# Patient Record
Sex: Female | Born: 1955 | Race: White | Hispanic: No | Smoking: Former smoker
Health system: Southern US, Community
[De-identification: ages and names within clinical notes are randomized; demographics above are authoritative.]

## PROBLEM LIST (undated history)

## (undated) DIAGNOSIS — E11610 Type 2 diabetes mellitus with diabetic neuropathic arthropathy: Secondary | ICD-10-CM

## (undated) DIAGNOSIS — F329 Major depressive disorder, single episode, unspecified: Secondary | ICD-10-CM

## (undated) DIAGNOSIS — I10 Essential (primary) hypertension: Secondary | ICD-10-CM

## (undated) DIAGNOSIS — Z800 Family history of malignant neoplasm of digestive organs: Secondary | ICD-10-CM

## (undated) DIAGNOSIS — F419 Anxiety disorder, unspecified: Secondary | ICD-10-CM

## (undated) DIAGNOSIS — Z860100 Personal history of colon polyps, unspecified: Secondary | ICD-10-CM

## (undated) DIAGNOSIS — Z8049 Family history of malignant neoplasm of other genital organs: Secondary | ICD-10-CM

## (undated) DIAGNOSIS — I639 Cerebral infarction, unspecified: Secondary | ICD-10-CM

## (undated) DIAGNOSIS — E785 Hyperlipidemia, unspecified: Secondary | ICD-10-CM

## (undated) DIAGNOSIS — F32A Depression, unspecified: Secondary | ICD-10-CM

## (undated) DIAGNOSIS — Z803 Family history of malignant neoplasm of breast: Secondary | ICD-10-CM

## (undated) DIAGNOSIS — J449 Chronic obstructive pulmonary disease, unspecified: Secondary | ICD-10-CM

## (undated) HISTORY — PX: COLONOSCOPY: SHX174

## (undated) HISTORY — DX: Family history of malignant neoplasm of breast: Z80.3

## (undated) HISTORY — PX: BILATERAL CARPAL TUNNEL RELEASE: SHX6508

## (undated) HISTORY — PX: ECTOPIC PREGNANCY SURGERY: SHX613

## (undated) HISTORY — DX: Chronic obstructive pulmonary disease, unspecified: J44.9

## (undated) HISTORY — DX: Depression, unspecified: F32.A

## (undated) HISTORY — DX: Major depressive disorder, single episode, unspecified: F32.9

## (undated) HISTORY — DX: Personal history of colonic polyps: Z86.010

## (undated) HISTORY — DX: Essential (primary) hypertension: I10

## (undated) HISTORY — DX: Family history of malignant neoplasm of digestive organs: Z80.0

## (undated) HISTORY — DX: Hyperlipidemia, unspecified: E78.5

## (undated) HISTORY — DX: Family history of malignant neoplasm of other genital organs: Z80.49

## (undated) HISTORY — DX: Type 2 diabetes mellitus with diabetic neuropathic arthropathy: E11.610

## (undated) HISTORY — DX: Anxiety disorder, unspecified: F41.9

## (undated) HISTORY — DX: Personal history of colon polyps, unspecified: Z86.0100

## (undated) HISTORY — DX: Cerebral infarction, unspecified: I63.9

---

## 2002-12-07 ENCOUNTER — Other Ambulatory Visit: Payer: Self-pay

## 2003-02-11 ENCOUNTER — Other Ambulatory Visit: Payer: Self-pay

## 2003-05-18 ENCOUNTER — Other Ambulatory Visit: Payer: Self-pay

## 2003-10-22 ENCOUNTER — Ambulatory Visit: Payer: Self-pay | Admitting: Anesthesiology

## 2003-11-29 ENCOUNTER — Ambulatory Visit: Payer: Self-pay | Admitting: Internal Medicine

## 2003-12-03 ENCOUNTER — Ambulatory Visit: Payer: Self-pay | Admitting: *Deleted

## 2003-12-05 ENCOUNTER — Ambulatory Visit: Payer: Self-pay | Admitting: Anesthesiology

## 2003-12-31 ENCOUNTER — Ambulatory Visit: Payer: Self-pay | Admitting: Anesthesiology

## 2004-01-16 ENCOUNTER — Ambulatory Visit: Payer: Self-pay | Admitting: Anesthesiology

## 2004-01-21 ENCOUNTER — Emergency Department: Payer: Self-pay | Admitting: General Practice

## 2004-01-28 ENCOUNTER — Ambulatory Visit: Payer: Self-pay | Admitting: Anesthesiology

## 2004-02-19 ENCOUNTER — Inpatient Hospital Stay: Payer: Self-pay | Admitting: Unknown Physician Specialty

## 2005-07-29 ENCOUNTER — Ambulatory Visit: Payer: Self-pay | Admitting: Nurse Practitioner

## 2005-10-27 ENCOUNTER — Ambulatory Visit: Payer: Self-pay | Admitting: Nurse Practitioner

## 2005-12-08 ENCOUNTER — Ambulatory Visit: Payer: Self-pay | Admitting: Gastroenterology

## 2006-02-16 ENCOUNTER — Ambulatory Visit: Payer: Self-pay | Admitting: Nurse Practitioner

## 2006-03-10 ENCOUNTER — Ambulatory Visit: Payer: Self-pay | Admitting: Nurse Practitioner

## 2006-05-24 ENCOUNTER — Emergency Department: Payer: Self-pay | Admitting: Emergency Medicine

## 2006-05-24 ENCOUNTER — Other Ambulatory Visit: Payer: Self-pay

## 2006-07-26 ENCOUNTER — Emergency Department: Payer: Self-pay | Admitting: Emergency Medicine

## 2006-07-26 ENCOUNTER — Other Ambulatory Visit: Payer: Self-pay

## 2006-07-28 ENCOUNTER — Ambulatory Visit: Payer: Self-pay | Admitting: Emergency Medicine

## 2007-06-20 ENCOUNTER — Emergency Department: Payer: Self-pay | Admitting: Internal Medicine

## 2007-10-01 ENCOUNTER — Emergency Department: Payer: Self-pay | Admitting: Emergency Medicine

## 2007-12-29 ENCOUNTER — Ambulatory Visit: Payer: Self-pay | Admitting: Family Medicine

## 2008-03-14 ENCOUNTER — Ambulatory Visit: Payer: Self-pay | Admitting: Neurology

## 2008-05-16 ENCOUNTER — Emergency Department: Payer: Self-pay | Admitting: Emergency Medicine

## 2009-11-27 ENCOUNTER — Ambulatory Visit: Payer: Self-pay | Admitting: Family Medicine

## 2009-12-17 ENCOUNTER — Emergency Department: Payer: Self-pay | Admitting: Emergency Medicine

## 2010-04-04 ENCOUNTER — Ambulatory Visit: Payer: Self-pay | Admitting: Anesthesiology

## 2010-04-29 ENCOUNTER — Emergency Department: Payer: Self-pay | Admitting: Emergency Medicine

## 2011-02-13 ENCOUNTER — Ambulatory Visit: Payer: Self-pay | Admitting: Family Medicine

## 2011-09-03 ENCOUNTER — Ambulatory Visit: Payer: Self-pay | Admitting: Family Medicine

## 2011-11-25 ENCOUNTER — Ambulatory Visit: Payer: Self-pay | Admitting: Family Medicine

## 2011-12-02 ENCOUNTER — Ambulatory Visit: Payer: Self-pay | Admitting: Family Medicine

## 2012-03-23 ENCOUNTER — Ambulatory Visit: Payer: Self-pay | Admitting: Family Medicine

## 2013-03-18 ENCOUNTER — Emergency Department: Payer: Self-pay | Admitting: Emergency Medicine

## 2013-05-16 ENCOUNTER — Ambulatory Visit: Payer: Self-pay | Admitting: Family Medicine

## 2013-08-14 ENCOUNTER — Emergency Department: Payer: Self-pay | Admitting: Emergency Medicine

## 2013-08-14 LAB — CBC
HCT: 41.9 % (ref 35.0–47.0)
HGB: 14 g/dL (ref 12.0–16.0)
MCH: 30.5 pg (ref 26.0–34.0)
MCHC: 33.4 g/dL (ref 32.0–36.0)
MCV: 91 fL (ref 80–100)
Platelet: 285 10*3/uL (ref 150–440)
RBC: 4.59 10*6/uL (ref 3.80–5.20)
RDW: 12.7 % (ref 11.5–14.5)
WBC: 7.3 10*3/uL (ref 3.6–11.0)

## 2013-08-14 LAB — TROPONIN I

## 2013-08-14 LAB — BASIC METABOLIC PANEL
Anion Gap: 4 — ABNORMAL LOW (ref 7–16)
BUN: 11 mg/dL (ref 7–18)
CALCIUM: 8.5 mg/dL (ref 8.5–10.1)
Chloride: 109 mmol/L — ABNORMAL HIGH (ref 98–107)
Co2: 28 mmol/L (ref 21–32)
Creatinine: 1.01 mg/dL (ref 0.60–1.30)
Glucose: 120 mg/dL — ABNORMAL HIGH (ref 65–99)
Osmolality: 282 (ref 275–301)
POTASSIUM: 4.2 mmol/L (ref 3.5–5.1)
Sodium: 141 mmol/L (ref 136–145)

## 2013-12-29 ENCOUNTER — Emergency Department: Payer: Self-pay | Admitting: Emergency Medicine

## 2014-07-02 ENCOUNTER — Encounter: Payer: Self-pay | Admitting: Cardiovascular Disease

## 2014-07-02 ENCOUNTER — Encounter (INDEPENDENT_AMBULATORY_CARE_PROVIDER_SITE_OTHER): Payer: Self-pay

## 2014-07-02 ENCOUNTER — Ambulatory Visit (INDEPENDENT_AMBULATORY_CARE_PROVIDER_SITE_OTHER): Payer: Medicare Other | Admitting: Cardiovascular Disease

## 2014-07-02 VITALS — BP 104/78 | HR 70 | Ht 64.5 in | Wt 203.5 lb

## 2014-07-02 DIAGNOSIS — R079 Chest pain, unspecified: Secondary | ICD-10-CM | POA: Diagnosis not present

## 2014-07-02 DIAGNOSIS — E785 Hyperlipidemia, unspecified: Secondary | ICD-10-CM | POA: Diagnosis not present

## 2014-07-02 DIAGNOSIS — I208 Other forms of angina pectoris: Secondary | ICD-10-CM | POA: Diagnosis not present

## 2014-07-02 DIAGNOSIS — I10 Essential (primary) hypertension: Secondary | ICD-10-CM | POA: Insufficient documentation

## 2014-07-02 DIAGNOSIS — I209 Angina pectoris, unspecified: Secondary | ICD-10-CM

## 2014-07-02 NOTE — Assessment & Plan Note (Signed)
Blood pressure is well controlled 

## 2014-07-02 NOTE — Patient Instructions (Addendum)
Medication Instructions:  Your physician recommends that you continue on your current medications as directed. Please refer to the Current Medication list given to you today.   Labwork: none  Testing/Procedures: Your physician has requested that you have a lexiscan myoview.   [place]  Your caregiver has ordered a Stress Test with nuclear imaging. The purpose of this test is to evaluate the blood supply to your heart muscle. This procedure is referred to as a "Non-Invasive Stress Test." This is because other than having an IV started in your vein, nothing is inserted or "invades" your body. Cardiac stress tests are done to find areas of poor blood flow to the heart by determining the extent of coronary artery disease (CAD). Some patients exercise on a treadmill, which naturally increases the blood flow to your heart, while others who are  unable to walk on a treadmill due to physical limitations have a pharmacologic/chemical stress agent called Lexiscan . This medicine will mimic walking on a treadmill by temporarily increasing your coronary blood flow.   Please note: these test may take anywhere between 2-4 hours to complete  PLEASE REPORT TO [place] TO GO  Date of Procedure:Thursday, June 23 at 8:30am Arrival Time for Procedure:  8:00am   PLEASE NOTIFY THE OFFICE AT LEAST 24 HOURS IN ADVANCE IF YOU ARE UNABLE TO KEEP YOUR APPOINTMENT.  [phone] AND  PLEASE NOTIFY NUCLEAR MEDICINE AT [facility] AT LEAST 24 HOURS IN ADVANCE IF YOU ARE UNABLE TO KEEP YOUR APPOINTMENT. [phone]  How to prepare for your Myoview test:   Do not eat or drink after midnight  No caffeine for 24 hours prior to test  No smoking 24 hours prior to test.  Your medication may be taken with water.  If your doctor stopped a medication because of this test, do not take that medication.  Ladies, please do not wear dresses.  Skirts or  pants are appropriate. Please wear a short sleeve shirt.  No perfume, cologne or lotion.  Wear comfortable walking shoes. No heels!            Follow-Up: Your physician recommends that you schedule a follow-up appointment in: one month with Dr. Fletcher Anon.    Any Other Special Instructions Will Be Listed Below (If Applicable).    Nuclear Medicine Exam A nuclear medicine exam is a safe and painless imaging test. It helps to detect and diagnose disease in the body as well as provide information about organ function and structure.  Nuclear scans are most often done of the:  Lungs.  Heart.  Thyroid gland.  Bones.  Abdomen. HOW A NUCLEAR MEDICINE EXAM WORKS A nuclear medicine exam works by using a radioactive tracer. The material is given either by an IV (intravenous) injection or it may be swallowed. After the tracer is in the body, it is absorbed by your body's organs. A large scanning machine that uses a special camera detects the radioactivity in your body. A computerized image is then formed regarding the area of concern. The small amounts of radioactive material used in a nuclear medicine exam are found to be medically safe. However, because radioactive material is used, this test is not done if you are pregnant or nursing.  BEFORE THE PROCEDURE  If available, bring previous imaging studies such as x-rays, etc. with you to the exam.  Arrive early for your exam. PROCEDURE  An IV may be started before  the exam begins.  Depending on the type of examination, will lie on a table or sit in a chair during the exam.  The nuclear medicine exam will take about 30 to 60 minutes to complete. AFTER THE PROCEDURE  After your scan is completed, the image(s) will be evaluated by a specialist. It is important that you follow up with your caregiver to find out your test results.  You may return to your regular activity as instructed by your caregiver. SEEK IMMEDIATE MEDICAL CARE  IF: You have shortness of breath or difficulty breathing. MAKE SURE YOU:   Understand these instructions.  Will watch your condition.  Will get help right away if you are not doing well or get worse. Document Released: 02/06/2004 Document Revised: 03/23/2011 Document Reviewed: 03/22/2008 [facility] Patient Information 2015 [place], [place]. This information is not intended to replace advice given to you by your health care provider. Make sure you discuss any questions you have with your health care provider.

## 2014-07-02 NOTE — Progress Notes (Signed)
Primary care physician: Dr. Lennox Grumbles  HPI  This is a pleasant 59 year old female who was referred for evaluation of chest pain. She has no previous cardiac history. However, she has multiple chronic medical conditions that include diet-controlled diabetes, tobacco use, COPD, hyperlipidemia, hypertension and GERD. She had previous right knee replacement. She smokes half a pack per day and has been doing so for at least 37 years. She has no family history of coronary artery disease. Over the last 1-2 months, she had experienced recurrent episodes of substernal chest pressure with no radiation. This mainly happens when she is doing regular activities such as working in her garden. It lasts for about 5-10 minutes and resolve with rest. She has no symptoms at rest. This has been associated with increased shortness of breath. No orthopnea, PND or lower extremity edema. She reports having a stress test many years ago but none recently.  No Known Allergies   No current outpatient prescriptions on file prior to visit.   No current facility-administered medications on file prior to visit.     Past Medical History  Diagnosis Date  . Hyperlipidemia   . Stroke   . Hypertension   . Anxiety   . Depression   . COPD (chronic obstructive pulmonary disease)   . 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 Ectopic pregnancy surgery    . Bilateral carpal tunnel release       Family History  Problem Relation Age of Onset  . Hypertension Mother   . Hyperlipidemia Mother      History   Social History  . Marital Status: Widowed    Spouse Name: N/A  . Number of Children: N/A  . Years of Education: N/A   Occupational History  . Not on file.   Social History Main Topics  . Smoking status: Current Every Day Smoker -- 0.50 packs/day for 37 years    Types: Cigarettes  . Smokeless tobacco: Not on file  . Alcohol Use: Yes   Comment: Rare.   . Drug Use: No  . Sexual Activity: Not on file   Other Topics Concern  . Not on file   Social History Narrative  . No narrative on file     ROS A 10 point review of system was performed. It is negative other than that mentioned in the history of present illness.   PHYSICAL EXAM   BP 104/78 mmHg  Pulse 70  Ht 5' 4.5" (1.638 m)  Wt 203 lb 8 oz (92.307 kg)  BMI 34.40 kg/m2 Constitutional: She is oriented to person, place, and time. She appears well-developed and well-nourished. No distress.  HENT: No nasal discharge.  Head: Normocephalic and atraumatic.  Eyes: Pupils are equal and round. No discharge.  Neck: Normal range of motion. Neck supple. No JVD present. No thyromegaly present.  Cardiovascular: Normal rate, regular rhythm, normal heart sounds. Exam reveals no gallop and no friction rub. No murmur heard.  Pulmonary/Chest: Effort normal and breath sounds normal. No stridor. No respiratory distress. She has no wheezes. She has no rales. She exhibits no tenderness.  Abdominal: Soft. Bowel sounds are normal. She exhibits no distension. There is no tenderness. There is no rebound and no guarding.  Musculoskeletal: Normal range of motion. She exhibits no edema and no tenderness.  Neurological: She is alert and oriented to person, place, and time. Coordination normal.  Skin: Skin is warm and dry. No rash  noted. She is not diaphoretic. No erythema. No pallor.  Psychiatric: She has a normal mood and affect. Her behavior is normal. Judgment and thought content normal.     EKG: Sinus  Rhythm  WITHIN NORMAL LIMITS   ASSESSMENT AND PLAN

## 2014-07-02 NOTE — Assessment & Plan Note (Signed)
Continue treatment with atorvastatin with a target LDL of less than 100 and if CAD is established, then recommend a target of less than 70.

## 2014-07-02 NOTE — Assessment & Plan Note (Signed)
The patient's symptoms of exertional chest pressure is highly suggestive of class III angina. She has multiple risk factors for coronary artery disease. Baseline ECG is normal. I recommended evaluation with a pharmacologic nuclear stress test. She is not able to fully exercise on a treadmill due to osteoarthritis and previous right knee replacement. Treatment with a beta blocker or long-acting nitroglycerin can be considered after getting the results of stress test. Continue low-dose aspirin.

## 2014-07-05 ENCOUNTER — Ambulatory Visit
Admission: RE | Admit: 2014-07-05 | Discharge: 2014-07-05 | Disposition: A | Discharge status: Home / Self Care | Payer: Medicare Other | Source: Ambulatory Visit | Attending: Cardiovascular Disease | Admitting: Cardiovascular Disease

## 2014-07-05 DIAGNOSIS — R079 Chest pain, unspecified: Secondary | ICD-10-CM | POA: Insufficient documentation

## 2014-07-05 LAB — NM MYOCAR MULTI W/SPECT W/WALL MOTION / EF
CHL CUP RESTING HR STRESS: 59 {beats}/min
LV dias vol: 55 mL
LV sys vol: 19 mL
Peak HR: 95 {beats}/min
Percent HR: 58 %
SDS: 2
SRS: 5
SSS: 3
TID: 0.9

## 2014-07-05 MED ORDER — TECHNETIUM TC 99M SESTAMIBI - CARDIOLITE
10.0000 | Freq: Once | INTRAVENOUS | Status: AC | PRN
  Administered 2014-07-05: 08:00:00 13.37 via INTRAVENOUS

## 2014-07-05 MED ORDER — TECHNETIUM TC 99M SESTAMIBI - CARDIOLITE
31.2000 | Freq: Once | INTRAVENOUS | Status: AC | PRN
  Administered 2014-07-05: 09:00:00 31.2 via INTRAVENOUS

## 2014-07-05 MED ORDER — REGADENOSON 0.4 MG/5ML IV SOLN
0.4000 mg | Freq: Once | INTRAVENOUS | Status: AC
  Administered 2014-07-05: 0.4 mg via INTRAVENOUS

## 2014-07-06 ENCOUNTER — Other Ambulatory Visit: Payer: Self-pay

## 2014-07-06 ENCOUNTER — Other Ambulatory Visit: Payer: Self-pay | Admitting: Family Medicine

## 2014-07-06 DIAGNOSIS — Z1231 Encounter for screening mammogram for malignant neoplasm of breast: Secondary | ICD-10-CM

## 2014-07-06 MED ORDER — ENALAPRIL MALEATE 10 MG PO TABS: 10.0000 mg | ORAL_TABLET | Freq: Every day | ORAL | Status: DC

## 2014-07-06 MED ORDER — CARVEDILOL 3.125 MG PO TABS: 3.1250 mg | ORAL_TABLET | Freq: Two times a day (BID) | ORAL | Status: DC

## 2014-07-09 ENCOUNTER — Ambulatory Visit
Admission: RE | Admit: 2014-07-09 | Discharge: 2014-07-09 | Disposition: A | Discharge status: Home / Self Care | Payer: Medicare Other | Source: Ambulatory Visit | Attending: Family Medicine | Admitting: Family Medicine

## 2014-07-09 DIAGNOSIS — Z1231 Encounter for screening mammogram for malignant neoplasm of breast: Secondary | ICD-10-CM | POA: Insufficient documentation

## 2014-08-09 ENCOUNTER — Ambulatory Visit: Payer: Medicare Other | Admitting: Cardiovascular Disease

## 2014-08-23 ENCOUNTER — Ambulatory Visit
Admission: RE | Admit: 2014-08-23 | Discharge: 2014-08-23 | Disposition: A | Discharge status: Home / Self Care | Payer: Medicare Other | Source: Ambulatory Visit | Attending: Family Medicine | Admitting: Family Medicine

## 2014-08-23 ENCOUNTER — Other Ambulatory Visit: Payer: Self-pay | Admitting: Family Medicine

## 2014-08-23 DIAGNOSIS — J449 Chronic obstructive pulmonary disease, unspecified: Secondary | ICD-10-CM

## 2014-08-23 DIAGNOSIS — M546 Pain in thoracic spine: Secondary | ICD-10-CM

## 2014-08-23 DIAGNOSIS — M549 Dorsalgia, unspecified: Secondary | ICD-10-CM | POA: Diagnosis present

## 2014-08-23 DIAGNOSIS — M5134 Other intervertebral disc degeneration, thoracic region: Secondary | ICD-10-CM | POA: Insufficient documentation

## 2014-09-17 ENCOUNTER — Emergency Department
Admission: EM | Admit: 2014-09-17 | Discharge: 2014-09-17 | Disposition: A | Discharge status: Home / Self Care | Payer: Medicare Other

## 2014-10-16 ENCOUNTER — Other Ambulatory Visit: Payer: Self-pay | Admitting: *Deleted

## 2014-10-16 MED ORDER — ENALAPRIL MALEATE 10 MG PO TABS: 10.0000 mg | ORAL_TABLET | Freq: Every day | ORAL | Status: DC

## 2015-03-11 ENCOUNTER — Telehealth: Payer: Self-pay | Admitting: Cardiovascular Disease

## 2015-03-11 NOTE — Telephone Encounter (Signed)
Lm with Sister about calling us back to make a fu

## 2015-05-07 ENCOUNTER — Ambulatory Visit (INDEPENDENT_AMBULATORY_CARE_PROVIDER_SITE_OTHER): Payer: Medicare Other

## 2015-05-07 ENCOUNTER — Ambulatory Visit (INDEPENDENT_AMBULATORY_CARE_PROVIDER_SITE_OTHER): Payer: Medicare Other | Admitting: Podiatry

## 2015-05-07 ENCOUNTER — Encounter: Payer: Self-pay | Admitting: Podiatry

## 2015-05-07 VITALS — BP 160/95 | HR 62 | Resp 18

## 2015-05-07 DIAGNOSIS — R52 Pain, unspecified: Secondary | ICD-10-CM | POA: Diagnosis not present

## 2015-05-07 DIAGNOSIS — M722 Plantar fascial fibromatosis: Secondary | ICD-10-CM

## 2015-05-07 DIAGNOSIS — L237 Allergic contact dermatitis due to plants, except food: Secondary | ICD-10-CM

## 2015-05-07 MED ORDER — METHYLPREDNISOLONE 4 MG PO TBPK: ORAL_TABLET | ORAL | Status: DC

## 2015-05-07 NOTE — Progress Notes (Signed)
   Subjective:    Patient ID: Jennifer Mosley, female    DOB: 12-Sep-1955, 60 y.o.   MRN: [id number]  HPI  60 year old female presents the office today for concerns of pain to both of her heels which is been ongoing for the last 2-3 months. She said that she has pain majority in the morning she first gets up if she is sitting up for sometime she gets back up. She denies any numbness or tingling. No swelling or redness. No recent injury or trauma. No previous treatment. No other complaints at this time.  She also states she has poison oak.    Review of Systems  All other systems reviewed and are negative.      Objective:   Physical Exam General: AAO x3, NAD  Dermatological: Erythematous lesions to bilateral feet which itch. She states that she has poison oak. Skin is warm, dry and supple bilateral. Nails x 10 are well manicured; remaining integument appears unremarkable at this time. There are no open sores, no preulcerative lesions, no rash or signs of infection present.  Vascular: Dorsalis Pedis artery and Posterior Tibial artery pedal pulses are 2/4 bilateral with immedate capillary fill time. Pedal hair growth present. No varicosities and no lower extremity edema present bilateral. There is no pain with calf compression, swelling, warmth, erythema.   Neruologic: Grossly intact via light touch bilateral. Vibratory intact via tuning fork bilateral. Protective threshold with Semmes Wienstein monofilament intact to all pedal sites bilateral. Patellar and Achilles deep tendon reflexes 2+ bilateral. No Babinski or clonus noted bilateral.   Musculoskeletal: Tenderness to palpation along the plantar medial tubercle of the calcaneus at the insertion of plantar fascia on the right > left foot. There is no pain along the course of the plantar fascia within the arch of the foot. Plantar fascia appears to be intact. There is no pain with lateral compression of the calcaneus or pain with vibratory  sensation. There is no pain along the course or insertion of the achilles tendon. No other areas of tenderness to bilateral lower extremities. MMT 5/5, ROM WNL Gait: Unassisted, Nonantalgic.      Assessment & Plan:  60 year old female bilateral heel pain right > left likely plantar fasciitis -Treatment options discussed including all alternatives, risks, and complications -Etiology of symptoms were discussed -X-rays were obtained and reviewed with the patient. No evidence of acute fracture or stress fracture.  -Patient elects to proceed with steroid injection into the left and right heel. Under sterile skin preparation, a total of 2.5cc of kenalog 10, 0.5% Marcaine plain, and 2% lidocaine plain were infiltrated into the symptomatic area without complication. A band-aid was applied. Patient tolerated the injection well without complication. Post-injection care with discussed with the patient. Discussed with the patient to ice the area over the next couple of days to help prevent a steroid flare.  -Plantar fascial brace bilaterally -Medrol dose pack -Ice -Stretching daily -Shoe gear changes -Follow-up in 3 weeks or sooner if any problems arise. In the meantime, encouraged to call the office with any questions, concerns, change in symptoms.   Celesta Gentile, DPM

## 2015-05-07 NOTE — Patient Instructions (Signed)

## 2015-05-16 ENCOUNTER — Emergency Department: Payer: Medicare Other

## 2015-05-16 ENCOUNTER — Inpatient Hospital Stay: Payer: Medicare Other

## 2015-05-16 ENCOUNTER — Inpatient Hospital Stay (HOSPITAL_COMMUNITY)
Admit: 2015-05-16 | Discharge: 2015-05-16 | Disposition: A | Discharge status: Home / Self Care | Payer: Medicare Other | Attending: Internal Medicine | Admitting: Internal Medicine

## 2015-05-16 ENCOUNTER — Encounter: Payer: Self-pay | Admitting: Emergency Medicine

## 2015-05-16 ENCOUNTER — Inpatient Hospital Stay
Admission: EM | Admit: 2015-05-16 | Discharge: 2015-05-17 | DRG: 069 | Disposition: A | Discharge status: Home / Self Care | Payer: Medicare Other | Attending: Internal Medicine | Admitting: Internal Medicine

## 2015-05-16 DIAGNOSIS — K219 Gastro-esophageal reflux disease without esophagitis: Secondary | ICD-10-CM | POA: Diagnosis present

## 2015-05-16 DIAGNOSIS — F329 Major depressive disorder, single episode, unspecified: Secondary | ICD-10-CM | POA: Diagnosis present

## 2015-05-16 DIAGNOSIS — R29898 Other symptoms and signs involving the musculoskeletal system: Secondary | ICD-10-CM

## 2015-05-16 DIAGNOSIS — F419 Anxiety disorder, unspecified: Secondary | ICD-10-CM | POA: Diagnosis present

## 2015-05-16 DIAGNOSIS — J449 Chronic obstructive pulmonary disease, unspecified: Secondary | ICD-10-CM | POA: Diagnosis present

## 2015-05-16 DIAGNOSIS — Z7952 Long term (current) use of systemic steroids: Secondary | ICD-10-CM | POA: Diagnosis not present

## 2015-05-16 DIAGNOSIS — E785 Hyperlipidemia, unspecified: Secondary | ICD-10-CM | POA: Diagnosis present

## 2015-05-16 DIAGNOSIS — Z803 Family history of malignant neoplasm of breast: Secondary | ICD-10-CM

## 2015-05-16 DIAGNOSIS — F17210 Nicotine dependence, cigarettes, uncomplicated: Secondary | ICD-10-CM | POA: Diagnosis present

## 2015-05-16 DIAGNOSIS — I10 Essential (primary) hypertension: Secondary | ICD-10-CM | POA: Diagnosis present

## 2015-05-16 DIAGNOSIS — R4781 Slurred speech: Secondary | ICD-10-CM | POA: Diagnosis not present

## 2015-05-16 DIAGNOSIS — Z7982 Long term (current) use of aspirin: Secondary | ICD-10-CM | POA: Diagnosis not present

## 2015-05-16 DIAGNOSIS — Z8249 Family history of ischemic heart disease and other diseases of the circulatory system: Secondary | ICD-10-CM

## 2015-05-16 DIAGNOSIS — M47812 Spondylosis without myelopathy or radiculopathy, cervical region: Secondary | ICD-10-CM | POA: Diagnosis present

## 2015-05-16 DIAGNOSIS — Z7902 Long term (current) use of antithrombotics/antiplatelets: Secondary | ICD-10-CM | POA: Diagnosis not present

## 2015-05-16 DIAGNOSIS — I6350 Cerebral infarction due to unspecified occlusion or stenosis of unspecified cerebral artery: Secondary | ICD-10-CM | POA: Diagnosis not present

## 2015-05-16 DIAGNOSIS — E1140 Type 2 diabetes mellitus with diabetic neuropathy, unspecified: Secondary | ICD-10-CM | POA: Diagnosis present

## 2015-05-16 DIAGNOSIS — Z79899 Other long term (current) drug therapy: Secondary | ICD-10-CM | POA: Diagnosis not present

## 2015-05-16 DIAGNOSIS — G459 Transient cerebral ischemic attack, unspecified: Secondary | ICD-10-CM | POA: Diagnosis present

## 2015-05-16 DIAGNOSIS — I639 Cerebral infarction, unspecified: Secondary | ICD-10-CM

## 2015-05-16 LAB — LIPID PANEL
CHOLESTEROL: 262 mg/dL — AB (ref 0–200)
HDL: 51 mg/dL (ref >40)
LDL Cholesterol: 184 mg/dL — ABNORMAL HIGH (ref 0–99)
Total CHOL/HDL Ratio: 5.1 RATIO
Triglycerides: 135 mg/dL (ref <150)
VLDL: 27 mg/dL (ref 0–40)

## 2015-05-16 LAB — COMPREHENSIVE METABOLIC PANEL
ALT: 200 U/L — ABNORMAL HIGH (ref 14–54)
ANION GAP: 9 (ref 5–15)
AST: 112 U/L — AB (ref 15–41)
Albumin: 4.1 g/dL (ref 3.5–5.0)
Alkaline Phosphatase: 148 U/L — ABNORMAL HIGH (ref 38–126)
BUN: 9 mg/dL (ref 6–20)
CHLORIDE: 105 mmol/L (ref 101–111)
CO2: 26 mmol/L (ref 22–32)
Calcium: 9.2 mg/dL (ref 8.9–10.3)
Creatinine, Ser: 0.99 mg/dL (ref 0.44–1.00)
Glucose, Bld: 109 mg/dL — ABNORMAL HIGH (ref 65–99)
POTASSIUM: 3.9 mmol/L (ref 3.5–5.1)
Sodium: 140 mmol/L (ref 135–145)
Total Bilirubin: 0.6 mg/dL (ref 0.3–1.2)
Total Protein: 7.6 g/dL (ref 6.5–8.1)

## 2015-05-16 LAB — CBC
HCT: 46.3 % (ref 35.0–47.0)
Hemoglobin: 15.8 g/dL (ref 12.0–16.0)
MCH: 31 pg (ref 26.0–34.0)
MCHC: 34.2 g/dL (ref 32.0–36.0)
MCV: 90.7 fL (ref 80.0–100.0)
PLATELETS: 216 10*3/uL (ref 150–440)
RBC: 5.11 MIL/uL (ref 3.80–5.20)
RDW: 12.9 % (ref 11.5–14.5)
WBC: 7.6 10*3/uL (ref 3.6–11.0)

## 2015-05-16 MED ORDER — ACETAMINOPHEN 325 MG PO TABS
650.0000 mg | ORAL_TABLET | Freq: Four times a day (QID) | ORAL | Status: DC | PRN
  Administered 2015-05-16: 650 mg via ORAL
  Filled 2015-05-16: qty 2

## 2015-05-16 MED ORDER — ACETAMINOPHEN 650 MG RE SUPP: 650.0000 mg | Freq: Four times a day (QID) | RECTAL | Status: DC | PRN

## 2015-05-16 MED ORDER — ASPIRIN 81 MG PO CHEW
324.0000 mg | CHEWABLE_TABLET | Freq: Once | ORAL | Status: AC
  Administered 2015-05-16: 324 mg via ORAL

## 2015-05-16 MED ORDER — ENALAPRIL MALEATE 5 MG PO TABS
10.0000 mg | ORAL_TABLET | Freq: Every day | ORAL | Status: DC
  Administered 2015-05-17: 08:00:00 10 mg via ORAL
  Filled 2015-05-16: qty 2

## 2015-05-16 MED ORDER — CARVEDILOL 3.125 MG PO TABS: 3.1250 mg | ORAL_TABLET | Freq: Two times a day (BID) | ORAL | Status: DC

## 2015-05-16 MED ORDER — HYDROCODONE-ACETAMINOPHEN 5-325 MG PO TABS
1.0000 | ORAL_TABLET | ORAL | Status: DC | PRN
  Administered 2015-05-17: 1 via ORAL
  Filled 2015-05-16: qty 1

## 2015-05-16 MED ORDER — ONDANSETRON HCL 4 MG/2ML IJ SOLN: 4.0000 mg | Freq: Four times a day (QID) | INTRAMUSCULAR | Status: DC | PRN

## 2015-05-16 MED ORDER — IOPAMIDOL (ISOVUE-370) INJECTION 76%
100.0000 mL | Freq: Once | INTRAVENOUS | Status: AC | PRN
  Administered 2015-05-16: 100 mL via INTRAVENOUS

## 2015-05-16 MED ORDER — ATORVASTATIN CALCIUM 20 MG PO TABS: 80.0000 mg | ORAL_TABLET | Freq: Every day | ORAL | Status: DC

## 2015-05-16 MED ORDER — ASPIRIN 81 MG PO CHEW
CHEWABLE_TABLET | ORAL | Status: AC
  Filled 2015-05-16: qty 4

## 2015-05-16 MED ORDER — ZOLPIDEM TARTRATE 5 MG PO TABS
5.0000 mg | ORAL_TABLET | Freq: Every evening | ORAL | Status: DC | PRN
  Filled 2015-05-16: qty 1

## 2015-05-16 MED ORDER — ONDANSETRON HCL 4 MG PO TABS: 4.0000 mg | ORAL_TABLET | Freq: Four times a day (QID) | ORAL | Status: DC | PRN

## 2015-05-16 MED ORDER — SENNOSIDES-DOCUSATE SODIUM 8.6-50 MG PO TABS: 1.0000 | ORAL_TABLET | Freq: Every evening | ORAL | Status: DC | PRN

## 2015-05-16 MED ORDER — TIOTROPIUM BROMIDE MONOHYDRATE 18 MCG IN CAPS
18.0000 ug | ORAL_CAPSULE | Freq: Every day | RESPIRATORY_TRACT | Status: DC
  Filled 2015-05-16: qty 5

## 2015-05-16 MED ORDER — ENOXAPARIN SODIUM 40 MG/0.4ML ~~LOC~~ SOLN
40.0000 mg | SUBCUTANEOUS | Status: DC
  Administered 2015-05-16: 40 mg via SUBCUTANEOUS
  Filled 2015-05-16: qty 0.4

## 2015-05-16 MED ORDER — PANTOPRAZOLE SODIUM 40 MG PO TBEC
40.0000 mg | DELAYED_RELEASE_TABLET | Freq: Every day | ORAL | Status: DC
  Administered 2015-05-17: 40 mg via ORAL
  Filled 2015-05-16: qty 1

## 2015-05-16 MED ORDER — CLOPIDOGREL BISULFATE 75 MG PO TABS
75.0000 mg | ORAL_TABLET | Freq: Every day | ORAL | Status: DC
  Administered 2015-05-16 – 2015-05-17 (×2): 75 mg via ORAL
  Filled 2015-05-16 (×2): qty 1

## 2015-05-16 NOTE — H&P (Signed)
[facility] [place] NAME: Jennifer Mosley    MR#:  [id number]  DATE OF BIRTH:  1955-07-17  DATE OF ADMISSION:  05/16/2015  PRIMARY CARE PHYSICIAN: [name], MD   REQUESTING/REFERRING PHYSICIAN:Dr Joni Fears  CHIEF COMPLAINT:   Slurred speech and facial droop HISTORY OF PRESENT ILLNESS:  Jennifer Mosley  is a 60 y.o. female with a known history of CVA, essential hypertension and anxiety presents with above complaint. Patient reports that approximately 11:00 this afternoon she was not feeling very well and she had an episode of nausea. She went outside to sit down and was trying to mow the lawn. At 1:00 her sister noticed facial droop and slurred speech. EMS was called and she was brought to the ER for further evaluation. When she arrived in the emergency room she still has slurred speech and facial droop. The symptoms have now resolved. She is complaining of a slight headache.  PAST MEDICAL HISTORY:   Past Medical History  Diagnosis Date  . Stroke ([place])   . Hypertension   . Anxiety   . Depression   . COPD (chronic obstructive pulmonary disease) ([place])   . 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 Hyperlipidemia   . Essential hypertension     PAST SURGICAL HISTORY:   Past Surgical History  Procedure Laterality Date  . Ectopic pregnancy surgery    . Bilateral carpal tunnel release      SOCIAL HISTORY:   Social History  Substance Use Topics  . Smoking status: Current Every Day Smoker -- 0.50 packs/day for 37 years    Types: Cigarettes  . Smokeless tobacco: No  . Alcohol Use: YesOccasionally on the weekends      Comment: Rare.     FAMILY HISTORY:   Family History  Problem Relation Age of Onset  . Hypertension Mother   . Hyperlipidemia Mother   . Breast cancer Cousin     DRUG ALLERGIES:  No Known Allergies  REVIEW OF SYSTEMS:   Review of Systems  Constitutional: Negative for fever, chills  and malaise/fatigue.  HENT: Negative for ear discharge, ear pain, hearing loss, nosebleeds and sore throat.   Eyes: Negative for blurred vision and pain.  Respiratory: Negative for cough, hemoptysis, shortness of breath and wheezing.   Cardiovascular: Negative for chest pain, palpitations and leg swelling.  Gastrointestinal: Negative for nausea, vomiting, abdominal pain, diarrhea and blood in stool.  Genitourinary: Negative for dysuria.  Musculoskeletal: Negative for back pain.  Neurological: Positive for speech change, focal weakness and headaches. Negative for dizziness, tremors and seizures.  Endo/Heme/Allergies: Does not bruise/bleed easily.  Psychiatric/Behavioral: Negative for depression, suicidal ideas and hallucinations.    MEDICATIONS AT HOME:   Prior to Admission medications   Medication Sig Start Date End Date Taking? Authorizing Provider  aspirin 81 MG tablet Take 81 mg by mouth daily.    Historical Provider, MD  atorvastatin (LIPITOR) 80 MG tablet Take 80 mg by mouth daily.    Historical Provider, MD  carvedilol (COREG) 3.125 MG tablet Take 1 tablet (3.125 mg total) by mouth 2 (two) times daily with a meal. 07/06/14   Wellington Hampshire, MD  cetirizine (ZYRTEC) 10 MG tablet Take 10 mg by mouth daily.    Historical Provider, MD  enalapril (VASOTEC) 10 MG tablet Take 1 tablet (10 mg total) by mouth daily. 10/16/14   Wellington Hampshire, MD  esomeprazole (NEXIUM) 40 MG capsule Take 40 mg by mouth  daily at 12 noon.    Historical Provider, MD  methylPREDNISolone (MEDROL DOSEPAK) 4 MG TBPK tablet Take as directed 05/07/15   Trula Slade, DPM  tiotropium (SPIRIVA) 18 MCG inhalation capsule Place 18 mcg into inhaler and inhale daily.    Historical Provider, MD      VITAL SIGNS:  Blood pressure 138/78, pulse 64, temperature 98.4 F (36.9 C), temperature source Oral, resp. rate 19, height 5\' 4"  (1.626 m), weight 89.359 kg (197 lb), SpO2 96 %.  PHYSICAL EXAMINATION:   Physical Exam   Constitutional: She is oriented to person, place, and time and well-developed, well-nourished, and in no distress. No distress.  HENT:  Head: Normocephalic.  Eyes: No scleral icterus.  Neck: Normal range of motion. Neck supple. No JVD present. No tracheal deviation present.  Cardiovascular: Normal rate, regular rhythm and normal heart sounds.  Exam reveals no gallop and no friction rub.   No murmur heard. Pulmonary/Chest: Effort normal and breath sounds normal. No respiratory distress. She has no wheezes. She has no rales. She exhibits no tenderness.  Abdominal: Soft. Bowel sounds are normal. She exhibits no distension and no mass. There is no tenderness. There is no rebound and no guarding.  Musculoskeletal: Normal range of motion. She exhibits no edema.  Left lower extremity 4-5  Neurological: She is alert and oriented to person, place, and time.  Skin: Skin is warm. No rash noted. No erythema.  Psychiatric: Affect and judgment normal.      LABORATORY PANEL:   CBC  Recent Labs Lab 05/16/15 1452  WBC 7.6  HGB 15.8  HCT 46.3  PLT 216   ------------------------------------------------------------------------------------------------------------------  Chemistries   Recent Labs Lab 05/16/15 1452  NA 140  K 3.9  CL 105  CO2 26  GLUCOSE 109*  BUN 9  CREATININE 0.99  CALCIUM 9.2  AST 112*  ALT 200*  ALKPHOS 148*  BILITOT 0.6   ------------------------------------------------------------------------------------------------------------------  Cardiac Enzymes No results for input(s): TROPONINI in the last 168 hours. ------------------------------------------------------------------------------------------------------------------  RADIOLOGY:  Ct Head Wo Contrast  05/16/2015  CLINICAL DATA:  Left-sided weakness for about 4 hours with slurred speech for 2 hours, symptoms have resolved but patient remains week EXAM: CT HEAD WITHOUT CONTRAST TECHNIQUE: Contiguous axial  images were obtained from the base of the skull through the vertex without intravenous contrast. COMPARISON:  05/24/2006 FINDINGS: There is no evidence of acute vascular territory infarct. There is no evidence of mass. There is no hydrocephalus. There is no hemorrhage or extra-axial fluid. Calvarium is intact. IMPRESSION: No acute abnormalities by CT. MRI may be considered to evaluate further if indicated. Critical Value/emergent results were called by telephone at the time of interpretation on 05/16/2015 at 3:19 pm to Dr. Lavonia Drafts , who verbally acknowledged these results. Electronically Signed   By: Skipper Cliche M.D.   On: 05/16/2015 15:18    EKG:   Sinus rhythm no ST elevation or depression.  IMPRESSION AND PLAN:    60 year old female with a history of stroke, essential hypertension and tobacco abuse who presents with slurred speech and left facial droop that has subsequently resolved.  1. Slurred speech with left facial droop: Symptoms are consistent with TIA. Patient will be admitted to hospital service to evaluate for stroke. MRI, carotid Dopplers and echocardiogram have been ordered. Aspirin has been changed to Plavix. Continue statin and check lipid panel. Neurology consultation has been placed.  Neuro checks every 4 hours. PT, OT and speech consultation.  2. Essential hypertension: Continue  Coreg and enalapril.  3. Hyperlipidemia: Continue atorvastatin and check lipid panel.   4. GERD: Continue PPI.  5. Tobacco dependence: Patient is strongly encouraged to stop smoking. Patient says she is trying to quit smoking. Patient counseled for 3 minutes. At this time she does not 1 nicotine patch.    All the records are reviewed and case discussed with ED provider. Management plans discussed with the patient and shein agreement  CODE STATUS: FULL  TOTAL TIME TAKING CARE OF THIS PATIENT: 50 minutes.    Donnetta Gillin M.D on 05/16/2015 at 4:25 PM  Between 7am to 6pm - Pager -  [phone]  After 6pm go to [email or web address] - password [email or web address] [facility]  Office  [phone]  CC: Primary care physician; [name], MD

## 2015-05-16 NOTE — Progress Notes (Signed)
*  PRELIMINARY RESULTS* Echocardiogram 2D Echocardiogram has been performed. A complete Echocardiogram was performed, test ordered incorrectly.   [place]ber 05/16/2015, 7:29 PM

## 2015-05-16 NOTE — ED Notes (Signed)
Pt states she threw up around 11am today and began feeling weakness at 11:30am.  Family members noticed stroke-like symptoms, including weakness, slurred speech, and Left facial draw at approximately 1300.  EMS reports that when fire department arrived to scene, the patient's stroke screen was negative.  On ED arrival, patient's weakness remained, other stroke symptoms had resolved.  Code stroke called.

## 2015-05-16 NOTE — Consult Note (Signed)
Referring Physician: Archie Balboa    Chief Complaint: Facial droop and weakness  HPI: Jennifer Mosley is an 60 y.o. female who began to feel bad earlier today around 1130am and had an episode of vomiting.  Then later in the afternoon around 1p she developed some slurred speech and facial droop.  She also experienced generalized weakness.  EMS was called.  When EMS arrived she had improved and her speech and facial droop was back to baseline.  Initial NIHSS of 1.  Date last known well: 05/16/2015 Time last known well: Time: 13:00 tPA Given: No: Improvement in symptoms  MRankin: 0  Past Medical History  Diagnosis Date  . Stroke ([place])   . Hypertension   . Anxiety   . Depression   . COPD (chronic obstructive pulmonary disease) ([place])   . 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 Hyperlipidemia   . Essential hypertension     Past Surgical History  Procedure Laterality Date  . Ectopic pregnancy surgery    . Bilateral carpal tunnel release      Family History  Problem Relation Age of Onset  . Hypertension Mother   . Hyperlipidemia Mother   . Breast cancer Cousin    Social History:  reports that she has been smoking Cigarettes.  She has a 18.5 pack-year smoking history. She does not have any smokeless tobacco history on file. She reports that she drinks alcohol. She reports that she does not use illicit drugs.  Allergies: No Known Allergies  Medications: I have reviewed the patient's current medications. Prior to Admission:  Prior to Admission medications   Medication Sig Start Date End Date Taking? Authorizing Provider  aspirin 81 MG tablet Take 81 mg by mouth daily.    Historical Provider, MD  atorvastatin (LIPITOR) 80 MG tablet Take 80 mg by mouth daily.    Historical Provider, MD  carvedilol (COREG) 3.125 MG tablet Take 1 tablet (3.125 mg total) by mouth 2 (two) times daily with a meal. 07/06/14   Wellington Hampshire, MD  cetirizine (ZYRTEC) 10 MG tablet Take  10 mg by mouth daily.    Historical Provider, MD  enalapril (VASOTEC) 10 MG tablet Take 1 tablet (10 mg total) by mouth daily. 10/16/14   Wellington Hampshire, MD  esomeprazole (NEXIUM) 40 MG capsule Take 40 mg by mouth daily at 12 noon.    Historical Provider, MD  methylPREDNISolone (MEDROL DOSEPAK) 4 MG TBPK tablet Take as directed 05/07/15   Trula Slade, DPM  tiotropium (SPIRIVA) 18 MCG inhalation capsule Place 18 mcg into inhaler and inhale daily.    Historical Provider, MD    ROS: History obtained from the patient  General ROS: negative for - chills, fatigue, fever, night sweats, weight gain or weight loss Psychological ROS: negative for - behavioral disorder, hallucinations, memory difficulties, mood swings or suicidal ideation Ophthalmic ROS: negative for - blurry vision, double vision, eye pain or loss of vision ENT ROS: negative for - epistaxis, nasal discharge, oral lesions, sore throat, tinnitus or vertigo Allergy and Immunology ROS: negative for - hives or itchy/watery eyes Hematological and Lymphatic ROS: negative for - bleeding problems, bruising or swollen lymph nodes Endocrine ROS: negative for - galactorrhea, hair pattern changes, polydipsia/polyuria or temperature intolerance Respiratory ROS: negative for - cough, hemoptysis, shortness of breath or wheezing Cardiovascular ROS: negative for - chest pain, dyspnea on exertion, edema or irregular heartbeat Gastrointestinal ROS: as noted in HPI Genito-Urinary ROS: negative for - dysuria, hematuria, incontinence  or urinary frequency/urgency Musculoskeletal ROS: negative for - joint swelling or muscular weakness Neurological ROS: as noted in HPI Dermatological ROS: negative for rash and skin lesion changes  Physical Examination: Blood pressure 138/78, pulse 64, temperature 98.4 F (36.9 C), temperature source Oral, resp. rate 19, height 5\' 4"  (1.626 m), weight 89.359 kg (197 lb), SpO2 96 %.  HEENT-  Normocephalic, no lesions,  without obvious abnormality.  Normal external eye and conjunctiva.  Normal TM's bilaterally.  Normal auditory canals and external ears. Normal external nose, mucus membranes and septum.  Normal pharynx. Cardiovascular- S1, S2 normal, pulses palpable throughout   Lungs- chest clear, no wheezing, rales, normal symmetric air entry Abdomen- soft, non-tender; bowel sounds normal; no masses,  no organomegaly Extremities- no edema Lymph-no adenopathy palpable Musculoskeletal-no joint tenderness, deformity or swelling Skin-warm and dry, no hyperpigmentation, vitiligo, or suspicious lesions  Neurological Examination Mental Status: Alert, oriented, thought content appropriate.  Speech fluent without evidence of aphasia.  Able to follow 3 step commands without difficulty. Cranial Nerves: II: Discs flat bilaterally; Visual fields grossly normal, pupils equal, round, reactive to light and accommodation III,IV, VI: ptosis not present, extra-ocular motions intact bilaterally V,VII: smile symmetric, facial light touch sensation normal bilaterally VIII: hearing normal bilaterally IX,X: gag reflex present XI: bilateral shoulder shrug XII: midline tongue extension Motor: Right : Upper extremity   5/5    Left:     Upper extremity   5/5  Lower extremity   5/5     Lower extremity   5-/5 Tone and bulk:normal tone throughout; no atrophy noted Sensory: Pinprick and light touch decreased in LLE Deep Tendon Reflexes: 2+ and symmetric throughout Plantars: Right: downgoing   Left: downgoing Cerebellar: Normal finger-to-nose and normal heel-to-shin testing bilaterally Gait: not tested due to safety concerns     Laboratory Studies:  Basic Metabolic Panel:  Recent Labs Lab 05/16/15 1452  NA 140  K 3.9  CL 105  CO2 26  GLUCOSE 109*  BUN 9  CREATININE 0.99  CALCIUM 9.2    Liver Function Tests:  Recent Labs Lab 05/16/15 1452  AST 112*  ALT 200*  ALKPHOS 148*  BILITOT 0.6  PROT 7.6  ALBUMIN  4.1   No results for input(s): LIPASE, AMYLASE in the last 168 hours. No results for input(s): AMMONIA in the last 168 hours.  CBC:  Recent Labs Lab 05/16/15 1452  WBC 7.6  HGB 15.8  HCT 46.3  MCV 90.7  PLT 216    Cardiac Enzymes: No results for input(s): CKTOTAL, CKMB, CKMBINDEX, TROPONINI in the last 168 hours.  BNP: Invalid input(s): POCBNP  CBG: No results for input(s): GLUCAP in the last 168 hours.  Microbiology: No results found for this or any previous visit.  Coagulation Studies: No results for input(s): LABPROT, INR in the last 72 hours.  Urinalysis: No results for input(s): COLORURINE, LABSPEC, PHURINE, GLUCOSEU, HGBUR, BILIRUBINUR, KETONESUR, PROTEINUR, UROBILINOGEN, NITRITE, LEUKOCYTESUR in the last 168 hours.  Invalid input(s): APPERANCEUR  Lipid Panel: No results found for: CHOL, TRIG, HDL, CHOLHDL, VLDL, LDLCALC  HgbA1C: No results found for: HGBA1C  Urine Drug Screen:  No results found for: LABOPIA, COCAINSCRNUR, LABBENZ, AMPHETMU, THCU, LABBARB  Alcohol Level: No results for input(s): ETH in the last 168 hours.  Other results: EKG: sinus rhythm at 69 bpm.  Imaging: Ct Head Wo Contrast  05/16/2015  CLINICAL DATA:  Left-sided weakness for about 4 hours with slurred speech for 2 hours, symptoms have resolved but patient remains week EXAM: CT  HEAD WITHOUT CONTRAST TECHNIQUE: Contiguous axial images were obtained from the base of the skull through the vertex without intravenous contrast. COMPARISON:  05/24/2006 FINDINGS: There is no evidence of acute vascular territory infarct. There is no evidence of mass. There is no hydrocephalus. There is no hemorrhage or extra-axial fluid. Calvarium is intact. IMPRESSION: No acute abnormalities by CT. MRI may be considered to evaluate further if indicated. Critical Value/emergent results were called by telephone at the time of interpretation on 05/16/2015 at 3:19 pm to Dr. Lavonia Drafts , who verbally acknowledged these  results. Electronically Signed   By: Skipper Cliche M.D.   On: 05/16/2015 15:18    Assessment: 60 y.o. female presenting with complaints of slurred speech and facial droop that have resolved.  Now with minor LLE symptoms therefore not a tPA or intervention candidate.  Due to initial symptoms of vomiting will view posterior circulation.  Head CT personally reviewed and shows no acute changes.  Patient on ASA at home.  Further work up recommended.  Would continue to follow neuro checks as well, since patient remains in the window.      Stroke Risk Factors - diabetes mellitus, hyperlipidemia, hypertension and smoking  Plan: 1. HgbA1c, fasting lipid panel 2. CTA of the head and neck 3. PT consult, OT consult, Speech consult 4. Echocardiogram 5. MRI of the brain without contrast 6. Prophylactic therapy-Antiplatelet med: Aspirin - dose 325mg  today.  Plavix 75mg  daily starting tomorrow.   7. NPO until RN stroke swallow screen 8. Telemetry monitoring 9. Frequent neuro checks 10. Smoking cessation counseling   Alexis Goodell, MD Neurology [phone] 05/16/2015, 4:20 PM

## 2015-05-16 NOTE — ED Notes (Signed)
Handoff given to [place], Therapist, sports.

## 2015-05-16 NOTE — ED Provider Notes (Signed)
[facility] Emergency Department Provider Note    ____________________________________________  Time seen: ~1545  I have reviewed the triage vital signs and the nursing notes.   HISTORY  Chief Complaint Code Stroke   History limited by: Not Limited   HPI Jennifer Mosley is a 60 y.o. female who has history of CVA, hypertension, COPD, diabetes who presents to the emergency department today after a concerning episode of slurred speech and facial droop. He patient states that when she woke up this morning she started becoming nauseous. She states she did feel nauseous and had some vomiting in the morning. She then went and tried to help him of the lawn. When she was stung mowing the lawn and she noticed that the right side of her face was pulling. She was also noted to have slurred speech that time. Patient denied any concurrent headache or chest pain. She states that since the episode started she feels like her speech has gotten better. She has noticed some left leg weakness as well. He states she has had similar symptoms with her previous strokes. No recent fevers or other illness.   Past Medical History  Diagnosis Date  . Stroke ([place])   . Hypertension   . Anxiety   . Depression   . COPD (chronic obstructive pulmonary disease) ([place])   . 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 Hyperlipidemia   . Essential hypertension     Patient Active Problem List   Diagnosis Date Noted  . Angina, class III ([place]) 07/02/2014  . Hyperlipidemia   . Essential hypertension     Past Surgical History  Procedure Laterality Date  . Ectopic pregnancy surgery    . Bilateral carpal tunnel release      Current Outpatient Rx  Name  Route  Sig  Dispense  Refill  . aspirin 81 MG tablet   Oral   Take 81 mg by mouth daily.         Marland Kitchen atorvastatin (LIPITOR) 80 MG tablet   Oral   Take 80 mg by mouth daily.         . carvedilol (COREG) 3.125 MG  tablet   Oral   Take 1 tablet (3.125 mg total) by mouth 2 (two) times daily with a meal.   60 tablet   5   . cetirizine (ZYRTEC) 10 MG tablet   Oral   Take 10 mg by mouth daily.         . enalapril (VASOTEC) 10 MG tablet   Oral   Take 1 tablet (10 mg total) by mouth daily.   90 tablet   3   . esomeprazole (NEXIUM) 40 MG capsule   Oral   Take 40 mg by mouth daily at 12 noon.         . methylPREDNISolone (MEDROL DOSEPAK) 4 MG TBPK tablet      Take as directed   21 tablet   0   . tiotropium (SPIRIVA) 18 MCG inhalation capsule   Inhalation   Place 18 mcg into inhaler and inhale daily.           Allergies Review of patient's allergies indicates no known allergies.  Family History  Problem Relation Age of Onset  . Hypertension Mother   . Hyperlipidemia Mother   . Breast cancer Cousin     Social History Social History  Substance Use Topics  . Smoking status: Current Every Day Smoker -- 0.50 packs/day for 37 years  Types: Cigarettes  . Smokeless tobacco: Not on file  . Alcohol Use: Yes     Comment: Rare.     Review of Systems  Constitutional: Negative for fever. Cardiovascular: Negative for chest pain. Respiratory: Negative for shortness of breath. Gastrointestinal: Negative for abdominal pain. Positive for nausea and vomiting Neurological: Negative for headaches, focal weakness or numbness.  10-point ROS otherwise negative.  ____________________________________________   PHYSICAL EXAM:  VITAL SIGNS:    97.9 F (36.6 C)  66  16   158/123 mmHg  98 %     Constitutional: Alert and oriented. Slightly anxious. Tearful Eyes: Conjunctivae are normal. PERRL. Normal extraocular movements. ENT   Head: Normocephalic and atraumatic.   Nose: No congestion/rhinnorhea.   Mouth/Throat: Mucous membranes are moist.   Neck: No stridor. Hematological/Lymphatic/Immunilogical: No cervical lymphadenopathy. Cardiovascular: Normal rate, regular  rhythm.  No murmurs, rubs, or gallops. Respiratory: Normal respiratory effort without tachypnea nor retractions. Breath sounds are clear and equal bilaterally. No wheezes/rales/rhonchi. Gastrointestinal: Soft and nontender. No distention. There is no CVA tenderness. Genitourinary: Deferred Musculoskeletal: Normal range of motion in all extremities. No joint effusions.  No lower extremity tenderness nor edema. Neurologic:  Normal speech and language. No obvious facial asymmetry. Strength 5 out of 5 in upper extremities. Strength 4 out of 5 in the left lower extremity. 5 out of 5 in the right lower extremity. Sensation grossly intact.  Skin:  Skin is warm, dry and intact. No rash noted. Psychiatric: Mood and affect are normal. Speech and behavior are normal. Patient exhibits appropriate insight and judgment.  ____________________________________________    LABS (pertinent positives/negatives)  Labs Reviewed  COMPREHENSIVE METABOLIC PANEL - Abnormal; Notable for the following:    Glucose, Bld 109 (*)    AST 112 (*)    ALT 200 (*)    Alkaline Phosphatase 148 (*)    All other components within normal limits  CBC     ____________________________________________   EKG  I, Nance Pear, attending physician, personally viewed and interpreted this EKG  EKG Time: 1443 Rate: 69 Rhythm: normal sinus rhythm Axis: left axis deviation Intervals: qtc 464 QRS: LVH, narrow ST changes: no st elevation Impression: abnormal ekg ____________________________________________    RADIOLOGY  CT head IMPRESSION: No acute abnormalities by CT. MRI may be considered to evaluate further if indicated.  ____________________________________________   PROCEDURES  Procedure(s) performed: None  Critical Care performed: Yes, see critical care note(s)  CRITICAL CARE Performed by: Nance Pear   Total critical care time: 20 minutes  Critical care time was exclusive of separately  billable procedures and treating other patients.  Critical care was necessary to treat or prevent imminent or life-threatening deterioration.  Critical care was time spent personally by me on the following activities: development of treatment plan with patient and/or surrogate as well as nursing, discussions with consultants, evaluation of patient's response to treatment, examination of patient, obtaining history from patient or surrogate, ordering and performing treatments and interventions, ordering and review of laboratory studies, ordering and review of radiographic studies, pulse oximetry and re-evaluation of patient's condition.  ____________________________________________   INITIAL IMPRESSION / ASSESSMENT AND PLAN / ED COURSE  Pertinent labs & imaging results that were available during my care of the patient were reviewed by me and considered in my medical decision making (see chart for details).  Patient presented to the emergency department today as a code stroke. She had symptoms of slurred speech and facial droop. On exam no appreciable facial asymmetry or slurred speech.  She did have slight decrease in strength in her left lower leg. She states that that has been affected by her previous strokes. Head CT without any acute bleed or other findings. Patient was found by neurology who recommended admission for TIA workup. At this point TPA was withheld given low score on NIHSS  ____________________________________________   FINAL CLINICAL IMPRESSION(S) / ED DIAGNOSES  Final diagnoses:  Slurred speech  Transient cerebral ischemia, unspecified transient cerebral ischemia type     Nance Pear, MD 05/16/15 1606

## 2015-05-17 ENCOUNTER — Inpatient Hospital Stay: Payer: Medicare Other

## 2015-05-17 DIAGNOSIS — G459 Transient cerebral ischemic attack, unspecified: Principal | ICD-10-CM

## 2015-05-17 LAB — HEMOGLOBIN A1C: Hgb A1c MFr Bld: 5.9 % (ref 4.0–6.0)

## 2015-05-17 LAB — ECHOCARDIOGRAM LIMITED
HEIGHTINCHES: 64 in
WEIGHTICAEL: 3152 [oz_av]

## 2015-05-17 MED ORDER — ATORVASTATIN CALCIUM 80 MG PO TABS: 80.0000 mg | ORAL_TABLET | Freq: Every day | ORAL | Status: DC

## 2015-05-17 MED ORDER — CLOPIDOGREL BISULFATE 75 MG PO TABS: 75.0000 mg | ORAL_TABLET | Freq: Every day | ORAL | Status: DC

## 2015-05-17 NOTE — Discharge Summary (Signed)
Jennifer Mosley, is a 60 y.o. female  DOB 1955/04/10  MRN [id number].  Admission date:  05/16/2015  Admitting Physician  Jennifer Costa, MD  Discharge Date:  05/17/2015   Primary MD  Jennifer Merles, MD  Recommendations for primary care physician for things to follow:   Follow-up with primary doctor in 1 week   Admission Diagnosis  Slurred speech [R47.81] CVA (cerebral infarction) [I63.9] Left leg weakness [R29.898] Transient cerebral ischemia, unspecified transient cerebral ischemia type [G45.9]   Discharge Diagnosis  Slurred speech [R47.81] CVA (cerebral infarction) [I63.9] Left leg weakness [R29.898] Transient cerebral ischemia, unspecified transient cerebral ischemia type [G45.9]    Active Problems:   Stroke (cerebrum) [facility])      Past Medical History  Diagnosis Date  . Stroke ([place])   . Hypertension   . Anxiety   . Depression   . COPD (chronic obstructive pulmonary disease) ([place])   . 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 Hyperlipidemia   . Essential hypertension     Past Surgical History  Procedure Laterality Date  . Ectopic pregnancy surgery    . Bilateral carpal tunnel release         History of present illness and  Hospital Course:     Kindly see H&P for history of present illness and admission details, please review complete Labs, Consult reports and Test reports for all details in brief  HPI  from the history and physical done on the day of admission  60 year old female patient admitted because of left facial droop and slurred speech. Patient initial CAT scan of the head is negative. Admitted for evaluation of TIA.  Hospital Course   Transient slurred speech, facial droop: Patient initial CAT scan of the head is negative. Her the slurred speech and facial droop resolved.  Waiting for MRI of the brain, carotid ultrasound. Echocardiogram showed EF more than 65%. Seen by neurology. Started on aspirin, Plavix,  And patient to has no further neurological deficit. She is eager to go home. We will wait for all the tests  and neurology recommendation before we discharge her. No atrial fibrillation on the monitor. And had a CT angiogram head which did not show any intracranial stenosis.  #2 essential hypertension: Patient is on Coreg ,lisinopril #3 history of COPD, tobacco abuse: Patient is on Spiriva continue that ,no wheezing today. Advised to quit smoking. #4 hyperlipidemia with LDL 184.but elevated AST<ALT,so had  ultrasound abdomen done pmd,but I don't see the results. Hold statins at this time.      Discharge Condition: Stable   Follow UP      Discharge Instructions  and  Discharge Medications        Medication List    TAKE these medications        aspirin 81 MG tablet  Take 81 mg by mouth daily.     atorvastatin 80 MG tablet  Commonly known as:  LIPITOR  Take 1 tablet (80 mg total) by mouth daily.     clopidogrel 75 MG tablet  Commonly known as:  PLAVIX  Take 1 tablet (75 mg total) by mouth daily.     enalapril 10 MG tablet  Commonly known as:  VASOTEC  Take 1 tablet (10 mg total) by mouth daily.     esomeprazole 40 MG capsule  Commonly known as:  NEXIUM  Take 40 mg by mouth daily at 12 noon.     fenoprofen 600 MG Tabs tablet  Commonly known as:  NALFON  Take 600 mg by mouth daily.          Diet and Activity recommendation: See Discharge Instructions above   Consults obtained - Neurology   Major procedures and Radiology Reports - PLEASE review detailed and final reports for all details, in brief -      Ct Angio Head W/cm &/or Wo Cm  05/16/2015  CLINICAL DATA:  History of slurred speech and facial droop earlier today. Symptoms have now resolved. EXAM: CT ANGIOGRAPHY HEAD AND NECK TECHNIQUE: Multidetector CT imaging of  the head and neck was performed using the standard protocol during bolus administration of intravenous contrast. Multiplanar CT image reconstructions and MIPs were obtained to evaluate the vascular anatomy. Carotid stenosis measurements (when applicable) are obtained utilizing NASCET criteria, using the distal internal carotid diameter as the denominator. CONTRAST:  Isovue 370, 100 mL. COMPARISON:  CT head earlier today. FINDINGS: CT HEAD Calvarium and skull base: No fracture or destructive lesion. Mastoids and middle ears are grossly clear. Paranasal sinuses: Imaged portions are clear. Orbits: Negative. Brain: No evidence of acute abnormality, including acute infarct, hemorrhage, hydrocephalus, or mass lesion. Normal cerebral volume. No white matter disease. Incidental pineal calcification. CTA NECK Aortic arch: Standard branching. Imaged portion shows no evidence of aneurysm or dissection. No significant stenosis of the major arch vessel origins. Right carotid system: Minor calcific plaque at the bifurcation. No evidence of dissection, stenosis (50% or greater) or occlusion. Left carotid system: Minor calcific plaque at the bifurcation. No evidence of dissection, stenosis (50% or greater) or occlusion. Vertebral arteries: LEFT dominant. No evidence of dissection, stenosis (50% or greater) or occlusion. Nonvascular soft tissues: Cervical spondylosis. No neck masses. Lung apices clear. CTA HEAD Anterior circulation: Minor skullbase cavernous ICA calcification. No significant stenosis, proximal occlusion, aneurysm, or vascular malformation. Posterior circulation: LEFT vertebral dominant. BILATERAL distal vertebral calcification. Diminutive contribution RIGHT vertebral to the basilar. No significant stenosis, proximal occlusion, aneurysm, or vascular malformation. Venous sinuses: As permitted by contrast timing, patent. Anatomic variants: None of significance. Delayed phase:   No abnormal intracranial enhancement.  IMPRESSION: No extracranial or intracranial flow reducing stenosis or occlusion. No abnormal postcontrast enhancement or other visible acute intracranial findings. Electronically Signed   By: Staci Righter M.D.   On: 05/16/2015 17:17   Ct Head Wo Contrast  05/16/2015  CLINICAL DATA:  Left-sided weakness for about 4 hours with slurred speech for 2 hours, symptoms have resolved but patient remains week EXAM: CT HEAD WITHOUT CONTRAST TECHNIQUE: Contiguous axial images were obtained from the base of the skull through the vertex without intravenous contrast. COMPARISON:  05/24/2006 FINDINGS: There is no evidence of acute vascular territory infarct. There is no evidence of mass. There is no hydrocephalus. There is no hemorrhage or extra-axial fluid. Calvarium is intact. IMPRESSION: No acute abnormalities by CT. MRI may be considered to evaluate further if indicated. Critical Value/emergent results were called by telephone at the time of interpretation on 05/16/2015 at 3:19 pm to Dr. Lavonia Drafts , who verbally acknowledged these results. Electronically Signed   By: Skipper Cliche M.D.   On: 05/16/2015 15:18   Ct Angio Neck W/cm &/or Wo/cm  05/16/2015  CLINICAL DATA:  History of slurred speech and facial droop earlier today. Symptoms have now resolved. EXAM: CT ANGIOGRAPHY HEAD AND NECK TECHNIQUE: Multidetector CT imaging of the head and neck was performed using the standard protocol during bolus administration of intravenous contrast. Multiplanar CT image reconstructions and MIPs were obtained to evaluate the vascular  anatomy. Carotid stenosis measurements (when applicable) are obtained utilizing NASCET criteria, using the distal internal carotid diameter as the denominator. CONTRAST:  Isovue 370, 100 mL. COMPARISON:  CT head earlier today. FINDINGS: CT HEAD Calvarium and skull base: No fracture or destructive lesion. Mastoids and middle ears are grossly clear. Paranasal sinuses: Imaged portions are clear. Orbits:  Negative. Brain: No evidence of acute abnormality, including acute infarct, hemorrhage, hydrocephalus, or mass lesion. Normal cerebral volume. No white matter disease. Incidental pineal calcification. CTA NECK Aortic arch: Standard branching. Imaged portion shows no evidence of aneurysm or dissection. No significant stenosis of the major arch vessel origins. Right carotid system: Minor calcific plaque at the bifurcation. No evidence of dissection, stenosis (50% or greater) or occlusion. Left carotid system: Minor calcific plaque at the bifurcation. No evidence of dissection, stenosis (50% or greater) or occlusion. Vertebral arteries: LEFT dominant. No evidence of dissection, stenosis (50% or greater) or occlusion. Nonvascular soft tissues: Cervical spondylosis. No neck masses. Lung apices clear. CTA HEAD Anterior circulation: Minor skullbase cavernous ICA calcification. No significant stenosis, proximal occlusion, aneurysm, or vascular malformation. Posterior circulation: LEFT vertebral dominant. BILATERAL distal vertebral calcification. Diminutive contribution RIGHT vertebral to the basilar. No significant stenosis, proximal occlusion, aneurysm, or vascular malformation. Venous sinuses: As permitted by contrast timing, patent. Anatomic variants: None of significance. Delayed phase:   No abnormal intracranial enhancement. IMPRESSION: No extracranial or intracranial flow reducing stenosis or occlusion. No abnormal postcontrast enhancement or other visible acute intracranial findings. Electronically Signed   By: Staci Righter M.D.   On: 05/16/2015 17:17    Micro Results    No results found for this or any previous visit (from the past 240 hour(s)).     Today   Subjective:   Brylea Moscoso today has no headache,no chest abdominal pain,no new weakness tingling or numbness, feels much better wants to go home today.   Objective:   Blood pressure 103/65, pulse 52, temperature 98 F (36.7 C),  temperature source Oral, resp. rate 20, height 5\' 4"  (1.626 m), weight 89.359 kg (197 lb), SpO2 97 %.  No intake or output data in the 24 hours ending 05/17/15 0905  Exam Awake Alert, Oriented x 3, No new F.N deficits, Normal affect [place].AT,PERRAL Supple Neck,No JVD, No cervical lymphadenopathy appriciated.  Symmetrical Chest wall movement, Good air movement bilaterally, CTAB RRR,No Gallops,Rubs or new Murmurs, No Parasternal Heave +ve B.Sounds, Abd Soft, Non tender, No organomegaly appriciated, No rebound -guarding or rigidity. No Cyanosis, Clubbing or edema, No new Rash or bruise  Data Review   CBC w Diff:  Lab Results  Component Value Date   WBC 7.6 05/16/2015   WBC 7.3 08/14/2013   HGB 15.8 05/16/2015   HGB 14.0 08/14/2013   HCT 46.3 05/16/2015   HCT 41.9 08/14/2013   PLT 216 05/16/2015   PLT 285 08/14/2013    CMP:  Lab Results  Component Value Date   NA 140 05/16/2015   NA 141 08/14/2013   K 3.9 05/16/2015   K 4.2 08/14/2013   CL 105 05/16/2015   CL 109* 08/14/2013   CO2 26 05/16/2015   CO2 28 08/14/2013   BUN 9 05/16/2015   BUN 11 08/14/2013   CREATININE 0.99 05/16/2015   CREATININE 1.01 08/14/2013   PROT 7.6 05/16/2015   ALBUMIN 4.1 05/16/2015   BILITOT 0.6 05/16/2015   ALKPHOS 148* 05/16/2015   AST 112* 05/16/2015   ALT 200* 05/16/2015  .   Total Time in preparing paper work,  data evaluation and todays exam - 13 minutes  Niambi Smoak M.D on 05/17/2015 at 9:05 AM    Note: This dictation was prepared with Dragon dictation along with smaller phrase technology. Any transcriptional errors that result from this process are unintentional.

## 2015-05-17 NOTE — Progress Notes (Signed)
Subjective: Patient reports that weakness is better and she is having no further numbness.    Objective: Current vital signs: BP 103/65 mmHg  Pulse 52  Temp(Src) 98 F (36.7 C) (Oral)  Resp 20  Ht 5\' 4"  (1.626 m)  Wt 89.359 kg (197 lb)  BMI 33.80 kg/m2  SpO2 97% Vital signs in last 24 hours: Temp:  [97.5 F (36.4 C)-98.4 F (36.9 C)] 98 F (36.7 C) (05/05 0434) Pulse Rate:  [52-71] 52 (05/05 0434) Resp:  [15-24] 20 (05/05 0434) BP: (103-158)/(65-123) 103/65 mmHg (05/05 0434) SpO2:  [95 %-99 %] 97 % (05/05 0434) Weight:  [89.359 kg (197 lb)] 89.359 kg (197 lb) (05/04 1520)  Intake/Output from previous day:   Intake/Output this shift: Total I/O In: 240 [P.O.:240] Out: -  Nutritional status: Diet Heart Room service appropriate?: Yes; Fluid consistency:: Thin  Neurologic Exam: Mental Status: Alert, oriented, thought content appropriate.  Speech fluent without evidence of aphasia.  Able to follow 3 step commands without difficulty. Cranial Nerves: II: Discs flat bilaterally; Visual fields grossly normal, pupils equal, round, reactive to light and accommodation III,IV, VI: ptosis not present, extra-ocular motions intact bilaterally V,VII: smile symmetric, facial light touch sensation normal bilaterally VIII: hearing normal bilaterally IX,X: gag reflex present XI: bilateral shoulder shrug XII: midline tongue extension Motor: Right : Upper extremity   5/5    Left:     Upper extremity   5/5  Lower extremity   5/5     Lower extremity   5/5 Tone and bulk:normal tone throughout; no atrophy noted Sensory: Pinprick and light touch intact throughout, bilaterally Cerebellar: Normal heel-to-shin testing bilaterally   Lab Results: Basic Metabolic Panel:  Recent Labs Lab 05/16/15 1452  NA 140  K 3.9  CL 105  CO2 26  GLUCOSE 109*  BUN 9  CREATININE 0.99  CALCIUM 9.2    Liver Function Tests:  Recent Labs Lab 05/16/15 1452  AST 112*  ALT 200*  ALKPHOS 148*   BILITOT 0.6  PROT 7.6  ALBUMIN 4.1   No results for input(s): LIPASE, AMYLASE in the last 168 hours. No results for input(s): AMMONIA in the last 168 hours.  CBC:  Recent Labs Lab 05/16/15 1452  WBC 7.6  HGB 15.8  HCT 46.3  MCV 90.7  PLT 216    Cardiac Enzymes: No results for input(s): CKTOTAL, CKMB, CKMBINDEX, TROPONINI in the last 168 hours.  Lipid Panel:  Recent Labs Lab 05/16/15 1452  CHOL 262*  TRIG 135  HDL 51  CHOLHDL 5.1  VLDL 27  LDLCALC 184*    CBG: No results for input(s): GLUCAP in the last 168 hours.  Microbiology: No results found for this or any previous visit.  Coagulation Studies: No results for input(s): LABPROT, INR in the last 72 hours.  Imaging: Ct Angio Head W/cm &/or Wo Cm  05/16/2015  CLINICAL DATA:  History of slurred speech and facial droop earlier today. Symptoms have now resolved. EXAM: CT ANGIOGRAPHY HEAD AND NECK TECHNIQUE: Multidetector CT imaging of the head and neck was performed using the standard protocol during bolus administration of intravenous contrast. Multiplanar CT image reconstructions and MIPs were obtained to evaluate the vascular anatomy. Carotid stenosis measurements (when applicable) are obtained utilizing NASCET criteria, using the distal internal carotid diameter as the denominator. CONTRAST:  Isovue 370, 100 mL. COMPARISON:  CT head earlier today. FINDINGS: CT HEAD Calvarium and skull base: No fracture or destructive lesion. Mastoids and middle ears are grossly clear. Paranasal sinuses: Imaged  portions are clear. Orbits: Negative. Brain: No evidence of acute abnormality, including acute infarct, hemorrhage, hydrocephalus, or mass lesion. Normal cerebral volume. No white matter disease. Incidental pineal calcification. CTA NECK Aortic arch: Standard branching. Imaged portion shows no evidence of aneurysm or dissection. No significant stenosis of the major arch vessel origins. Right carotid system: Minor calcific plaque  at the bifurcation. No evidence of dissection, stenosis (50% or greater) or occlusion. Left carotid system: Minor calcific plaque at the bifurcation. No evidence of dissection, stenosis (50% or greater) or occlusion. Vertebral arteries: LEFT dominant. No evidence of dissection, stenosis (50% or greater) or occlusion. Nonvascular soft tissues: Cervical spondylosis. No neck masses. Lung apices clear. CTA HEAD Anterior circulation: Minor skullbase cavernous ICA calcification. No significant stenosis, proximal occlusion, aneurysm, or vascular malformation. Posterior circulation: LEFT vertebral dominant. BILATERAL distal vertebral calcification. Diminutive contribution RIGHT vertebral to the basilar. No significant stenosis, proximal occlusion, aneurysm, or vascular malformation. Venous sinuses: As permitted by contrast timing, patent. Anatomic variants: None of significance. Delayed phase:   No abnormal intracranial enhancement. IMPRESSION: No extracranial or intracranial flow reducing stenosis or occlusion. No abnormal postcontrast enhancement or other visible acute intracranial findings. Electronically Signed   By: Staci Righter M.D.   On: 05/16/2015 17:17   Ct Head Wo Contrast  05/16/2015  CLINICAL DATA:  Left-sided weakness for about 4 hours with slurred speech for 2 hours, symptoms have resolved but patient remains week EXAM: CT HEAD WITHOUT CONTRAST TECHNIQUE: Contiguous axial images were obtained from the base of the skull through the vertex without intravenous contrast. COMPARISON:  05/24/2006 FINDINGS: There is no evidence of acute vascular territory infarct. There is no evidence of mass. There is no hydrocephalus. There is no hemorrhage or extra-axial fluid. Calvarium is intact. IMPRESSION: No acute abnormalities by CT. MRI may be considered to evaluate further if indicated. Critical Value/emergent results were called by telephone at the time of interpretation on 05/16/2015 at 3:19 pm to Dr. Lavonia Drafts ,  who verbally acknowledged these results. Electronically Signed   By: Skipper Cliche M.D.   On: 05/16/2015 15:18   Ct Angio Neck W/cm &/or Wo/cm  05/16/2015  CLINICAL DATA:  History of slurred speech and facial droop earlier today. Symptoms have now resolved. EXAM: CT ANGIOGRAPHY HEAD AND NECK TECHNIQUE: Multidetector CT imaging of the head and neck was performed using the standard protocol during bolus administration of intravenous contrast. Multiplanar CT image reconstructions and MIPs were obtained to evaluate the vascular anatomy. Carotid stenosis measurements (when applicable) are obtained utilizing NASCET criteria, using the distal internal carotid diameter as the denominator. CONTRAST:  Isovue 370, 100 mL. COMPARISON:  CT head earlier today. FINDINGS: CT HEAD Calvarium and skull base: No fracture or destructive lesion. Mastoids and middle ears are grossly clear. Paranasal sinuses: Imaged portions are clear. Orbits: Negative. Brain: No evidence of acute abnormality, including acute infarct, hemorrhage, hydrocephalus, or mass lesion. Normal cerebral volume. No white matter disease. Incidental pineal calcification. CTA NECK Aortic arch: Standard branching. Imaged portion shows no evidence of aneurysm or dissection. No significant stenosis of the major arch vessel origins. Right carotid system: Minor calcific plaque at the bifurcation. No evidence of dissection, stenosis (50% or greater) or occlusion. Left carotid system: Minor calcific plaque at the bifurcation. No evidence of dissection, stenosis (50% or greater) or occlusion. Vertebral arteries: LEFT dominant. No evidence of dissection, stenosis (50% or greater) or occlusion. Nonvascular soft tissues: Cervical spondylosis. No neck masses. Lung apices clear. CTA HEAD Anterior circulation: Minor  skullbase cavernous ICA calcification. No significant stenosis, proximal occlusion, aneurysm, or vascular malformation. Posterior circulation: LEFT vertebral dominant.  BILATERAL distal vertebral calcification. Diminutive contribution RIGHT vertebral to the basilar. No significant stenosis, proximal occlusion, aneurysm, or vascular malformation. Venous sinuses: As permitted by contrast timing, patent. Anatomic variants: None of significance. Delayed phase:   No abnormal intracranial enhancement. IMPRESSION: No extracranial or intracranial flow reducing stenosis or occlusion. No abnormal postcontrast enhancement or other visible acute intracranial findings. Electronically Signed   By: Staci Righter M.D.   On: 05/16/2015 17:17   Mr Brain Wo Contrast  05/17/2015  CLINICAL DATA:  Facial droop and slurred speech yesterday. Prior stroke. EXAM: MRI HEAD WITHOUT CONTRAST TECHNIQUE: Multiplanar, multiecho pulse sequences of the brain and surrounding structures were obtained without intravenous contrast. COMPARISON:  Head CT and CTA 05/16/2015 FINDINGS: There is no evidence of acute infarct, intracranial hemorrhage, mass, midline shift, or extra-axial fluid collection. Ventricles and sulci are normal. The brain is normal in signal for age. Orbits are unremarkable. Trace secretions are present in the left sphenoid sinus. The mastoid air cells are clear. Major intracranial vascular flow voids are preserved. IMPRESSION: Unremarkable appearance of the brain. Electronically Signed   By: Logan Bores M.D.   On: 05/17/2015 12:08   US Carotid Bilateral  05/17/2015  CLINICAL DATA:  60 year old female with symptoms of stroke EXAM: BILATERAL CAROTID DUPLEX ULTRASOUND TECHNIQUE: Pearline Cables scale imaging, color Doppler and duplex ultrasound were performed of bilateral carotid and vertebral arteries in the neck. COMPARISON:  CTA of the head and neck performed concurrently. FINDINGS: Criteria: Quantification of carotid stenosis is based on velocity parameters that correlate the residual internal carotid diameter with NASCET-based stenosis levels, using the diameter of the distal internal carotid lumen as the  denominator for stenosis measurement. The following velocity measurements were obtained: RIGHT ICA:  92/26 cm/sec CCA:  [id number] cm/sec SYSTOLIC ICA/CCA RATIO:  1.0 DIASTOLIC ICA/CCA RATIO:  1.3 ECA:  98 cm/sec LEFT ICA:  70/19 cm/sec CCA:  [id number] cm/sec SYSTOLIC ICA/CCA RATIO:  0.8 DIASTOLIC ICA/CCA RATIO:  0.8 ECA:  107 cm/sec RIGHT CAROTID ARTERY: Mild heterogeneous atherosclerotic plaque in the proximal internal carotid artery. By peak systolic velocity criteria the estimated stenosis remains less than 50%. The internal carotid artery is tortuous distally. RIGHT VERTEBRAL ARTERY:  Patent with normal antegrade flow. LEFT CAROTID ARTERY: Mild heterogeneous atherosclerotic plaque in the proximal internal and external carotid arteries. By peak systolic velocity criteria the estimated stenosis remains less than 50%. The internal carotid artery is tortuous distally. LEFT VERTEBRAL ARTERY:  Patent with normal antegrade flow. IMPRESSION: 1. Mild (1-49%) stenosis proximal right internal carotid artery secondary to heterogenous atherosclerotic plaque. 2. Mild (1-49%) stenosis proximal left internal carotid artery secondary to heterogenous atherosclerotic plaque. 3. Vertebral arteries are patent with normal antegrade flow. Signed, Criselda Peaches, MD [facility] Electronically Signed   By: Jacqulynn Cadet M.D.   On: 05/17/2015 10:45    Medications:  I have reviewed the patient's current medications. Scheduled: . carvedilol  3.125 mg Oral BID WC  . clopidogrel  75 mg Oral Daily  . enalapril  10 mg Oral Daily  . enoxaparin (LOVENOX) injection  40 mg Subcutaneous Q24H  . pantoprazole  40 mg Oral Daily  . tiotropium  18 mcg Inhalation Daily    Assessment/Plan: Patient reports being back to baseline.  MRI of the brain personally reviewed and shows no acute changes. TIA suspected.  Patient started on Plavix.  Was on ASA at home.  Carotid dopplers show no  evidence of hemodynamically significant stenosis.  Echocardiogram shows no cardiac source of emboli with an EF of 60%.  A1c 5.9, LDL 184.  Recommendations: 1.  Agree with continued Plavix 2.  Aggressive lipid management with target LDL of less than 70 3.  Smoking cessation stressed 4.  No further neurologic intervention is recommended at this time.  If further questions arise, please call or page at that time.  Thank you for allowing neurology to participate in the care of this patient.  Patient to follow up with neurology on an outpatient basis.      LOS: 1 day   Alexis Goodell, MD Neurology [phone] 05/17/2015  12:15 PM

## 2015-05-17 NOTE — Care Management (Signed)
Presented to this facility with the diagnosis of stroke. Lives with husband, [place], and granddaughter Gwenyth Bouillon. Last seen Dr. Delight Stare either Wednesday or Thursday  of last week. [facility] a few years back following a stroke. No skilled nursing facility. Uses no aids for ambulation. No falls. Good appetite. Takes care of all basic and instrumental activities of daily living herself, drives. Sister or husband will transport. Shelbie Ammons RN MSN CCM Care Management [phone]

## 2015-05-17 NOTE — Progress Notes (Signed)
Pt is alert and oriented x 4, ambulatory in room, fair appetite, denies pain, NIH of 1, MRi negative, neuro rounded on patient. Pt d/c to home and recommended to f/u with pcp in 1 week, rx for plavix sent in to pharmacy. Reviewed d/c instructions with patient including risk factors for stroke,pt verbalized understanding of d/c instructions but resistant to life style changes. Sister present at bedside during education. Pt pushed to visitor entrance via nursing staff, uneventful shift.

## 2015-05-17 NOTE — Care Management Important Message (Signed)
Important Message  Patient Details  Name: Jennifer Mosley MRN: [id number] Date of Birth: 08-Nov-1955   Medicare Important Message Given:  Yes    Shelbie Ammons, RN 05/17/2015, 11:15 AM

## 2015-05-17 NOTE — Progress Notes (Signed)
PT Cancellation Note  Patient Details Name: Jennifer Mosley MRN: [id number] DOB: 1955/06/20   Cancelled Treatment:    Reason Eval/Treat Not Completed: Patient at procedure or test/unavailable. Pt re-attempted to see pt again, and she is still out for testing. PT will f/u at a later time and complete evaluation when appropriate.    Neoma Laming, PT, DPT  05/17/2015, 11:46 AM [phone]

## 2015-05-17 NOTE — Progress Notes (Signed)
Notified Dr. Vianne Bulls via telephone thatr MRi is negative and neuro has entered in notes. Per MD pat can be discharged to home.

## 2015-05-17 NOTE — Progress Notes (Signed)
PT Cancellation Note  Patient Details Name: Cintya Forgie MRN: [id number] DOB: Feb 08, 1955   Cancelled Treatment:    Reason Eval/Treat Not Completed: Patient at procedure or test/unavailable. Pt's chart reviewed and discussed pt with RN. Pt is currently gone for an MRI and other tests and isn't available at this time. PT will f/u at a later time and complete evaluation.    Neoma Laming, PT, DPT  05/17/2015, 10:16 AM [phone]

## 2015-05-17 NOTE — Evaluation (Signed)
Occupational Therapy Evaluation Patient Details Name: Jennifer Mosley MRN: [id number] DOB: October 20, 1955 Today's Date: 05/17/2015    History of Present Illness  Pt. Is a 59 y.o. Female who was admitted to [facility] for slurred speech, and facial droop.   Clinical Impression   Pt. Is a 60 y.o. Female who was admitted to [facility] with facial droop, and slurred speech. Pt. Is back to her baseline functioning with ADLs. Pt. Presents with no UE limitations, or ADL needs warranting further OT intervention at this time. OT services are discharged at this time, at this level of care.    Follow Up Recommendations  No OT follow up    Equipment Recommendations       Recommendations for Other Services       Precautions / Restrictions Precautions Precautions: None      Mobility Bed Mobility  Independent                Transfers    Independent                  Balance  Good unsupported dynamic sitting balance      Good unsupported standing balance                                    ADL  Pt. Is Independent with ADLs.                                             Vision  Intact, No change from baseline   Perception     Praxis      Pertinent Vitals/Pain Pain Assessment: 0-10 Pain Score: 0-No pain     Hand Dominance     Extremity/Trunk Assessment Upper Extremity Assessment Upper Extremity Assessment: Overall WFL for tasks assessed (Intact sensation to light touch and proprioceptive awareness)           Communication     Cognition                           General Comments       Exercises       Shoulder Instructions      Home Living Family/patient expects to be discharged to:: Private residence Living Arrangements: Spouse/significant other Available Help at Discharge: Family Type of Home: House Home Access: Stairs to enter Technical brewer of Steps: 5-6   Home Layout: One level     Bathroom  Shower/Tub: Walk-in shower;Door   [employer] Toilet: Standard     Home Equipment: None          Prior Functioning/Environment               OT Diagnosis: Generalized weakness   OT Problem List:     OT Treatment/Interventions:      OT Goals(Current goals can be found in the care plan section) Acute Rehab OT Goals Patient Stated Goal: To go home. Potential to Achieve Goals: Good  OT Frequency:     Barriers to D/C:            Co-evaluation              End of Session Equipment Utilized During Treatment: Gait belt  Activity Tolerance: Patient tolerated treatment well Patient left: in bed;with call bell/phone within  reach   Time:  -  9:30-9:55   Charges:  OT General Charges $OT Visit: 1 Procedure OT Evaluation $OT Eval Low Complexity: 1 Procedure G-Codes:    Harrel Carina, MS, OTR/L Harrel Carina 05/17/2015, 10:33 AM

## 2015-05-18 NOTE — Progress Notes (Signed)
Patient called unit concerning her rx being sent to the wrong pharmacy. Rx printed and faxed to [facility] in [place] at patients request.

## 2015-05-28 ENCOUNTER — Ambulatory Visit (INDEPENDENT_AMBULATORY_CARE_PROVIDER_SITE_OTHER): Payer: Medicare Other | Admitting: Podiatry

## 2015-05-28 ENCOUNTER — Encounter: Payer: Self-pay | Admitting: Podiatry

## 2015-05-28 DIAGNOSIS — I639 Cerebral infarction, unspecified: Secondary | ICD-10-CM

## 2015-05-28 DIAGNOSIS — M722 Plantar fascial fibromatosis: Secondary | ICD-10-CM | POA: Insufficient documentation

## 2015-05-28 NOTE — Progress Notes (Signed)
Patient ID: Jennifer Mosley, female   DOB: 04-Apr-1955, 60 y.o.   MRN: [id number]  Subjective: 60 year old female presents the office they for follow-up evaluation of bilateral heel pain. She states that she is doing much better than she was last appointment although she still some discomfort at times her left heel but not bad. She has been stretching icing intermittently. However since last appointment she was in the hospital for TIA. She's also had other medical conditions going on which of limited her from treating her feet. Denies any systemic complaints such as fevers, chills, nausea, vomiting. No acute changes since last appointment, and no other complaints at this time.   Objective: AAO x3, NAD DP/PT pulses palpable bilaterally, CRT less than 3 seconds There mild, but significantly improved tenderness to palpation along the plantar medial tubercle of the calcaneus at the insertion of plantar fascia on the left > right foot. There is no pain along the course of the plantar fascia within the arch of the foot. Plantar fascia appears to be intact. There is no pain with lateral compression of the calcaneus or pain with vibratory sensation. There is no pain along the course or insertion of the achilles tendon. No other areas of tenderness to bilateral lower extremities. No areas of pinpoint bony tenderness or pain with vibratory sensation. MMT 5/5, ROM WNL. No edema, erythema, increase in warmth to bilateral lower extremities.  No open lesions or pre-ulcerative lesions.  No pain with calf compression, swelling, warmth, erythema  Assessment: Resolving heel pain likely plantar fasciitis left worse than right  Plan: -All treatment options discussed with the patient including all alternatives, risks, complications.  -She declined second steroid injection today. -Continue stretching, icing, plantar fascial brace on a continual basis. Also prescribed compound cream. Discussed shoe gear modifications and  orthotics. -Follow-up in 4 weeks if symptoms continue. -Patient encouraged to call the office with any questions, concerns, change in symptoms.   Celesta Gentile, DPM

## 2015-05-28 NOTE — Patient Instructions (Signed)

## 2015-06-25 ENCOUNTER — Other Ambulatory Visit: Payer: Self-pay | Admitting: Student

## 2015-06-25 ENCOUNTER — Ambulatory Visit: Payer: Medicare Other | Admitting: Podiatry

## 2015-06-25 DIAGNOSIS — R748 Abnormal levels of other serum enzymes: Secondary | ICD-10-CM

## 2015-06-28 ENCOUNTER — Ambulatory Visit
Admission: RE | Admit: 2015-06-28 | Discharge: 2015-06-28 | Disposition: A | Discharge status: Home / Self Care | Payer: Medicare Other | Source: Ambulatory Visit | Attending: Student | Admitting: Student

## 2015-06-28 DIAGNOSIS — R748 Abnormal levels of other serum enzymes: Secondary | ICD-10-CM | POA: Diagnosis not present

## 2015-07-11 ENCOUNTER — Ambulatory Visit (INDEPENDENT_AMBULATORY_CARE_PROVIDER_SITE_OTHER): Payer: Medicare Other | Admitting: Podiatry

## 2015-07-11 ENCOUNTER — Encounter: Payer: Self-pay | Admitting: Podiatry

## 2015-07-11 DIAGNOSIS — D3610 Benign neoplasm of peripheral nerves and autonomic nervous system, unspecified: Secondary | ICD-10-CM

## 2015-07-11 DIAGNOSIS — M722 Plantar fascial fibromatosis: Secondary | ICD-10-CM

## 2015-07-11 NOTE — Progress Notes (Signed)
Patient ID: Jennifer Mosley, female   DOB: [birth year], 60 y.o.   MRN: [id number]  Subjective: 60 year old female presents the office today for concerns of both of her heels beginning to hurt again. She is a question steroid injections to her heels. She states as a throbbing sensation. No recent injury or trauma. States it feels a same as it did previously. She is also getting some pain between the third and fourth toes and occasional burning pain. This is worse with walking. No recent injury. No swelling or redness. Denies any systemic complaints such as fevers, chills, nausea, vomiting. No acute changes since last appointment, and no other complaints at this time.   Objective: AAO x3, NAD DP/PT pulses palpable bilaterally, CRT less than 3 seconds Tenderness to palpation along the plantar medial tubercle of the calcaneus at the insertion of plantar fascia on the left and right foot. There is no pain along the course of the plantar fascia within the arch of the foot. Plantar fascia appears to be intact. There is no pain with lateral compression of the calcaneus or pain with vibratory sensation. There is no pain along the course or insertion of the achilles tendon.  There is tenderness to the third interspace on the right foot. No palpable neuromas identified however upon palpation there is numbness and tingling to the third and fourth toes. No other areas of tenderness to bilateral lower extremities. No open lesions or pre-ulcerative lesions.  No pain with calf compression, swelling, warmth, erythema  Assessment: Bilateral heel pain, likely plantar fasciitis; neuroma  Plan: -All treatment options discussed with the patient including all alternatives, risks, complications.  -Patient elects to proceed with steroid injection into the left and right heel. Under sterile skin preparation, a total of 2.5cc of kenalog 10, 0.5% Marcaine plain, and 2% lidocaine plain were infiltrated into the symptomatic area  without complication. A band-aid was applied. Patient tolerated the injection well without complication. Post-injection care with discussed with the patient. Discussed with the patient to ice the area over the next couple of days to help prevent a steroid flare.  -Discussed orthotics in shoe gear changes. -Stretching icing exercises. She states that she has not been doing this. -Discussed steroid injection for the neuroma. I dispensed metatarsal pads. If symptoms persist with shoe changes and offloading will perform steroid injection. -Follow-up in 3 weeks or sooner if needed. -Patient encouraged to call the office with any questions, concerns, change in symptoms.   Celesta Gentile, DPM

## 2015-07-31 ENCOUNTER — Ambulatory Visit
Admission: RE | Admit: 2015-07-31 | Discharge: 2015-07-31 | Disposition: A | Discharge status: Home / Self Care | Payer: Medicare Other | Source: Ambulatory Visit | Attending: Family Medicine | Admitting: Family Medicine

## 2015-07-31 ENCOUNTER — Other Ambulatory Visit: Payer: Self-pay | Admitting: Family Medicine

## 2015-07-31 DIAGNOSIS — Z9889 Other specified postprocedural states: Secondary | ICD-10-CM | POA: Diagnosis not present

## 2015-07-31 DIAGNOSIS — M25461 Effusion, right knee: Secondary | ICD-10-CM | POA: Insufficient documentation

## 2015-07-31 DIAGNOSIS — M25561 Pain in right knee: Secondary | ICD-10-CM | POA: Diagnosis not present

## 2015-07-31 DIAGNOSIS — Z96651 Presence of right artificial knee joint: Secondary | ICD-10-CM | POA: Diagnosis not present

## 2015-08-08 ENCOUNTER — Encounter: Payer: Self-pay | Admitting: Podiatry

## 2015-08-08 ENCOUNTER — Ambulatory Visit (INDEPENDENT_AMBULATORY_CARE_PROVIDER_SITE_OTHER): Payer: Medicare Other | Admitting: Podiatry

## 2015-08-08 VITALS — BP 133/85 | HR 64 | Resp 12

## 2015-08-08 DIAGNOSIS — D3610 Benign neoplasm of peripheral nerves and autonomic nervous system, unspecified: Secondary | ICD-10-CM | POA: Diagnosis not present

## 2015-08-08 DIAGNOSIS — I639 Cerebral infarction, unspecified: Secondary | ICD-10-CM

## 2015-08-08 DIAGNOSIS — M722 Plantar fascial fibromatosis: Secondary | ICD-10-CM

## 2015-08-10 NOTE — Progress Notes (Signed)
Patient ID: Jennifer Mosley, female   DOB: 08-05-1955, 60 y.o.   MRN: [id number]  Subjective: 60 year old female presents the office today  For follow up evaluation of bilateral heel pain. She says that she is doing significantly better compared to last appointment and she is no longer have any pain. She denies any burning pain or any numbness or tingling. The pain does not wake her up at night. She is continuing stretching, icing exercises daily. Denies any systemic complaints such as fevers, chills, nausea, vomiting. No acute changes since last appointment, and no other complaints at this time.   Objective: AAO x3, NAD DP/PT pulses palpable bilaterally, CRT less than 3 seconds There is currently no tenderness to palpation along the plantar medial tubercle of the calcaneus at the insertion of plantar fascia on the left and right foot. There is no pain along the course of the plantar fascia within the arch of the foot. Plantar fascia appears to be intact. There is no pain with lateral compression of the calcaneus or pain with vibratory sensation. There is no pain along the course or insertion of the achilles tendon.  There is no  tenderness to the third interspace on the right foot. No palpable neuroma is identified however upon palpation and there is no tenderness to the area.  No other areas of tenderness to bilateral lower extremities. No open lesions or pre-ulcerative lesions.  No pain with calf compression, swelling, warmth, erythema  Assessment: Bilateral heel pain, likely plantar fasciitis; neuroma  Plan: -All treatment options discussed with the patient including all alternatives, risks, complications.  -At this time she is having no symptoms. Continue stretching, icing, plantar fascial brace, supportive shoe gear all times. Offloading pad for neuroma. Follow-up with symptoms recur any changes. Meantime call any questions or concerns or any change in symptoms.  Celesta Gentile, DPM

## 2015-11-18 ENCOUNTER — Other Ambulatory Visit: Payer: Self-pay | Admitting: Family Medicine

## 2015-11-18 DIAGNOSIS — Z1231 Encounter for screening mammogram for malignant neoplasm of breast: Secondary | ICD-10-CM

## 2015-12-24 ENCOUNTER — Other Ambulatory Visit: Payer: Self-pay | Admitting: Student

## 2015-12-24 DIAGNOSIS — Z8379 Family history of other diseases of the digestive system: Secondary | ICD-10-CM

## 2015-12-24 DIAGNOSIS — K7400 Hepatic fibrosis, unspecified: Secondary | ICD-10-CM

## 2015-12-24 DIAGNOSIS — R945 Abnormal results of liver function studies: Principal | ICD-10-CM

## 2015-12-24 DIAGNOSIS — R7989 Other specified abnormal findings of blood chemistry: Secondary | ICD-10-CM

## 2015-12-30 ENCOUNTER — Ambulatory Visit
Admission: RE | Admit: 2015-12-30 | Discharge: 2015-12-30 | Disposition: A | Discharge status: Home / Self Care | Payer: Medicare Other | Source: Ambulatory Visit | Attending: Student | Admitting: Student

## 2015-12-30 DIAGNOSIS — Z8379 Family history of other diseases of the digestive system: Secondary | ICD-10-CM | POA: Diagnosis present

## 2015-12-30 DIAGNOSIS — R945 Abnormal results of liver function studies: Secondary | ICD-10-CM

## 2015-12-30 DIAGNOSIS — R7989 Other specified abnormal findings of blood chemistry: Secondary | ICD-10-CM | POA: Diagnosis present

## 2015-12-30 DIAGNOSIS — K7400 Hepatic fibrosis, unspecified: Secondary | ICD-10-CM

## 2016-06-30 ENCOUNTER — Other Ambulatory Visit: Payer: Self-pay | Admitting: Family Medicine

## 2016-06-30 ENCOUNTER — Ambulatory Visit
Admission: RE | Admit: 2016-06-30 | Discharge: 2016-06-30 | Disposition: A | Discharge status: Home / Self Care | Payer: Medicare Other | Source: Ambulatory Visit | Attending: Family Medicine | Admitting: Family Medicine

## 2016-06-30 DIAGNOSIS — M25571 Pain in right ankle and joints of right foot: Secondary | ICD-10-CM | POA: Insufficient documentation

## 2016-06-30 DIAGNOSIS — M7989 Other specified soft tissue disorders: Secondary | ICD-10-CM | POA: Insufficient documentation

## 2016-08-26 ENCOUNTER — Emergency Department
Admission: EM | Admit: 2016-08-26 | Discharge: 2016-08-26 | Disposition: A | Discharge status: Home / Self Care | Payer: Medicare Other | Attending: Emergency Medicine | Admitting: Emergency Medicine

## 2016-08-26 ENCOUNTER — Encounter: Payer: Self-pay | Admitting: *Deleted

## 2016-08-26 DIAGNOSIS — Y939 Activity, unspecified: Secondary | ICD-10-CM | POA: Insufficient documentation

## 2016-08-26 DIAGNOSIS — Z7982 Long term (current) use of aspirin: Secondary | ICD-10-CM | POA: Diagnosis not present

## 2016-08-26 DIAGNOSIS — E119 Type 2 diabetes mellitus without complications: Secondary | ICD-10-CM | POA: Insufficient documentation

## 2016-08-26 DIAGNOSIS — I10 Essential (primary) hypertension: Secondary | ICD-10-CM | POA: Diagnosis not present

## 2016-08-26 DIAGNOSIS — Y999 Unspecified external cause status: Secondary | ICD-10-CM | POA: Diagnosis not present

## 2016-08-26 DIAGNOSIS — F17210 Nicotine dependence, cigarettes, uncomplicated: Secondary | ICD-10-CM | POA: Diagnosis not present

## 2016-08-26 DIAGNOSIS — J449 Chronic obstructive pulmonary disease, unspecified: Secondary | ICD-10-CM | POA: Diagnosis not present

## 2016-08-26 DIAGNOSIS — Z79899 Other long term (current) drug therapy: Secondary | ICD-10-CM | POA: Diagnosis not present

## 2016-08-26 DIAGNOSIS — S39012A Strain of muscle, fascia and tendon of lower back, initial encounter: Secondary | ICD-10-CM | POA: Diagnosis not present

## 2016-08-26 DIAGNOSIS — Z7901 Long term (current) use of anticoagulants: Secondary | ICD-10-CM | POA: Insufficient documentation

## 2016-08-26 DIAGNOSIS — Y929 Unspecified place or not applicable: Secondary | ICD-10-CM | POA: Insufficient documentation

## 2016-08-26 DIAGNOSIS — X58XXXA Exposure to other specified factors, initial encounter: Secondary | ICD-10-CM | POA: Insufficient documentation

## 2016-08-26 DIAGNOSIS — S3992XA Unspecified injury of lower back, initial encounter: Secondary | ICD-10-CM | POA: Diagnosis present

## 2016-08-26 MED ORDER — KETOROLAC TROMETHAMINE 30 MG/ML IJ SOLN: 30.0000 mg | Freq: Once | INTRAMUSCULAR | Status: DC

## 2016-08-26 MED ORDER — CYCLOBENZAPRINE HCL 10 MG PO TABS: 10.0000 mg | ORAL_TABLET | Freq: Three times a day (TID) | ORAL | 0 refills | Status: DC | PRN

## 2016-08-26 MED ORDER — MELOXICAM 15 MG PO TABS: 15.0000 mg | ORAL_TABLET | Freq: Every day | ORAL | 0 refills | Status: DC

## 2016-08-26 MED ORDER — ORPHENADRINE CITRATE 30 MG/ML IJ SOLN: 60.0000 mg | Freq: Once | INTRAMUSCULAR | Status: DC

## 2016-08-26 NOTE — ED Triage Notes (Signed)
Pt reports having right mid and lower back pain since Saturday. No injury reported. No urinary symptoms. No numbness or decreased sensation. Pt reports pain increases when moving and twisting.

## 2016-08-26 NOTE — ED Provider Notes (Signed)
[facility] Emergency Department Provider Note  ____________________________________________  Time seen: Approximately 5:59 PM  I have reviewed the triage vital signs and the nursing notes.   HISTORY  Chief Complaint Back Pain    HPI Jennifer Mosley is a 61 y.o. female Who presents to emergency department complaining of right lower back pain. Patient reports that she has had a cramp/tightness to her lower back for the past 4 days. She denies any injuries. No urinary symptoms. Patient has not tried any medication other than ibuprofen. No radicular symptoms. No bowel or bladder dysfunction, saddle paresthesias. No other complaints at this time.  Patient has a HX of COPD, HTN, DM II, CVA. No complaints regarding these chronic conditions at this time   Past Medical History:  Diagnosis Date  . Anxiety   . COPD (chronic obstructive pulmonary disease) ([place])   . Depression   . Essential hypertension   . Hyperlipidemia   . Hypertension   . Stroke ([place])   . 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 Neuroma 07/11/2015  . Plantar fasciitis 05/28/2015  . 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 Hyperlipidemia   . Essential hypertension     Past Surgical History:  Procedure Laterality Date  . BILATERAL CARPAL TUNNEL RELEASE    . ECTOPIC PREGNANCY SURGERY      Prior to Admission medications   Medication Sig Start Date End Date Taking? Authorizing Provider  aspirin 81 MG tablet Take 81 mg by mouth daily.    [provider]  clopidogrel (PLAVIX) 75 MG tablet Take 1 tablet (75 mg total) by mouth daily. 05/17/15   Epifanio Lesches, MD  cyclobenzaprine (FLEXERIL) 10 MG tablet Take 1 tablet (10 mg total) by mouth 3 (three) times daily as needed for muscle spasms. 08/26/16   Cuthriell, Charline Bills, PA-C  enalapril (VASOTEC) 10 MG  tablet Take 1 tablet (10 mg total) by mouth daily. 10/16/14   Wellington Hampshire, MD  esomeprazole (NEXIUM) 40 MG capsule Take 40 mg by mouth daily at 12 noon.    [provider]  fenoprofen (NALFON) 600 MG TABS tablet Take 600 mg by mouth daily.    [provider]  meloxicam (MOBIC) 15 MG tablet Take 1 tablet (15 mg total) by mouth daily. 08/26/16   Cuthriell, Charline Bills, PA-C    Allergies Patient has no known allergies.  Family History  Problem Relation Age of Onset  . Hypertension Mother   . Hyperlipidemia Mother   . Breast cancer Cousin     Social History Social History  Substance Use Topics  . Smoking status: Current Every Day Smoker    Packs/day: 0.50    Years: 37.00    Types: Cigarettes  . Smokeless tobacco: Never Used  . Alcohol use Yes     Comment: Rare.      Review of Systems  Constitutional: No fever/chills Eyes: No visual changes.  Cardiovascular: no chest pain. Respiratory: no cough. No SOB. Gastrointestinal: No abdominal pain.  No nausea, no vomiting.  No diarrhea.  No constipation. Genitourinary: Negative for dysuria. No hematuria Musculoskeletal: Negative for musculoskeletal pain. Skin: Negative for rash, abrasions, lacerations, ecchymosis. Neurological: Negative for headaches, focal weakness or numbness. 10-point ROS otherwise negative.  ____________________________________________   PHYSICAL EXAM:  VITAL SIGNS: ED Triage Vitals  Enc Vitals Group     BP 08/26/16 1736 Marland Kitchen)  150/92     Pulse Rate 08/26/16 1736 67     Resp 08/26/16 1736 16     Temp 08/26/16 1736 98.7 F (37.1 C)     Temp Source 08/26/16 1736 Oral     SpO2 08/26/16 1736 96 %     Weight 08/26/16 1736 210 lb (95.3 kg)     Height 08/26/16 1736 5' 4.5" (1.638 m)     Head Circumference --      Peak Flow --      Pain Score 08/26/16 1735 8     Pain Loc --      Pain Edu? --      Excl. in Hurt? --      Constitutional: Alert and oriented. Well appearing and in no acute  distress. Eyes: Conjunctivae are normal. PERRL. EOMI. Head: Atraumatic. ENT:      Ears:       Nose: No congestion/rhinnorhea.      Mouth/Throat: Mucous membranes are moist.  Neck: No stridor.    Cardiovascular: Normal rate, regular rhythm. Normal S1 and S2.  Good peripheral circulation. Respiratory: Normal respiratory effort without tachypnea or retractions. Lungs CTAB. Good air entry to the bases with no decreased or absent breath sounds. Gastrointestinal: Bowel sounds 4 quadrants. Soft and nontender to palpation. No guarding or rigidity. No palpable masses. No distention. No CVA tenderness. Musculoskeletal: Full range of motion to all extremities. No gross deformities appreciated.no visible deformity to spine upon inspection. Full range of motion to the lumbar spine. Patient is diffusely tender to palpation and right-sided paraspinal muscle groups. No tenderness to palpation over midline spinal processes. No tenderness to palpation over bilateral sciatic notches, negative straight leg raise, dorsalis pedis pulse intact bilaterally, sensation intact bilaterally. Neurologic:  Normal speech and language. No gross focal neurologic deficits are appreciated.  Skin:  Skin is warm, dry and intact. No rash noted. Psychiatric: Mood and affect are normal. Speech and behavior are normal. Patient exhibits appropriate insight and judgement.   ____________________________________________   LABS (all labs ordered are listed, but only abnormal results are displayed)  Labs Reviewed - No data to display ____________________________________________  EKG   ____________________________________________  RADIOLOGY   No results found.  ____________________________________________    PROCEDURES  Procedure(s) performed:    Procedures    Medications  orphenadrine (NORFLEX) injection 60 mg (not administered)  ketorolac (TORADOL) 30 MG/ML injection 30 mg (not administered)      ____________________________________________   INITIAL IMPRESSION / ASSESSMENT AND PLAN / ED COURSE  Pertinent labs & imaging results that were available during my care of the patient were reviewed by me and considered in my medical decision making (see chart for details).  Review of the [place] CSRS was performed in accordance of the [place] prior to dispensing any controlled drugs.     Patient's diagnosis is consistent with lumbar strain with paraspinal muscle spasms.exam is reassuring with no indication for labs or imaging. Patient declined Toradol and muscle relaxer injection in the emergency department. Patient will be discharged home with prescriptions for anti-inflammatory and muscle relaxer for symptom control. Patient is to follow up with primary care as needed or otherwise directed. Patient is given ED precautions to return to the ED for any worsening or new symptoms.     ____________________________________________  FINAL CLINICAL IMPRESSION(S) / ED DIAGNOSES  Final diagnoses:  Strain of lumbar region, initial encounter      NEW MEDICATIONS STARTED DURING THIS VISIT:  New Prescriptions   CYCLOBENZAPRINE (FLEXERIL) 10  MG TABLET    Take 1 tablet (10 mg total) by mouth 3 (three) times daily as needed for muscle spasms.   MELOXICAM (MOBIC) 15 MG TABLET    Take 1 tablet (15 mg total) by mouth daily.        This chart was dictated using voice recognition software/Dragon. Despite best efforts to proofread, errors can occur which can change the meaning. Any change was purely unintentional.    Darletta Moll, PA-C 08/26/16 1818    Cuthriell, Charline Bills, PA-C 08/26/16 1820    Orbie Pyo, MD 08/26/16 [phone]

## 2017-02-03 ENCOUNTER — Other Ambulatory Visit: Payer: Self-pay | Admitting: Family Medicine

## 2017-02-03 DIAGNOSIS — Z1231 Encounter for screening mammogram for malignant neoplasm of breast: Secondary | ICD-10-CM

## 2017-02-16 ENCOUNTER — Other Ambulatory Visit: Payer: Self-pay

## 2017-02-16 ENCOUNTER — Telehealth: Payer: Self-pay

## 2017-02-16 DIAGNOSIS — Z1211 Encounter for screening for malignant neoplasm of colon: Secondary | ICD-10-CM

## 2017-02-16 NOTE — Telephone Encounter (Signed)
Gastroenterology Pre-Procedure Review  Request Date: 03/15/17 Requesting Physician: Dr. Vicente Males  PATIENT REVIEW QUESTIONS: The patient responded to the following health history questions as indicated:    1. Are you having any GI issues? no 2. Do you have a personal history of Polyps? yes (unsure of year) 3. Do you have a family history of Colon Cancer or Polyps? yes (father and brother colon cancer) 4. Diabetes Mellitus? yes (oral meds) 5. Joint replacements in the past 12 months?no 6. Major health problems in the past 3 months?no 7. Any artificial heart valves, MVP, or defibrillator?no    MEDICATIONS & ALLERGIES:    Patient reports the following regarding taking any anticoagulation/antiplatelet therapy:   Plavix, Coumadin, Eliquis, Xarelto, Lovenox, Pradaxa, Brilinta, or Effient? no Aspirin? no  Patient confirms/reports the following medications:  Current Outpatient Medications  Medication Sig Dispense Refill  . aspirin 81 MG tablet Take 81 mg by mouth daily.    . clopidogrel (PLAVIX) 75 MG tablet Take 1 tablet (75 mg total) by mouth daily. 30 tablet 0  . cyclobenzaprine (FLEXERIL) 10 MG tablet Take 1 tablet (10 mg total) by mouth 3 (three) times daily as needed for muscle spasms. 15 tablet 0  . enalapril (VASOTEC) 10 MG tablet Take 1 tablet (10 mg total) by mouth daily. 90 tablet 3  . esomeprazole (NEXIUM) 40 MG capsule Take 40 mg by mouth daily at 12 noon.    . fenoprofen (NALFON) 600 MG TABS tablet Take 600 mg by mouth daily.    . meloxicam (MOBIC) 15 MG tablet Take 1 tablet (15 mg total) by mouth daily. 30 tablet 0   No current facility-administered medications for this visit.     Patient confirms/reports the following allergies:  No Known Allergies  No orders of the defined types were placed in this encounter.   AUTHORIZATION INFORMATION Primary Insurance: 1D#: Group #:  Secondary Insurance: 1D#: Group #:  SCHEDULE INFORMATION: Date:  03/15/17 Time: Location:[facility]

## 2017-03-12 ENCOUNTER — Ambulatory Visit
Admission: RE | Admit: 2017-03-12 | Discharge: 2017-03-12 | Disposition: A | Discharge status: Home / Self Care | Payer: Medicare Other | Source: Ambulatory Visit | Attending: Family Medicine | Admitting: Family Medicine

## 2017-03-12 DIAGNOSIS — Z1231 Encounter for screening mammogram for malignant neoplasm of breast: Secondary | ICD-10-CM

## 2017-03-15 ENCOUNTER — Ambulatory Visit: Payer: Medicare Other | Admitting: Certified Registered Nurse Anesthetist

## 2017-03-15 ENCOUNTER — Encounter
Admission: RE | Disposition: A | Discharge status: Home / Self Care | Payer: Self-pay | Source: Ambulatory Visit | Attending: Gastroenterology

## 2017-03-15 ENCOUNTER — Ambulatory Visit
Admission: RE | Admit: 2017-03-15 | Discharge: 2017-03-15 | Disposition: A | Discharge status: Home / Self Care | Payer: Medicare Other | Source: Ambulatory Visit | Attending: Gastroenterology | Admitting: Gastroenterology

## 2017-03-15 ENCOUNTER — Other Ambulatory Visit: Payer: Self-pay

## 2017-03-15 ENCOUNTER — Encounter: Payer: Self-pay | Admitting: *Deleted

## 2017-03-15 DIAGNOSIS — I10 Essential (primary) hypertension: Secondary | ICD-10-CM | POA: Diagnosis not present

## 2017-03-15 DIAGNOSIS — Z86010 Personal history of colonic polyps: Secondary | ICD-10-CM | POA: Diagnosis not present

## 2017-03-15 DIAGNOSIS — E785 Hyperlipidemia, unspecified: Secondary | ICD-10-CM | POA: Insufficient documentation

## 2017-03-15 DIAGNOSIS — Z87891 Personal history of nicotine dependence: Secondary | ICD-10-CM | POA: Diagnosis not present

## 2017-03-15 DIAGNOSIS — K635 Polyp of colon: Secondary | ICD-10-CM | POA: Diagnosis not present

## 2017-03-15 DIAGNOSIS — Z79899 Other long term (current) drug therapy: Secondary | ICD-10-CM | POA: Insufficient documentation

## 2017-03-15 DIAGNOSIS — D128 Benign neoplasm of rectum: Secondary | ICD-10-CM | POA: Diagnosis not present

## 2017-03-15 DIAGNOSIS — D122 Benign neoplasm of ascending colon: Secondary | ICD-10-CM | POA: Diagnosis not present

## 2017-03-15 DIAGNOSIS — Z8673 Personal history of transient ischemic attack (TIA), and cerebral infarction without residual deficits: Secondary | ICD-10-CM | POA: Insufficient documentation

## 2017-03-15 DIAGNOSIS — E11610 Type 2 diabetes mellitus with diabetic neuropathic arthropathy: Secondary | ICD-10-CM | POA: Insufficient documentation

## 2017-03-15 DIAGNOSIS — J449 Chronic obstructive pulmonary disease, unspecified: Secondary | ICD-10-CM | POA: Diagnosis not present

## 2017-03-15 DIAGNOSIS — D125 Benign neoplasm of sigmoid colon: Secondary | ICD-10-CM | POA: Diagnosis not present

## 2017-03-15 DIAGNOSIS — Z7984 Long term (current) use of oral hypoglycemic drugs: Secondary | ICD-10-CM | POA: Insufficient documentation

## 2017-03-15 DIAGNOSIS — K5730 Diverticulosis of large intestine without perforation or abscess without bleeding: Secondary | ICD-10-CM | POA: Diagnosis not present

## 2017-03-15 DIAGNOSIS — Z7982 Long term (current) use of aspirin: Secondary | ICD-10-CM | POA: Insufficient documentation

## 2017-03-15 DIAGNOSIS — D120 Benign neoplasm of cecum: Secondary | ICD-10-CM | POA: Insufficient documentation

## 2017-03-15 DIAGNOSIS — D123 Benign neoplasm of transverse colon: Secondary | ICD-10-CM

## 2017-03-15 DIAGNOSIS — Z1211 Encounter for screening for malignant neoplasm of colon: Secondary | ICD-10-CM | POA: Diagnosis present

## 2017-03-15 DIAGNOSIS — F419 Anxiety disorder, unspecified: Secondary | ICD-10-CM | POA: Insufficient documentation

## 2017-03-15 DIAGNOSIS — F329 Major depressive disorder, single episode, unspecified: Secondary | ICD-10-CM | POA: Insufficient documentation

## 2017-03-15 DIAGNOSIS — K621 Rectal polyp: Secondary | ICD-10-CM

## 2017-03-15 HISTORY — PX: COLONOSCOPY WITH PROPOFOL: SHX5780

## 2017-03-15 SURGERY — COLONOSCOPY WITH PROPOFOL
Anesthesia: General

## 2017-03-15 MED ORDER — PROPOFOL 500 MG/50ML IV EMUL
INTRAVENOUS | Status: AC
  Filled 2017-03-15: qty 50

## 2017-03-15 MED ORDER — LIDOCAINE HCL (CARDIAC) 20 MG/ML IV SOLN
INTRAVENOUS | Status: DC | PRN
  Administered 2017-03-15: 50 mg via INTRAVENOUS

## 2017-03-15 MED ORDER — PROPOFOL 10 MG/ML IV BOLUS
INTRAVENOUS | Status: AC
  Filled 2017-03-15: qty 20

## 2017-03-15 MED ORDER — PROPOFOL 10 MG/ML IV BOLUS
INTRAVENOUS | Status: DC | PRN
  Administered 2017-03-15: 20 mg via INTRAVENOUS
  Administered 2017-03-15: 50 mg via INTRAVENOUS
  Administered 2017-03-15: 20 mg via INTRAVENOUS

## 2017-03-15 MED ORDER — SODIUM CHLORIDE 0.9 % IV SOLN
INTRAVENOUS | Status: DC
  Administered 2017-03-15: 10:00:00 via INTRAVENOUS

## 2017-03-15 MED ORDER — LIDOCAINE HCL (PF) 2 % IJ SOLN
INTRAMUSCULAR | Status: AC
  Filled 2017-03-15: qty 10

## 2017-03-15 MED ORDER — PROPOFOL 500 MG/50ML IV EMUL
INTRAVENOUS | Status: DC | PRN
  Administered 2017-03-15: 175 ug/kg/min via INTRAVENOUS

## 2017-03-15 NOTE — Anesthesia Post-op Follow-up Note (Signed)
Anesthesia QCDR form completed.        

## 2017-03-15 NOTE — Transfer of Care (Signed)
Immediate Anesthesia Transfer of Care Note  Patient: Jennifer Mosley  Procedure(s) Performed: COLONOSCOPY WITH PROPOFOL (N/A )  Patient Location: PACU  Anesthesia Type:General  Level of Consciousness: sedated  Airway & Oxygen Therapy: Patient Spontanous Breathing and Patient connected to nasal cannula oxygen  Post-op Assessment: Report given to RN and Post -op Vital signs reviewed and stable  Post vital signs: Reviewed and stable  Last Vitals:  Vitals:   03/15/17 1050 03/15/17 1057  BP: (!) 142/94   Pulse: 75   Resp: 12 (!) 24  Temp: 36.8 C 36.8 C  SpO2: 98%     Last Pain:  Vitals:   03/15/17 1057  TempSrc: Temporal      Patients Stated Pain Goal: 6 (38/17/71 1657)  Complications: No apparent anesthesia complications

## 2017-03-15 NOTE — Anesthesia Postprocedure Evaluation (Signed)
Anesthesia Post Note  Patient: NATE PERRI  Procedure(s) Performed: COLONOSCOPY WITH PROPOFOL (N/A )  Patient location during evaluation: Endoscopy Anesthesia Type: General Level of consciousness: awake and alert Pain management: pain level controlled Vital Signs Assessment: post-procedure vital signs reviewed and stable Respiratory status: spontaneous breathing, nonlabored ventilation, respiratory function stable and patient connected to nasal cannula oxygen Cardiovascular status: blood pressure returned to baseline and stable Postop Assessment: no apparent nausea or vomiting Anesthetic complications: no     Last Vitals:  Vitals:   03/15/17 1133 03/15/17 1144  BP: (!) 138/100 (!) 151/85  Pulse:    Resp:    Temp:    SpO2:      Last Pain:  Vitals:   03/15/17 1057  TempSrc: Temporal                 Martha Clan

## 2017-03-15 NOTE — Anesthesia Preprocedure Evaluation (Signed)
Anesthesia Evaluation  Patient identified by MRN, date of birth, ID band Patient awake    Reviewed: Allergy & Precautions, H&P , NPO status , Patient's Chart, lab work & pertinent test results, reviewed documented beta blocker date and time   History of Anesthesia Complications Negative for: history of anesthetic complications  Airway Mallampati: III  TM Distance: >3 FB Neck ROM: full  Mouth opening: Limited Mouth Opening  Dental  (+) Upper Dentures, Lower Dentures   Pulmonary shortness of breath and with exertion, COPD,  COPD inhaler, Recent URI , Resolved, former smoker,           Cardiovascular Exercise Tolerance: Good hypertension, (-) angina(-) CAD, (-) Past MI, (-) Cardiac Stents and (-) CABG (-) dysrhythmias (-) Valvular Problems/Murmurs     Neuro/Psych neg Seizures PSYCHIATRIC DISORDERS Anxiety Depression CVA    GI/Hepatic GERD  ,NAFLD   Endo/Other  diabetes, Oral Hypoglycemic Agents  Renal/GU negative Renal ROS  negative genitourinary   Musculoskeletal   Abdominal   Peds  Hematology negative hematology ROS (+)   Anesthesia Other Findings Past Medical History: No date: Anxiety No date: COPD (chronic obstructive pulmonary disease) (HCC) No date: Depression No date: Essential hypertension No date: Hyperlipidemia No date: Hypertension No date: Stroke [facility]) No date: 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)   Reproductive/Obstetrics negative OB ROS                             Anesthesia Physical Anesthesia Plan  ASA: III  Anesthesia Plan: General   Post-op Pain Management:    Induction: Intravenous  PONV Risk Score and Plan: 3 and Propofol infusion  Airway Management Planned: Nasal Cannula  Additional Equipment:   Intra-op Plan:   Post-operative Plan:   Informed Consent: I have reviewed the patients History and Physical, chart, labs and discussed  the procedure including the risks, benefits and alternatives for the proposed anesthesia with the patient or authorized representative who has indicated his/her understanding and acceptance.   Dental Advisory Given  Plan Discussed with: Anesthesiologist, CRNA and Surgeon  Anesthesia Plan Comments:         Anesthesia Quick Evaluation

## 2017-03-15 NOTE — Op Note (Signed)
[facility] Gastroenterology Patient Name: Jennifer Mosley Procedure Date: 03/15/2017 9:59 AM MRN: [id number] Account #: [id number] Date of Birth: 11-Mar-1955 Admit Type: Outpatient Age: 62 Room: [facility] ENDO ROOM 4 Gender: Female Note Status: Finalized Procedure:            Colonoscopy Indications:          High risk colon cancer surveillance: Personal history                        of colonic polyps Providers:            [name] MD, MD Referring MD:         [name], MD (Referring MD) Medicines:            Monitored Anesthesia Care Complications:        No immediate complications. Procedure:            Pre-Anesthesia Assessment:                       - Prior to the procedure, a History and Physical was                        performed, and patient medications, allergies and                        sensitivities were reviewed. The patient's tolerance of                        previous anesthesia was reviewed.                       - The risks and benefits of the procedure and the                        sedation options and risks were discussed with the                        patient. All questions were answered and informed                        consent was obtained.                       - ASA Grade Assessment: III - A patient with severe                        systemic disease.                       After obtaining informed consent, the colonoscope was                        passed under direct vision. Throughout the procedure,                        the patient's blood pressure, pulse, and oxygen                        saturations were monitored continuously. The  Colonoscope was introduced through the anus and                        advanced to the the cecum, identified by the                        appendiceal orifice, IC valve and transillumination.                        The colonoscopy was performed with ease. The patient                    tolerated the procedure well. The quality of the bowel                        preparation was good. Findings:      The perianal and digital rectal examinations were normal.      Multiple sessile polyps were found in the sigmoid colon. The polyps were       4 to 6 mm in size. These polyps were removed with a cold snare.       Resection and retrieval were complete.      Two sessile polyps were found in the transverse colon and cecum. The       polyps were 4 to 6 mm in size. These polyps were removed with a cold       snare. Resection and retrieval were complete.      Multiple sessile polyps were found in the rectum. The polyps were small       in size. These polyps were removed with a cold snare. Resection and       retrieval were complete.      A 3 mm polyp was found in the ascending colon. The polyp was sessile.       The polyp was removed with a cold biopsy forceps. Resection and       retrieval were complete.      Six sessile polyps were found in the ascending colon. The polyps were 4       to 6 mm in size. These polyps were removed with a cold snare. Resection       and retrieval were complete.      Multiple small-mouthed diverticula were found in the sigmoid colon.      The exam was otherwise without abnormality on direct and retroflexion       views.      Multiple medium-sized localized angioectasias without bleeding were       found in the ascending colon.      There was a medium-sized lipoma, 15 mm in diameter, in the proximal       ascending colon. positive pillow sign Impression:           - Multiple 4 to 6 mm polyps in the sigmoid colon,                        removed with a cold snare. Resected and retrieved.                       - Two 4 to 6 mm polyps in the transverse colon and in                        the  cecum, removed with a cold snare. Resected and                        retrieved.                       - Multiple small polyps in the rectum, removed  with a                        cold snare. Resected and retrieved.                       - One 3 mm polyp in the ascending colon, removed with a                        cold biopsy forceps. Resected and retrieved.                       - Six 4 to 6 mm polyps in the ascending colon, removed                        with a cold snare. Resected and retrieved.                       - Diverticulosis in the sigmoid colon.                       - The examination was otherwise normal on direct and                        retroflexion views. Recommendation:       - Discharge patient to home (with escort).                       - Resume previous diet.                       - Resume aspirin today and Plavix (clopidogrel)                        tomorrow at prior doses.                       - Await pathology results.                       - Repeat colonoscopy in 1 year for surveillance. Procedure Code(s):    --- Professional ---                       [phone], Colonoscopy, flexible; with removal of tumor(s),                        polyp(s), or other lesion(s) by snare technique                       12751, 50, Colonoscopy, flexible; with biopsy, single                        or multiple Diagnosis Code(s):    --- Professional ---  Z86.010, Personal history of colonic polyps                       D12.3, Benign neoplasm of transverse colon (hepatic                        flexure or splenic flexure)                       D12.5, Benign neoplasm of sigmoid colon                       D12.0, Benign neoplasm of cecum                       K62.1, Rectal polyp                       D12.2, Benign neoplasm of ascending colon                       K57.30, Diverticulosis of large intestine without                        perforation or abscess without bleeding CPT copyright 2016 American Medical Association. All rights reserved. The codes documented in this report are preliminary and upon coder  review may  be revised to meet current compliance requirements. [name], MD [name] MD, MD 03/15/2017 10:56:51 AM This report has been signed electronically. Number of Addenda: 0 Note Initiated On: 03/15/2017 9:59 AM Scope Withdrawal Time: 0 hours 28 minutes 38 seconds  Total Procedure Duration: 0 hours 37 minutes 35 seconds       [employer]

## 2017-03-15 NOTE — Anesthesia Procedure Notes (Signed)
Date/Time: 03/15/2017 10:09 AM Performed by: Johnna Acosta, CRNA Pre-anesthesia Checklist: Patient identified, Emergency Drugs available, Suction available, Patient being monitored and Timeout performed Patient Re-evaluated:Patient Re-evaluated prior to induction Oxygen Delivery Method: Nasal cannula Preoxygenation: Pre-oxygenation with 100% oxygen

## 2017-03-15 NOTE — H&P (Signed)
[name], MD [address] [place], [place], [place], [address] [address], [place], [place], [place], [address] Phone: [phone]  Fax: [phone]  Primary Care Physician:  [name], MD   Pre-Procedure History & Physical: HPI:  Jennifer Mosley is a 62 y.o. female is here for an colonoscopy.   Past Medical History:  Diagnosis Date  . Anxiety   . COPD (chronic obstructive pulmonary disease) ([place])   . Depression   . Essential hypertension   . Hyperlipidemia   . Hypertension   . Stroke ([place])   . 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 BILATERAL CARPAL TUNNEL RELEASE    . COLONOSCOPY    . ECTOPIC PREGNANCY SURGERY      Prior to Admission medications   Medication Sig Start Date End Date Taking? Authorizing Provider  aspirin 81 MG tablet Take 81 mg by mouth daily.   Yes [provider]  clopidogrel (PLAVIX) 75 MG tablet Take 1 tablet (75 mg total) by mouth daily. 05/17/15  Yes Epifanio Lesches, MD  cyclobenzaprine (FLEXERIL) 10 MG tablet Take 1 tablet (10 mg total) by mouth 3 (three) times daily as needed for muscle spasms. 08/26/16  Yes Cuthriell, Charline Bills, PA-C  enalapril (VASOTEC) 10 MG tablet Take 1 tablet (10 mg total) by mouth daily. 10/16/14  Yes Wellington Hampshire, MD  esomeprazole (NEXIUM) 40 MG capsule Take 40 mg by mouth daily at 12 noon.   Yes [provider]  fenoprofen (NALFON) 600 MG TABS tablet Take 600 mg by mouth daily.   Yes [provider]  meloxicam (MOBIC) 15 MG tablet Take 1 tablet (15 mg total) by mouth daily. 08/26/16  Yes Cuthriell, Charline Bills, PA-C    Allergies as of 02/16/2017  . (No Known Allergies)    Family History  Problem Relation Age of Onset  . Hypertension Mother   . Hyperlipidemia Mother   . Breast cancer Cousin     Social History   Socioeconomic History  . Marital status: Widowed    Spouse name: Not on  file  . Number of children: Not on file  . Years of education: Not on file  . Highest education level: Not on file  Social Needs  . Financial resource strain: Not on file  . Food insecurity - worry: Not on file  . Food insecurity - inability: Not on file  . Transportation needs - medical: Not on file  . Transportation needs - non-medical: Not on file  Occupational History  . Not on file  Tobacco Use  . Smoking status: Former Smoker    Packs/day: 0.50    Years: 37.00    Pack years: 18.50    Types: Cigarettes    Last attempt to quit: 03/16/2015    Years since quitting: 2.0  . Smokeless tobacco: Never Used  Substance and Sexual Activity  . Alcohol use: Yes    Comment: Rare.   . Drug use: No  . Sexual activity: Not on file  Other Topics Concern  . Not on file  Social History Narrative  . Not on file    Review of Systems: See HPI, otherwise negative ROS  Physical Exam: BP (!) 157/90   Pulse 73   Temp 97.8 F (36.6 C) (Tympanic)   Resp 18   Ht 5' 4.5" (1.638 m)   Wt 210 lb (95.3 kg)   SpO2 96%  BMI 35.49 kg/m  General:   Alert,  pleasant and cooperative in NAD Head:  Normocephalic and atraumatic. Neck:  Supple; no masses or thyromegaly. Lungs:  Clear throughout to auscultation, normal respiratory effort.    Heart:  +S1, +S2, Regular rate and rhythm, No edema. Abdomen:  Soft, nontender and nondistended. Normal bowel sounds, without guarding, and without rebound.   Neurologic:  Alert and  oriented x4;  grossly normal neurologically.  Impression/Plan: Jennifer Mosley is here for an colonoscopy to be performed for surveillance due to prior history of colon polyps   Risks, benefits, limitations, and alternatives regarding  colonoscopy have been reviewed with the patient.  Questions have been answered.  All parties agreeable.   [name], MD  03/15/2017, 9:57 AM

## 2017-03-16 ENCOUNTER — Encounter: Payer: Self-pay | Admitting: Gastroenterology

## 2017-03-16 LAB — SURGICAL PATHOLOGY

## 2017-03-21 ENCOUNTER — Emergency Department: Payer: Medicare Other

## 2017-03-21 ENCOUNTER — Encounter: Payer: Self-pay | Admitting: Emergency Medicine

## 2017-03-21 ENCOUNTER — Emergency Department
Admission: EM | Admit: 2017-03-21 | Discharge: 2017-03-21 | Disposition: A | Discharge status: Home / Self Care | Payer: Medicare Other | Attending: Emergency Medicine | Admitting: Emergency Medicine

## 2017-03-21 ENCOUNTER — Encounter: Payer: Self-pay | Admitting: Gastroenterology

## 2017-03-21 ENCOUNTER — Other Ambulatory Visit: Payer: Self-pay

## 2017-03-21 DIAGNOSIS — Z87891 Personal history of nicotine dependence: Secondary | ICD-10-CM | POA: Insufficient documentation

## 2017-03-21 DIAGNOSIS — Z7902 Long term (current) use of antithrombotics/antiplatelets: Secondary | ICD-10-CM | POA: Diagnosis not present

## 2017-03-21 DIAGNOSIS — J449 Chronic obstructive pulmonary disease, unspecified: Secondary | ICD-10-CM | POA: Diagnosis not present

## 2017-03-21 DIAGNOSIS — Z7982 Long term (current) use of aspirin: Secondary | ICD-10-CM | POA: Insufficient documentation

## 2017-03-21 DIAGNOSIS — E11610 Type 2 diabetes mellitus with diabetic neuropathic arthropathy: Secondary | ICD-10-CM | POA: Diagnosis not present

## 2017-03-21 DIAGNOSIS — I10 Essential (primary) hypertension: Secondary | ICD-10-CM | POA: Diagnosis not present

## 2017-03-21 DIAGNOSIS — J101 Influenza due to other identified influenza virus with other respiratory manifestations: Secondary | ICD-10-CM | POA: Diagnosis not present

## 2017-03-21 DIAGNOSIS — R05 Cough: Secondary | ICD-10-CM | POA: Diagnosis present

## 2017-03-21 LAB — COMPREHENSIVE METABOLIC PANEL
ALBUMIN: 2.9 g/dL — AB (ref 3.5–5.0)
ALK PHOS: 181 U/L — AB (ref 38–126)
ALT: 67 U/L — ABNORMAL HIGH (ref 14–54)
ANION GAP: 7 (ref 5–15)
AST: 100 U/L — ABNORMAL HIGH (ref 15–41)
BUN: 7 mg/dL (ref 6–20)
CALCIUM: 8.3 mg/dL — AB (ref 8.9–10.3)
CHLORIDE: 103 mmol/L (ref 101–111)
CO2: 24 mmol/L (ref 22–32)
Creatinine, Ser: 0.78 mg/dL (ref 0.44–1.00)
GFR calc Af Amer: >60 mL/min (ref >60)
GFR calc non Af Amer: >60 mL/min (ref >60)
GLUCOSE: 214 mg/dL — AB (ref 65–99)
Potassium: 3.5 mmol/L (ref 3.5–5.1)
SODIUM: 134 mmol/L — AB (ref 135–145)
Total Bilirubin: 1.1 mg/dL (ref 0.3–1.2)
Total Protein: 6.7 g/dL (ref 6.5–8.1)

## 2017-03-21 LAB — CBC
HEMATOCRIT: 42.6 % (ref 35.0–47.0)
Hemoglobin: 14.3 g/dL (ref 12.0–16.0)
MCH: 30.7 pg (ref 26.0–34.0)
MCHC: 33.6 g/dL (ref 32.0–36.0)
MCV: 91.5 fL (ref 80.0–100.0)
Platelets: 118 10*3/uL — ABNORMAL LOW (ref 150–440)
RBC: 4.66 MIL/uL (ref 3.80–5.20)
RDW: 13.3 % (ref 11.5–14.5)
WBC: 3.3 10*3/uL — ABNORMAL LOW (ref 3.6–11.0)

## 2017-03-21 LAB — INFLUENZA PANEL BY PCR (TYPE A & B)
INFLAPCR: POSITIVE — AB
Influenza B By PCR: NEGATIVE

## 2017-03-21 LAB — LIPASE, BLOOD: LIPASE: 27 U/L (ref 11–51)

## 2017-03-21 MED ORDER — OSELTAMIVIR PHOSPHATE 75 MG PO CAPS
75.0000 mg | ORAL_CAPSULE | Freq: Once | ORAL | Status: AC
  Administered 2017-03-21: 75 mg via ORAL
  Filled 2017-03-21: qty 1

## 2017-03-21 MED ORDER — PREDNISONE 50 MG PO TABS: 50.0000 mg | ORAL_TABLET | Freq: Every day | ORAL | 0 refills | Status: AC

## 2017-03-21 MED ORDER — NAPROXEN 500 MG PO TABS
500.0000 mg | ORAL_TABLET | Freq: Once | ORAL | Status: AC
  Administered 2017-03-21: 500 mg via ORAL
  Filled 2017-03-21: qty 1

## 2017-03-21 MED ORDER — NAPROXEN 500 MG PO TABS: 500.0000 mg | ORAL_TABLET | Freq: Two times a day (BID) | ORAL | 0 refills | Status: AC

## 2017-03-21 MED ORDER — PREDNISONE 20 MG PO TABS
60.0000 mg | ORAL_TABLET | Freq: Once | ORAL | Status: AC
  Administered 2017-03-21: 60 mg via ORAL
  Filled 2017-03-21: qty 3

## 2017-03-21 MED ORDER — BENZONATATE 100 MG PO CAPS: 100.0000 mg | ORAL_CAPSULE | Freq: Four times a day (QID) | ORAL | 0 refills | Status: AC | PRN

## 2017-03-21 MED ORDER — OSELTAMIVIR PHOSPHATE 75 MG PO CAPS: 75.0000 mg | ORAL_CAPSULE | Freq: Two times a day (BID) | ORAL | 0 refills | Status: AC

## 2017-03-21 NOTE — Discharge Instructions (Signed)
Please take all of your steroids and all of your Tamiflu as prescribed.  Naproxen has been shown to be more effective than ibuprofen in treating influenza symptoms.  Follow-up with your primary care physician in 2 days as needed and return to the emergency department sooner for any concerns whatsoever.  It is normal for influenza symptoms to last a full 6-7 days.  It was a pleasure to take care of you today, and thank you for coming to our emergency department.  If you have any questions or concerns before leaving please ask the nurse to grab me and I'm more than happy to go through your aftercare instructions again.  If you were prescribed any opioid pain medication today such as Norco, Vicodin, Percocet, morphine, hydrocodone, or oxycodone please make sure you do not drive when you are taking this medication as it can alter your ability to drive safely.  If you have any concerns once you are home that you are not improving or are in fact getting worse before you can make it to your follow-up appointment, please do not hesitate to call 911 and come back for further evaluation.  Darel Hong, MD  Results for orders placed or performed during the hospital encounter of 03/21/17  Lipase, blood  Result Value Ref Range   Lipase 27 11 - 51 U/L  Comprehensive metabolic panel  Result Value Ref Range   Sodium 134 (L) 135 - 145 mmol/L   Potassium 3.5 3.5 - 5.1 mmol/L   Chloride 103 101 - 111 mmol/L   CO2 24 22 - 32 mmol/L   Glucose, Bld 214 (H) 65 - 99 mg/dL   BUN 7 6 - 20 mg/dL   Creatinine, Ser 0.78 0.44 - 1.00 mg/dL   Calcium 8.3 (L) 8.9 - 10.3 mg/dL   Total Protein 6.7 6.5 - 8.1 g/dL   Albumin 2.9 (L) 3.5 - 5.0 g/dL   AST 100 (H) 15 - 41 U/L   ALT 67 (H) 14 - 54 U/L   Alkaline Phosphatase 181 (H) 38 - 126 U/L   Total Bilirubin 1.1 0.3 - 1.2 mg/dL   GFR calc non Af Amer >60 >60 mL/min   GFR calc Af Amer >60 >60 mL/min   Anion gap 7 5 - 15  CBC  Result Value Ref Range   WBC 3.3 (L) 3.6  - 11.0 K/uL   RBC 4.66 3.80 - 5.20 MIL/uL   Hemoglobin 14.3 12.0 - 16.0 g/dL   HCT 42.6 35.0 - 47.0 %   MCV 91.5 80.0 - 100.0 fL   MCH 30.7 26.0 - 34.0 pg   MCHC 33.6 32.0 - 36.0 g/dL   RDW 13.3 11.5 - 14.5 %   Platelets 118 (L) 150 - 440 K/uL  Influenza panel by PCR (type A & B)  Result Value Ref Range   Influenza A By PCR POSITIVE (A) NEGATIVE   Influenza B By PCR NEGATIVE NEGATIVE   Dg Chest 2 View  Result Date: 03/21/2017 CLINICAL DATA:  Cough and difficulty breathing. EXAM: CHEST - 2 VIEW COMPARISON:  Chest x-ray dated August 14, 2013. FINDINGS: The heart size and mediastinal contours are within normal limits. Normal pulmonary vascularity. Lungs are mildly hyperinflated with coarsened interstitial markings, likely smoking-related. No focal consolidation, pleural effusion, or pneumothorax. Small oval density at the peripheral right lung base likely reflects overlapping shadows from the overlying anterior seventh rib, as there is no discrete correlate on the lateral view. No acute osseous abnormality. IMPRESSION: COPD.  No active cardiopulmonary disease. Electronically Signed   By: Titus Dubin M.D.   On: 03/21/2017 16:02   Mm Digital Screening Bilateral  Result Date: 03/12/2017 CLINICAL DATA:  Screening. EXAM: DIGITAL SCREENING BILATERAL MAMMOGRAM WITH CAD COMPARISON:  Previous exam(s). ACR Breast Density Category b: There are scattered areas of fibroglandular density. FINDINGS: There are no findings suspicious for malignancy. Images were processed with CAD. IMPRESSION: No mammographic evidence of malignancy. A result letter of this screening mammogram will be mailed directly to the patient. RECOMMENDATION: Screening mammogram in one year. (Code:SM-B-01Y) BI-RADS CATEGORY  1: Negative. Electronically Signed   By: Nolon Nations M.D.   On: 03/12/2017 16:10

## 2017-03-21 NOTE — ED Notes (Signed)
AAOx3.  Skin warm and dry.  NAD 

## 2017-03-21 NOTE — ED Notes (Signed)
D/w Dr. Cinda Quest, also order CXR and flu swab

## 2017-03-21 NOTE — ED Triage Notes (Signed)
Pt arrived via POV from home with reports of cough, vomiting,diarrhea and dry heaving since yesterday.  PT states she has not vomited or had diarrhea today. States only dryheaving today.  Pt states the cough is non-productive.  Pt states fever at home yesterday was 101.  Mask applied at check in.

## 2017-03-21 NOTE — ED Provider Notes (Signed)
[facility] Emergency Department Provider Note  ____________________________________________   First MD Initiated Contact with Patient 03/21/17 1612     (approximate)  I have reviewed the triage vital signs and the nursing notes.   HISTORY  Chief Complaint Cough; Emesis; and Fever   HPI Jennifer Mosley is a 62 y.o. female is self presents to the emergency department with cough vomiting diarrhea and nausea associated with vomiting that began yesterday.  She has had no vomiting or diarrhea today.  He persists with nausea today.  Her cough is nonproductive.  She had a temperature of 101 degrees at home.  Her symptoms began suddenly and have been slowly progressive.  Nothing particular seems to make them better or worse.  She has sharp upper chest pain mild severity worse with cough improved but not coughing.  She has no sick contacts.  The cough is what is been primarily bothering her.  Past Medical History:  Diagnosis Date  . Anxiety   . COPD (chronic obstructive pulmonary disease) ([place])   . Depression   . Essential hypertension   . Hyperlipidemia   . Hypertension   . Stroke ([place])   . 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 Neuroma 07/11/2015  . Plantar fasciitis 05/28/2015  . Stroke (cerebrum) ([place]) 05/16/2015  . Angina, class III ([place]) 07/02/2014  . Hyperlipidemia   . Essential hypertension     Past Surgical History:  Procedure Laterality Date  . BILATERAL CARPAL TUNNEL RELEASE    . COLONOSCOPY    . COLONOSCOPY WITH PROPOFOL N/A 03/15/2017   Procedure: COLONOSCOPY WITH PROPOFOL;  Surgeon: [name], MD;  Location: [facility] ENDOSCOPY;  Service: Gastroenterology;  Laterality: N/A;  . ECTOPIC PREGNANCY SURGERY      Prior to Admission medications   Medication Sig Start Date End Date Taking? Authorizing Provider  aspirin 81 MG tablet Take 81 mg by mouth daily.     [provider]  benzonatate (TESSALON PERLES) 100 MG capsule Take 1 capsule (100 mg total) by mouth every 6 (six) hours as needed for cough. 03/21/17 03/21/18  Darel Hong, MD  clopidogrel (PLAVIX) 75 MG tablet Take 1 tablet (75 mg total) by mouth daily. 05/17/15   Epifanio Lesches, MD  cyclobenzaprine (FLEXERIL) 10 MG tablet Take 1 tablet (10 mg total) by mouth 3 (three) times daily as needed for muscle spasms. 08/26/16   Cuthriell, Charline Bills, PA-C  enalapril (VASOTEC) 10 MG tablet Take 1 tablet (10 mg total) by mouth daily. 10/16/14   Wellington Hampshire, MD  esomeprazole (NEXIUM) 40 MG capsule Take 40 mg by mouth daily at 12 noon.    [provider]  fenoprofen (NALFON) 600 MG TABS tablet Take 600 mg by mouth daily.    [provider]  meloxicam (MOBIC) 15 MG tablet Take 1 tablet (15 mg total) by mouth daily. 08/26/16   Cuthriell, Charline Bills, PA-C  naproxen (NAPROSYN) 500 MG tablet Take 1 tablet (500 mg total) by mouth 2 (two) times daily with a meal. 03/21/17 03/21/18  Darel Hong, MD  oseltamivir (TAMIFLU) 75 MG capsule Take 1 capsule (75 mg total) by mouth 2 (two) times daily for 5 days. 03/21/17 03/26/17  Darel Hong, MD  predniSONE (DELTASONE) 50 MG tablet Take 1 tablet (50 mg total) by mouth daily for 4 days. 03/21/17 03/25/17  Darel Hong, MD    Allergies Patient has no known  allergies.  Family History  Problem Relation Age of Onset  . Hypertension Mother   . Hyperlipidemia Mother   . Breast cancer Cousin     Social History Social History   Tobacco Use  . Smoking status: Former Smoker    Packs/day: 0.50    Years: 37.00    Pack years: 18.50    Types: Cigarettes    Last attempt to quit: 03/16/2015    Years since quitting: 2.0  . Smokeless tobacco: Never Used  Substance Use Topics  . Alcohol use: Yes    Comment: Rare.   . Drug use: No    Review of Systems Constitutional: Positive for fevers Eyes: No visual changes. ENT: No sore  throat. Cardiovascular: Positive for chest pain. Respiratory: Positive for shortness of breath. Gastrointestinal: Positive for abdominal pain.  Positive for nausea, positive for vomiting.  Positive for diarrhea.  No constipation. Genitourinary: Negative for dysuria. Musculoskeletal: Negative for back pain. Skin: Negative for rash. Neurological: Negative for headaches, focal weakness or numbness.   ____________________________________________   PHYSICAL EXAM:  VITAL SIGNS: ED Triage Vitals  Enc Vitals Group     BP 03/21/17 1448 (!) 141/79     Pulse Rate 03/21/17 1448 78     Resp 03/21/17 1448 18     Temp 03/21/17 1448 99.5 F (37.5 C)     Temp Source 03/21/17 1448 Oral     SpO2 03/21/17 1448 95 %     Weight --      Height --      Head Circumference --      Peak Flow --      Pain Score 03/21/17 1452 8     Pain Loc --      Pain Edu? --      Excl. in [place]? --     Constitutional: Alert and oriented x4 appears somewhat uncomfortable nontoxic no diaphoresis speaks in full clear sentences Eyes: PERRL EOMI. Head: Atraumatic. Nose: Some congestion. Mouth/Throat: No trismus Neck: No stridor.   Cardiovascular: Normal rate, regular rhythm. Grossly normal heart sounds.  Good peripheral circulation. Respiratory: Slightly increased respiratory effort.  Mild wheeze in all fields although moving good air Gastrointestinal: Soft nontender Musculoskeletal: No lower extremity edema   Neurologic:  Normal speech and language. No gross focal neurologic deficits are appreciated. Skin:  Skin is warm, dry and intact. No rash noted. Psychiatric: Mood and affect are normal. Speech and behavior are normal.    ____________________________________________   DIFFERENTIAL includes but not limited to  Pneumonia, pneumothorax, acute coronary syndrome, pericarditis, myocarditis, influenza, influenza-like illness ____________________________________________   LABS (all labs ordered are listed, but  only abnormal results are displayed)  Labs Reviewed  COMPREHENSIVE METABOLIC PANEL - Abnormal; Notable for the following components:      Result Value   Sodium 134 (*)    Glucose, Bld 214 (*)    Calcium 8.3 (*)    Albumin 2.9 (*)    AST 100 (*)    ALT 67 (*)    Alkaline Phosphatase 181 (*)    All other components within normal limits  CBC - Abnormal; Notable for the following components:   WBC 3.3 (*)    Platelets 118 (*)    All other components within normal limits  INFLUENZA PANEL BY PCR (TYPE A & B) - Abnormal; Notable for the following components:   Influenza A By PCR POSITIVE (*)    All other components within normal limits  LIPASE, BLOOD    Lab  work reviewed by me elevated glucose but no signs of DKA along with influenza A positive __________________________________________  EKG   ____________________________________________  RADIOLOGY  Chest x-ray reviewed by me with chronic changes but no acute disease ____________________________________________   PROCEDURES  Procedure(s) performed: no  Procedures  Critical Care performed: no  Observation: no ____________________________________________   INITIAL IMPRESSION / ASSESSMENT AND PLAN / ED COURSE  Pertinent labs & imaging results that were available during my care of the patient were reviewed by me and considered in my medical decision making (see chart for details).  The patient is relatively well-appearing and saturating well on room air.  She is influenza A positive and is within the 48-hour window for treatment with Tamiflu.  Given her history of COPD I think is also reasonable to treat her with a 5-day course of prednisone and naproxen instead of ibuprofen for the antiviral properties.  Strict return precautions have been given and the patient verbalizes understanding and agreement with the plan.      ____________________________________________   FINAL CLINICAL IMPRESSION(S) / ED  DIAGNOSES  Final diagnoses:  Influenza A  Chronic obstructive pulmonary disease, unspecified COPD type ([place])      NEW MEDICATIONS STARTED DURING THIS VISIT:  Discharge Medication List as of 03/21/2017  4:18 PM    START taking these medications   Details  benzonatate (TESSALON PERLES) 100 MG capsule Take 1 capsule (100 mg total) by mouth every 6 (six) hours as needed for cough., Starting Sun 03/21/2017, Until Mon 03/21/2018, Print    naproxen (NAPROSYN) 500 MG tablet Take 1 tablet (500 mg total) by mouth 2 (two) times daily with a meal., Starting Sun 03/21/2017, Until Mon 03/21/2018, Print    oseltamivir (TAMIFLU) 75 MG capsule Take 1 capsule (75 mg total) by mouth 2 (two) times daily for 5 days., Starting Sun 03/21/2017, Until Fri 03/26/2017, Print    predniSONE (DELTASONE) 50 MG tablet Take 1 tablet (50 mg total) by mouth daily for 4 days., Starting Sun 03/21/2017, Until Thu 03/25/2017, Print         Note:  This document was prepared using Dragon voice recognition software and may include unintentional dictation errors.     Darel Hong, MD 03/25/17 1258

## 2017-07-05 ENCOUNTER — Other Ambulatory Visit: Payer: Self-pay | Admitting: Family Medicine

## 2017-07-05 ENCOUNTER — Ambulatory Visit
Admission: RE | Admit: 2017-07-05 | Discharge: 2017-07-05 | Disposition: A | Discharge status: Home / Self Care | Payer: Medicare Other | Source: Ambulatory Visit | Attending: Family Medicine | Admitting: Family Medicine

## 2017-07-05 DIAGNOSIS — M546 Pain in thoracic spine: Secondary | ICD-10-CM

## 2017-07-05 DIAGNOSIS — M47814 Spondylosis without myelopathy or radiculopathy, thoracic region: Secondary | ICD-10-CM | POA: Diagnosis not present

## 2018-02-11 ENCOUNTER — Other Ambulatory Visit: Payer: Self-pay | Admitting: Family Medicine

## 2018-02-11 DIAGNOSIS — M79621 Pain in right upper arm: Secondary | ICD-10-CM

## 2018-03-01 ENCOUNTER — Telehealth: Payer: Self-pay | Admitting: Gastroenterology

## 2018-03-01 ENCOUNTER — Other Ambulatory Visit: Payer: Self-pay

## 2018-03-01 DIAGNOSIS — Z86010 Personal history of colonic polyps: Secondary | ICD-10-CM

## 2018-03-01 NOTE — Telephone Encounter (Signed)
Patients call has been returned.  She has been scheduled for her colonoscopy with Dr. Vicente Males.  This is a one year repeat due to history of colon polyps.  Scheduled for 03/29/18.  Thanks [employer]

## 2018-03-01 NOTE — Telephone Encounter (Signed)
Pt is calling to schedule a colonoscopy with  Dr. Vicente Males

## 2018-03-02 ENCOUNTER — Other Ambulatory Visit: Payer: Self-pay | Admitting: Family Medicine

## 2018-03-02 DIAGNOSIS — R928 Other abnormal and inconclusive findings on diagnostic imaging of breast: Secondary | ICD-10-CM

## 2018-03-02 DIAGNOSIS — N644 Mastodynia: Secondary | ICD-10-CM

## 2018-03-02 DIAGNOSIS — M79621 Pain in right upper arm: Secondary | ICD-10-CM

## 2018-03-15 ENCOUNTER — Ambulatory Visit
Admission: RE | Admit: 2018-03-15 | Discharge: 2018-03-15 | Disposition: A | Discharge status: Home / Self Care | Payer: Medicare Other | Source: Ambulatory Visit | Attending: Family Medicine | Admitting: Family Medicine

## 2018-03-15 DIAGNOSIS — N644 Mastodynia: Secondary | ICD-10-CM | POA: Insufficient documentation

## 2018-03-15 DIAGNOSIS — M79621 Pain in right upper arm: Secondary | ICD-10-CM

## 2018-03-15 DIAGNOSIS — R928 Other abnormal and inconclusive findings on diagnostic imaging of breast: Secondary | ICD-10-CM | POA: Diagnosis present

## 2018-03-28 ENCOUNTER — Encounter: Payer: Self-pay | Admitting: *Deleted

## 2018-03-29 ENCOUNTER — Encounter
Admission: RE | Disposition: A | Discharge status: Home / Self Care | Payer: Self-pay | Source: Home / Self Care | Attending: Gastroenterology

## 2018-03-29 ENCOUNTER — Ambulatory Visit: Payer: Medicare Other | Admitting: Anesthesiology

## 2018-03-29 ENCOUNTER — Ambulatory Visit
Admission: RE | Admit: 2018-03-29 | Discharge: 2018-03-29 | Disposition: A | Discharge status: Home / Self Care | Payer: Medicare Other | Attending: Gastroenterology | Admitting: Gastroenterology

## 2018-03-29 ENCOUNTER — Other Ambulatory Visit: Payer: Self-pay

## 2018-03-29 DIAGNOSIS — Z1211 Encounter for screening for malignant neoplasm of colon: Secondary | ICD-10-CM | POA: Diagnosis present

## 2018-03-29 DIAGNOSIS — Z86010 Personal history of colonic polyps: Secondary | ICD-10-CM | POA: Insufficient documentation

## 2018-03-29 DIAGNOSIS — Z791 Long term (current) use of non-steroidal anti-inflammatories (NSAID): Secondary | ICD-10-CM | POA: Diagnosis not present

## 2018-03-29 DIAGNOSIS — D122 Benign neoplasm of ascending colon: Secondary | ICD-10-CM | POA: Diagnosis not present

## 2018-03-29 DIAGNOSIS — J449 Chronic obstructive pulmonary disease, unspecified: Secondary | ICD-10-CM | POA: Diagnosis not present

## 2018-03-29 DIAGNOSIS — K5730 Diverticulosis of large intestine without perforation or abscess without bleeding: Secondary | ICD-10-CM | POA: Diagnosis not present

## 2018-03-29 DIAGNOSIS — Z79899 Other long term (current) drug therapy: Secondary | ICD-10-CM | POA: Insufficient documentation

## 2018-03-29 DIAGNOSIS — I10 Essential (primary) hypertension: Secondary | ICD-10-CM | POA: Insufficient documentation

## 2018-03-29 DIAGNOSIS — E11610 Type 2 diabetes mellitus with diabetic neuropathic arthropathy: Secondary | ICD-10-CM | POA: Diagnosis not present

## 2018-03-29 DIAGNOSIS — Z7982 Long term (current) use of aspirin: Secondary | ICD-10-CM | POA: Insufficient documentation

## 2018-03-29 DIAGNOSIS — D125 Benign neoplasm of sigmoid colon: Secondary | ICD-10-CM | POA: Insufficient documentation

## 2018-03-29 DIAGNOSIS — Z87891 Personal history of nicotine dependence: Secondary | ICD-10-CM | POA: Insufficient documentation

## 2018-03-29 HISTORY — PX: COLONOSCOPY WITH PROPOFOL: SHX5780

## 2018-03-29 LAB — GLUCOSE, CAPILLARY: Glucose-Capillary: 129 mg/dL — ABNORMAL HIGH (ref 70–99)

## 2018-03-29 SURGERY — COLONOSCOPY WITH PROPOFOL
Anesthesia: General

## 2018-03-29 MED ORDER — PROPOFOL 500 MG/50ML IV EMUL
INTRAVENOUS | Status: DC | PRN
  Administered 2018-03-29: 150 ug/kg/min via INTRAVENOUS

## 2018-03-29 MED ORDER — LIDOCAINE HCL (CARDIAC) PF 100 MG/5ML IV SOSY
PREFILLED_SYRINGE | INTRAVENOUS | Status: DC | PRN
  Administered 2018-03-29: 50 mg via INTRAVENOUS

## 2018-03-29 MED ORDER — PHENYLEPHRINE HCL 10 MG/ML IJ SOLN
INTRAMUSCULAR | Status: DC | PRN
  Administered 2018-03-29: 100 ug via INTRAVENOUS

## 2018-03-29 MED ORDER — SODIUM CHLORIDE 0.9 % IV SOLN
INTRAVENOUS | Status: DC
  Administered 2018-03-29: 1000 mL via INTRAVENOUS

## 2018-03-29 MED ORDER — PROPOFOL 10 MG/ML IV BOLUS
INTRAVENOUS | Status: DC | PRN
  Administered 2018-03-29: 10 mg via INTRAVENOUS
  Administered 2018-03-29: 20 mg via INTRAVENOUS
  Administered 2018-03-29: 50 mg via INTRAVENOUS
  Administered 2018-03-29: 20 mg via INTRAVENOUS

## 2018-03-29 MED ORDER — LIDOCAINE HCL (PF) 2 % IJ SOLN
INTRAMUSCULAR | Status: AC
  Filled 2018-03-29: qty 10

## 2018-03-29 MED ORDER — PROPOFOL 500 MG/50ML IV EMUL
INTRAVENOUS | Status: AC
  Filled 2018-03-29: qty 50

## 2018-03-29 NOTE — Transfer of Care (Signed)
Immediate Anesthesia Transfer of Care Note  Patient: Jennifer Mosley  Procedure(s) Performed: COLONOSCOPY WITH PROPOFOL (N/A )  Patient Location: PACU  Anesthesia Type:General  Level of Consciousness: drowsy  Airway & Oxygen Therapy: Patient Spontanous Breathing and Patient connected to nasal cannula oxygen  Post-op Assessment: Report given to RN and Post -op Vital signs reviewed and stable  Post vital signs: Reviewed and stable  Last Vitals:  Vitals Value Taken Time  BP 128/60 03/29/2018  9:06 AM  Temp 36.2 C 03/29/2018  9:06 AM  Pulse 69 03/29/2018  9:07 AM  Resp 17 03/29/2018  9:07 AM  SpO2 99 % 03/29/2018  9:07 AM  Vitals shown include unvalidated device data.  Last Pain:  Vitals:   03/29/18 0906  TempSrc: Tympanic  PainSc:          Complications: No apparent anesthesia complications

## 2018-03-29 NOTE — H&P (Signed)
[name], MD [address] [place], [place], [place], [address] [address], [place], [place], [place], [address] Phone: [phone]  Fax: [phone]  Primary Care Physician:  [name], MD   Pre-Procedure History & Physical: HPI:  LICHELLE VIETS is a 63 y.o. female is here for an colonoscopy.   Past Medical History:  Diagnosis Date  . Anxiety   . COPD (chronic obstructive pulmonary disease) ([place])   . Depression   . Essential hypertension   . Hyperlipidemia   . Hypertension   . Stroke ([place])   . 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 BILATERAL CARPAL TUNNEL RELEASE    . COLONOSCOPY    . COLONOSCOPY WITH PROPOFOL N/A 03/15/2017   Procedure: COLONOSCOPY WITH PROPOFOL;  Surgeon: [name], MD;  Location: [facility] ENDOSCOPY;  Service: Gastroenterology;  Laterality: N/A;  . ECTOPIC PREGNANCY SURGERY      Prior to Admission medications   Medication Sig Start Date End Date Taking? Authorizing Provider  aspirin 81 MG tablet Take 81 mg by mouth daily.   Yes [provider]  clopidogrel (PLAVIX) 75 MG tablet Take 1 tablet (75 mg total) by mouth daily. 05/17/15  Yes Epifanio Lesches, MD  cyclobenzaprine (FLEXERIL) 10 MG tablet Take 1 tablet (10 mg total) by mouth 3 (three) times daily as needed for muscle spasms. 08/26/16  Yes Cuthriell, Charline Bills, PA-C  enalapril (VASOTEC) 10 MG tablet Take 1 tablet (10 mg total) by mouth daily. 10/16/14  Yes Wellington Hampshire, MD  esomeprazole (NEXIUM) 40 MG capsule Take 40 mg by mouth daily at 12 noon.   Yes [provider]  fenoprofen (NALFON) 600 MG TABS tablet Take 600 mg by mouth daily.   Yes [provider]  meloxicam (MOBIC) 15 MG tablet Take 1 tablet (15 mg total) by mouth daily. 08/26/16  Yes Cuthriell, Charline Bills, PA-C    Allergies as of 03/01/2018  . (No Known Allergies)    Family History  Problem Relation Age of  Onset  . Hypertension Mother   . Hyperlipidemia Mother   . Breast cancer Cousin     Social History   Socioeconomic History  . Marital status: Widowed    Spouse name: Not on file  . Number of children: Not on file  . Years of education: Not on file  . Highest education level: Not on file  Occupational History  . Not on file  Social Needs  . Financial resource strain: Not on file  . Food insecurity:    Worry: Not on file    Inability: Not on file  . Transportation needs:    Medical: Not on file    Non-medical: Not on file  Tobacco Use  . Smoking status: Former Smoker    Packs/day: 0.50    Years: 37.00    Pack years: 18.50    Types: Cigarettes    Last attempt to quit: 03/16/2015    Years since quitting: 3.0  . Smokeless tobacco: Never Used  Substance and Sexual Activity  . Alcohol use: Yes    Comment: Rare.   . Drug use: No  . Sexual activity: Not on file  Lifestyle  . Physical activity:    Days per week: Not on file    Minutes per session: Not on file  . Stress: Not on file  Relationships  . Social connections:    Talks on phone: Not  on file    Gets together: Not on file    Attends religious service: Not on file    Active member of club or organization: Not on file    Attends meetings of clubs or organizations: Not on file    Relationship status: Not on file  . Intimate partner violence:    Fear of current or ex partner: Not on file    Emotionally abused: Not on file    Physically abused: Not on file    Forced sexual activity: Not on file  Other Topics Concern  . Not on file  Social History Narrative  . Not on file    Review of Systems: See HPI, otherwise negative ROS  Physical Exam: BP (!) 184/92   Pulse 74   Temp 98.6 F (37 C) (Tympanic)   Ht 5\' 4"  (1.626 m)   Wt 99.8 kg   SpO2 96%   BMI 37.76 kg/m  General:   Alert,  pleasant and cooperative in NAD Head:  Normocephalic and atraumatic. Neck:  Supple; no masses or thyromegaly. Lungs:  Clear  throughout to auscultation, normal respiratory effort.    Heart:  +S1, +S2, Regular rate and rhythm, No edema. Abdomen:  Soft, nontender and nondistended. Normal bowel sounds, without guarding, and without rebound.   Neurologic:  Alert and  oriented x4;  grossly normal neurologically.  Impression/Plan: ALYSSAMARIE MOUNSEY is here for an colonoscopy to be performed for surveillance due to prior history of colon polyps   Risks, benefits, limitations, and alternatives regarding  colonoscopy have been reviewed with the patient.  Questions have been answered.  All parties agreeable.   [name], MD  03/29/2018, 8:22 AM

## 2018-03-29 NOTE — Anesthesia Preprocedure Evaluation (Signed)
Anesthesia Evaluation  Patient identified by MRN, date of birth, ID band Patient awake    Reviewed: Allergy & Precautions, NPO status , Patient's Chart, lab work & pertinent test results  History of Anesthesia Complications Negative for: history of anesthetic complications  Airway Mallampati: II  TM Distance: >3 FB Neck ROM: Full    Dental  (+) Upper Dentures, Lower Dentures   Pulmonary neg sleep apnea, COPD, former smoker,    breath sounds clear to auscultation- rhonchi (-) wheezing      Cardiovascular hypertension, (-) CAD, (-) Past MI, (-) Cardiac Stents and (-) CABG  Rhythm:Regular Rate:Normal - Systolic murmurs and - Diastolic murmurs    Neuro/Psych PSYCHIATRIC DISORDERS Anxiety Depression CVA, No Residual Symptoms    GI/Hepatic negative GI ROS, Neg liver ROS,   Endo/Other  diabetes  Renal/GU negative Renal ROS     Musculoskeletal negative musculoskeletal ROS (+)   Abdominal (+) + obese,   Peds  Hematology negative hematology ROS (+)   Anesthesia Other Findings Past Medical History: No date: Anxiety No date: COPD (chronic obstructive pulmonary disease) ([facility]) No date: Depression No date: Essential hypertension No date: Hyperlipidemia No date: Hypertension No date: Stroke [facility]) No date: 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)   Reproductive/Obstetrics                             Anesthesia Physical Anesthesia Plan  ASA: III  Anesthesia Plan: General   Post-op Pain Management:    Induction: Intravenous  PONV Risk Score and Plan: 2 and Propofol infusion  Airway Management Planned: Natural Airway  Additional Equipment:   Intra-op Plan:   Post-operative Plan:   Informed Consent: I have reviewed the patients History and Physical, chart, labs and discussed the procedure including the risks, benefits and alternatives for the proposed anesthesia with the  patient or authorized representative who has indicated his/her understanding and acceptance.     Dental advisory given  Plan Discussed with: CRNA and Anesthesiologist  Anesthesia Plan Comments:         Anesthesia Quick Evaluation

## 2018-03-29 NOTE — Anesthesia Post-op Follow-up Note (Signed)
Anesthesia QCDR form completed.        

## 2018-03-29 NOTE — Anesthesia Postprocedure Evaluation (Signed)
Anesthesia Post Note  Patient: Jennifer Mosley  Procedure(s) Performed: COLONOSCOPY WITH PROPOFOL (N/A )  Patient location during evaluation: Endoscopy Anesthesia Type: General Level of consciousness: awake and alert and oriented Pain management: pain level controlled Vital Signs Assessment: post-procedure vital signs reviewed and stable Respiratory status: spontaneous breathing, nonlabored ventilation and respiratory function stable Cardiovascular status: blood pressure returned to baseline and stable Postop Assessment: no signs of nausea or vomiting Anesthetic complications: no     Last Vitals:  Vitals:   03/29/18 0906 03/29/18 0916  BP: 128/60 (!) 142/67  Pulse: 69 67  Resp: 16 16  Temp: (!) 36.2 C   SpO2: 98% 97%    Last Pain:  Vitals:   03/29/18 0916  TempSrc:   PainSc: 0-No pain                 Kenyotta Dorfman

## 2018-03-29 NOTE — Op Note (Signed)
[facility] Gastroenterology Patient Name: Jennifer Mosley Procedure Date: 03/29/2018 8:23 AM MRN: [id number] Account #: [id number] Date of Birth: 26-Apr-1955 Admit Type: Outpatient Age: 63 Room: [facility] ENDO ROOM 1 Gender: Female Note Status: Finalized Procedure:            Colonoscopy Indications:          High risk colon cancer surveillance: Personal history                        of colonic polyps Providers:            [name] MD, MD Medicines:            Monitored Anesthesia Care Complications:        No immediate complications. Procedure:            Pre-Anesthesia Assessment:                       - Prior to the procedure, a History and Physical was                        performed, and patient medications, allergies and                        sensitivities were reviewed. The patient's tolerance of                        previous anesthesia was reviewed.                       - The risks and benefits of the procedure and the                        sedation options and risks were discussed with the                        patient. All questions were answered and informed                        consent was obtained.                       - ASA Grade Assessment: II - A patient with mild                        systemic disease.                       After obtaining informed consent, the colonoscope was                        passed under direct vision. Throughout the procedure,                        the patient's blood pressure, pulse, and oxygen                        saturations were monitored continuously. The                        Colonoscope was introduced through the anus and  advanced to the the cecum, identified by appendiceal                        orifice and ileocecal valve. The colonoscopy was                        performed with ease. The patient tolerated the                        procedure well. The quality of the bowel  preparation                        was good. Findings:      The perianal and digital rectal examinations were normal.      Multiple sessile polyps were found in the sigmoid colon and ascending       colon. The polyps were 3 to 5 mm in size. These polyps were removed with       a cold snare. Resection and retrieval were complete.      The exam was otherwise without abnormality on direct and retroflexion       views.      Multiple small-mouthed diverticula were found in the sigmoid colon. Impression:           - Multiple 3 to 5 mm polyps in the sigmoid colon and in                        the ascending colon, removed with a cold snare.                        Resected and retrieved.                       - The examination was otherwise normal on direct and                        retroflexion views. Recommendation:       - Discharge patient to home (with escort).                       - Resume previous diet.                       - Continue present medications.                       - Await pathology results.                       - Repeat colonoscopy in 1 year for surveillance. Procedure Code(s):    --- Professional ---                       [phone], Colonoscopy, flexible; with removal of tumor(s),                        polyp(s), or other lesion(s) by snare technique Diagnosis Code(s):    --- Professional ---                       Z86.010, Personal history of colonic polyps  D12.5, Benign neoplasm of sigmoid colon                       D12.2, Benign neoplasm of ascending colon CPT copyright 2018 American Medical Association. All rights reserved. The codes documented in this report are preliminary and upon coder review may  be revised to meet current compliance requirements. [name], MD [name] MD, MD 03/29/2018 9:04:46 AM This report has been signed electronically. Number of Addenda: 0 Note Initiated On: 03/29/2018 8:23 AM Scope Withdrawal Time: 0 hours 15  minutes 7 seconds  Total Procedure Duration: 0 hours 23 minutes 7 seconds       [facility]

## 2018-03-30 ENCOUNTER — Encounter: Payer: Self-pay | Admitting: Gastroenterology

## 2018-03-30 LAB — SURGICAL PATHOLOGY

## 2018-04-01 ENCOUNTER — Encounter: Payer: Self-pay | Admitting: Gastroenterology

## 2018-04-04 ENCOUNTER — Telehealth: Payer: Self-pay

## 2018-04-04 NOTE — Telephone Encounter (Signed)
Spoke with pt and informed her of biopsy results and Dr. Georgeann Oppenheim instructions to repeat the colonoscopy in 1 year. Pt understands and agrees. Pt has been added to the recall list.

## 2018-04-04 NOTE — Telephone Encounter (Signed)
Called pt to inform her of biopsy results and Dr. Georgeann Oppenheim instructions for pt to repeat colonoscopy in 1 year.  Unable to contact, LVM to return call

## 2018-04-04 NOTE — Telephone Encounter (Signed)
Patient returned Jadijah's call °

## 2018-04-04 NOTE — Telephone Encounter (Signed)
-----   Message from [name], MD sent at 04/01/2018  8:19 AM EDT ----- Sherald Hess inform needs colonoscopy in1 year due to multiple polyps fouind which were pre cancerous

## 2018-08-24 ENCOUNTER — Ambulatory Visit
Admission: RE | Admit: 2018-08-24 | Discharge: 2018-08-24 | Disposition: A | Discharge status: Home / Self Care | Payer: Medicare Other | Source: Ambulatory Visit | Attending: Family Medicine | Admitting: Family Medicine

## 2018-08-24 ENCOUNTER — Other Ambulatory Visit: Payer: Self-pay | Admitting: Family Medicine

## 2018-08-24 DIAGNOSIS — R52 Pain, unspecified: Secondary | ICD-10-CM

## 2019-02-15 ENCOUNTER — Other Ambulatory Visit: Payer: Self-pay | Admitting: Family Medicine

## 2019-02-15 DIAGNOSIS — Z1231 Encounter for screening mammogram for malignant neoplasm of breast: Secondary | ICD-10-CM

## 2019-03-17 ENCOUNTER — Ambulatory Visit
Admission: RE | Admit: 2019-03-17 | Discharge: 2019-03-17 | Disposition: A | Discharge status: Home / Self Care | Payer: Medicare Other | Source: Ambulatory Visit | Attending: Family Medicine | Admitting: Family Medicine

## 2019-03-17 DIAGNOSIS — Z1231 Encounter for screening mammogram for malignant neoplasm of breast: Secondary | ICD-10-CM | POA: Insufficient documentation

## 2019-04-06 ENCOUNTER — Other Ambulatory Visit: Payer: Self-pay | Admitting: Family Medicine

## 2019-04-06 DIAGNOSIS — R1013 Epigastric pain: Secondary | ICD-10-CM

## 2019-04-13 ENCOUNTER — Ambulatory Visit
Admission: RE | Admit: 2019-04-13 | Discharge: 2019-04-13 | Disposition: A | Discharge status: Home / Self Care | Payer: Medicare Other | Source: Ambulatory Visit | Attending: Family Medicine | Admitting: Family Medicine

## 2019-04-13 ENCOUNTER — Other Ambulatory Visit: Payer: Self-pay

## 2019-04-13 DIAGNOSIS — R1013 Epigastric pain: Secondary | ICD-10-CM | POA: Diagnosis present

## 2019-04-26 ENCOUNTER — Other Ambulatory Visit: Payer: Self-pay

## 2019-04-26 ENCOUNTER — Ambulatory Visit (INDEPENDENT_AMBULATORY_CARE_PROVIDER_SITE_OTHER): Payer: Medicare Other | Admitting: Gastroenterology

## 2019-04-26 VITALS — BP 150/86 | HR 87 | Temp 97.9°F | Ht 64.0 in | Wt 235.6 lb

## 2019-04-26 DIAGNOSIS — R0789 Other chest pain: Secondary | ICD-10-CM

## 2019-04-26 DIAGNOSIS — R93429 Abnormal radiologic findings on diagnostic imaging of unspecified kidney: Secondary | ICD-10-CM | POA: Diagnosis not present

## 2019-04-26 DIAGNOSIS — Z86010 Personal history of colonic polyps: Secondary | ICD-10-CM

## 2019-04-26 DIAGNOSIS — K7469 Other cirrhosis of liver: Secondary | ICD-10-CM

## 2019-04-26 NOTE — Progress Notes (Signed)
[name] MD, MRCP(U.K) [address]  [place], [place] [address]  Main: [phone]  Fax: [phone]   Gastroenterology Consultation  Referring Provider:     [name], MD Primary Care Physician:  [name], MD Primary Gastroenterologist:  Dr. [name]  Reason for Consultation:    Abdominal pain        HPI:   Jennifer Mosley is a 64 y.o. y/o female referred for consultation & management  by Dr. Lennox Grumbles, Connye Burkitt, MD.    Referred for abdominal pain in February 2021.  She states that a few weeks back she woke up all of a sudden in the night with her deep pressure over the center of her chest.  She had been on Nexium at that point of time which she has been taking for many years for reflux but this is something different.  It was nonradiating.  She has 2 sisters 71 and 17 of age who have coronary artery disease and have had heart attacks.  She has not had any prior cardiac evaluation per her recollection.  She also complains of nonspecific generalized abdominal discomfort on and off for a long time, nonradiating, no aggravating or relieving factors.  Denies any NSAID use.  03/15/2017 colonoscopy: Performed by Lennox Grumbles multiple polyps were resected in the sigmoid transverse rectum and ascending colon majority of which were a combination of tubular adenomas and hyperplastic polyps. 03/29/2018 colonoscopy 3 sessile serrated polyps resected.  Plan was to repeat colonoscopy in 1 years time due to multiple polyps seen on multiple occasions.   04/13/2019 ultrasound abdomen shows slightly nodular hepatic contour suggesting cirrhosis.  Complex nodule at the upper left kidney MR without and with contrast recommended.   Past Medical History:  Diagnosis Date  . Anxiety   . COPD (chronic obstructive pulmonary disease) ([place])   . Depression   . Essential hypertension   . Hyperlipidemia   . Hypertension   . Stroke ([place])   . 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 BILATERAL CARPAL TUNNEL RELEASE    . COLONOSCOPY    . COLONOSCOPY WITH PROPOFOL N/A 03/15/2017   Procedure: COLONOSCOPY WITH PROPOFOL;  Surgeon: [name], MD;  Location: [facility] ENDOSCOPY;  Service: Gastroenterology;  Laterality: N/A;  . COLONOSCOPY WITH PROPOFOL N/A 03/29/2018   Procedure: COLONOSCOPY WITH PROPOFOL;  Surgeon: [name], MD;  Location: [facility] ENDOSCOPY;  Service: Gastroenterology;  Laterality: N/A;  . ECTOPIC PREGNANCY SURGERY      Prior to Admission medications   Medication Sig Start Date End Date Taking? Authorizing Provider  aspirin 81 MG tablet Take 81 mg by mouth daily.    [provider]  clopidogrel (PLAVIX) 75 MG tablet Take 1 tablet (75 mg total) by mouth daily. 05/17/15   Epifanio Lesches, MD  cyclobenzaprine (FLEXERIL) 10 MG tablet Take 1 tablet (10 mg total) by mouth 3 (three) times daily as needed for muscle spasms. 08/26/16   Cuthriell, Charline Bills, PA-C  enalapril (VASOTEC) 10 MG tablet Take 1 tablet (10 mg total) by mouth daily. 10/16/14   Wellington Hampshire, MD  esomeprazole (NEXIUM) 40 MG capsule Take 40 mg by mouth daily at 12 noon.    [provider]  fenoprofen (NALFON) 600 MG TABS tablet Take 600 mg by mouth daily.    [provider]  meloxicam (MOBIC) 15 MG tablet Take 1 tablet (15 mg total)  by mouth daily. 08/26/16   Cuthriell, Charline Bills, PA-C    Family History  Problem Relation Age of Onset  . Hypertension Mother   . Hyperlipidemia Mother   . Breast cancer Cousin      Social History   Tobacco Use  . Smoking status: Former Smoker    Packs/day: 0.50    Years: 37.00    Pack years: 18.50    Types: Cigarettes    Quit date: 03/16/2015    Years since quitting: 4.1  . Smokeless tobacco: Never Used  Substance Use Topics  . Alcohol use: Yes    Comment: Rare.   . Drug use: No    Allergies as of 04/26/2019  . (No Known Allergies)    Review  of Systems:    All systems reviewed and negative except where noted in HPI.   Physical Exam:  There were no vitals taken for this visit. No LMP recorded. Patient is postmenopausal. Psych:  Alert and cooperative. Normal mood and affect. General:   Alert,  Well-developed, well-nourished, pleasant and cooperative in NAD Head:  Normocephalic and atraumatic. Eyes:  Sclera clear, no icterus.   Conjunctiva pink. Ears:  Normal auditory acuity. Abdomen:  Normal bowel sounds.  No bruits.  Soft, non-tender and non-distended without masses, hepatosplenomegaly or hernias noted.  No guarding or rebound tenderness.    Neurologic:  Alert and oriented x3;  grossly normal neurologically. Skin:  Intact without significant lesions or rashes. No jaundice. Psych:  Alert and cooperative. Normal mood and affect.  Imaging Studies: US Abdomen Complete  Result Date: 04/13/2019 CLINICAL DATA:  Epigastric abdominal pain, chronic RIGHT upper quadrant pain EXAM: ABDOMEN ULTRASOUND COMPLETE COMPARISON:  12/30/2015 FINDINGS: Gallbladder: Normally distended without stones or wall thickening. No pericholecystic fluid or sonographic Murphy sign. Common bile duct: Diameter: 2 mm, normal Liver: Coarsened increased hepatic echogenicity with slightly nodular hepatic margins raising question of cirrhosis. No focal hepatic mass lesion. Portal vein is patent on color Doppler imaging with normal direction of blood flow towards the liver. IVC: Normal appearance Pancreas: Predominantly obscured by bowel gas Spleen: Upper normal size at 12.8 cm length with calculated volume of 367 mL Right Kidney: Length: 11.4 cm. Normal morphology without mass or hydronephrosis. Left Kidney: Length: 11.7 cm. Small hypoechoic nodule at upper pole 18 x 17 x 20 mm question complicated cyst versus hypoechoic solid lesion. No hydronephrosis. Abdominal aorta: Normal caliber Other findings: No free fluid IMPRESSION: Coarsened increased hepatic echogenicity with  slightly nodular hepatic contour suggesting cirrhosis. Nonvisualization of pancreas. Complex hypoechoic nodule at upper pole LEFT kidney 20 mm greatest size; characterization of this lesion by MR imaging with and without contrast recommended to exclude renal neoplasm. These results will be called to the ordering clinician or representative by the Radiologist Assistant, and communication documented in the PACS or [employer]. Electronically Signed   By: Lavonia Dana M.D.   On: 04/13/2019 16:06    Assessment and Plan:   Jennifer Mosley is a 63 y.o. y/o female has been referred for abdominal pain.  She has a history of chest discomfort which has some features of cardiac sounding pain.Marland Kitchen  2 sisters age 46,52 have had heart attacks at a young age.  She herself has been seen by Dr. Fletcher Anon in the past.  Last seen in 2016.   Plan 1.  Surveillance colonoscopy due to history of sessile polyps.  If multiple present on this occasion will need referral to genetic testing.  Also proceed with upper  endoscopy at the same time to examine her stomach for abdominal discomfort.  This should be done after Claudia clearance. 2.  MRI of the abdomen to evaluate abnormal appearance of kidneys on recent ultrasound. 3.  Nodular appearance of the liver concerning for cirrhosis.  Obtain CMP, CBC, INR complete autoimmune and viral hepatitis work-up.  Liver elastography.  If cirrhosis is present then will need upper endoscopy as well to screen for varices.  I have discussed alternative options, risks & benefits,  which include, but are not limited to, bleeding, infection, perforation,respiratory complication & drug reaction.  The patient agrees with this plan & written consent will be obtained.     Follow up in 6 to 8 weeks  Dr [name] MD,MRCP(U.K)

## 2019-04-27 ENCOUNTER — Other Ambulatory Visit: Payer: Self-pay

## 2019-04-27 DIAGNOSIS — R0789 Other chest pain: Secondary | ICD-10-CM

## 2019-04-27 DIAGNOSIS — R933 Abnormal findings on diagnostic imaging of other parts of digestive tract: Secondary | ICD-10-CM

## 2019-04-28 ENCOUNTER — Other Ambulatory Visit: Payer: Self-pay

## 2019-04-28 ENCOUNTER — Encounter: Payer: Self-pay | Admitting: Internal Medicine

## 2019-04-28 ENCOUNTER — Ambulatory Visit (INDEPENDENT_AMBULATORY_CARE_PROVIDER_SITE_OTHER): Payer: Medicare Other | Admitting: Internal Medicine

## 2019-04-28 VITALS — BP 148/90 | HR 74 | Ht 64.5 in | Wt 237.4 lb

## 2019-04-28 DIAGNOSIS — I10 Essential (primary) hypertension: Secondary | ICD-10-CM

## 2019-04-28 DIAGNOSIS — E785 Hyperlipidemia, unspecified: Secondary | ICD-10-CM

## 2019-04-28 DIAGNOSIS — R079 Chest pain, unspecified: Secondary | ICD-10-CM

## 2019-04-28 MED ORDER — NITROGLYCERIN 0.4 MG SL SUBL: 0.4000 mg | SUBLINGUAL_TABLET | SUBLINGUAL | 3 refills | Status: AC | PRN

## 2019-04-28 MED ORDER — METOPROLOL TARTRATE 25 MG PO TABS: 25.0000 mg | ORAL_TABLET | Freq: Two times a day (BID) | ORAL | 5 refills | Status: DC

## 2019-04-28 NOTE — H&P (View-Only) (Signed)
New Outpatient Visit Date: 04/28/2019  Referring Provider: [name], MD [address] [place],  [place] [address]  Chief Complaint: Chest pain  HPI:  Jennifer Mosley is a 64 y.o. female who is being seen today for the evaluation of chest pain at the request of Dr. Vicente Males. She has a history of hypertension, hyperlipidemia, type 2 diabetes mellitus, stroke, COPD, and anxiety.  She was seen 2 days ago by Dr. Vicente Males for evaluation of chest and abdominal pain.  Given multiple cardiac risk factors, Dr. Vicente Males was concerned about possible cardiac etiology for her symptoms.  She had been evaluated by Dr. Mariea Clonts in our practice in 2016.  Pharmacologic myocardial perfusion stress test at that time was normal.  Subsequent echocardiogram in 2017 in the setting of stroke showed mild LVH with LVEF of 55-60% and grade 1 diastolic dysfunction.  Jennifer Mosley reports at least 1 year of chest discomfort when walking or performing yard work.  She describes it as a heaviness in the center of her chest and typically stops after resting a few minutes.  She also notes intermittent sharp pain in the center of her chest that occasionally radiates up to her neck and ear.  She is always associated this with acid reflux and previously had good results using Nexium.  However, she had a severe episode of sharp substernal chest pain that woke her up within the last few weeks.  She tried taking Tums with no relief.  It gradually resolved after few minutes after she drank warm milk.  In the past, similar pain has been present after eating.  She thought that her acid reflux was worsening, prompting referral to Dr. Vicente Males for further evaluation.  Jennifer Mosley reports stable exertional dyspnea.  She notes several strokes, most recently 3 to 4 years ago.  She reports some memory deficits but otherwise no residual issues related to her prior strokes.  She has never undergone heart catheterization.  She does not recall what she was  experiencing in 2016 when she underwent stress testing.  She has chronic lower extremity edema; she denies orthopnea and PND.  --------------------------------------------------------------------------------------------------  Cardiovascular History & Procedures: Cardiovascular Problems:  Chest pain  Stroke  Risk Factors:  Stroke, hypertension, hyperlipidemia, type 2 diabetes mellitus, morbid obesity, prior tobacco use, and family history  Cath/PCI:  None  CV Surgery:  None  EP Procedures and Devices:  None  Non-Invasive Evaluation(s):  Carotid Doppler (05/17/2015): Mild bilateral internal carotid artery stenoses (1-49%).  Patent vertebral arteries with antegrade flow.  TTE (05/16/2015): Normal LV size with mild LVH.  LVEF 55-60% with grade 1 diastolic dysfunction.  Normal RV size and function.  No significant valvular abnormality.  Pharmacologic MPI (07/05/2014): Low risk study without ischemia or scar.  LVEF 74%.  Recent CV Pertinent Labs: Lab Results  Component Value Date   CHOL 262 (H) 05/16/2015   HDL 51 05/16/2015   LDLCALC 184 (H) 05/16/2015   TRIG 135 05/16/2015   CHOLHDL 5.1 05/16/2015   K 3.5 03/21/2017   K 4.2 08/14/2013   BUN 7 03/21/2017   BUN 11 08/14/2013   CREATININE 0.78 03/21/2017   CREATININE 1.01 08/14/2013    --------------------------------------------------------------------------------------------------  Past Medical History:  Diagnosis Date  . Anxiety   . COPD (chronic obstructive pulmonary disease) ([place])   . Depression   . Essential hypertension   . Hyperlipidemia   . Hypertension   . Stroke ([place])   . 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 (  [facility])     Past Surgical History:  Procedure Laterality Date  . BILATERAL CARPAL TUNNEL RELEASE    . COLONOSCOPY    . COLONOSCOPY WITH PROPOFOL N/A 03/15/2017   Procedure: COLONOSCOPY WITH PROPOFOL;  Surgeon: [name], MD;  Location: [facility] ENDOSCOPY;  Service:  Gastroenterology;  Laterality: N/A;  . COLONOSCOPY WITH PROPOFOL N/A 03/29/2018   Procedure: COLONOSCOPY WITH PROPOFOL;  Surgeon: [name], MD;  Location: [facility] ENDOSCOPY;  Service: Gastroenterology;  Laterality: N/A;  . ECTOPIC PREGNANCY SURGERY      Current Meds  Medication Sig  . albuterol (PROAIR HFA) 108 (90 Base) MCG/ACT inhaler ProAir HFA 90 mcg/actuation aerosol inhaler  INHALE 2 PUFFS BY MOUTH EVERY 4 HOURS AS NEEDED WHEEZING OR SHORTNESS OF BREATH  . aspirin 81 MG tablet Take 81 mg by mouth daily.  . enalapril (VASOTEC) 10 MG tablet Take 1 tablet (10 mg total) by mouth daily. (Patient taking differently: Take 20 mg by mouth daily. )  . esomeprazole (NEXIUM) 40 MG capsule Take 40 mg by mouth daily at 12 noon.  Marland Kitchen FLOVENT HFA 220 MCG/ACT inhaler   . furosemide (LASIX) 20 MG tablet Take 20 mg by mouth daily.  Marland Kitchen levothyroxine (SYNTHROID) 100 MCG tablet Take 100 mcg by mouth daily.  Marland Kitchen oxybutynin (DITROPAN-XL) 10 MG 24 hr tablet Take 10 mg by mouth daily.  Marland Kitchen tiotropium (SPIRIVA HANDIHALER) 18 MCG inhalation capsule Spiriva with HandiHaler 18 mcg and inhalation capsules  . TRADJENTA 5 MG TABS tablet Take 5 mg by mouth daily.    Allergies: Patient has no known allergies.  Social History   Tobacco Use  . Smoking status: Former Smoker    Packs/day: 0.50    Years: 37.00    Pack years: 18.50    Types: Cigarettes    Quit date: 03/16/2015    Years since quitting: 4.1  . Smokeless tobacco: Never Used  Substance Use Topics  . Alcohol use: Yes    Alcohol/week: 1.0 standard drinks    Types: 1 Cans of beer per week  . Drug use: No    Family History  Problem Relation Age of Onset  . Hypertension Mother   . Hyperlipidemia Mother   . Coronary artery disease Father 75       3-vessel CABG  . Breast cancer Cousin   . Heart attack Sister 109  . Heart attack Brother 50    Review of Systems: A 12-system review of systems was performed and was negative except as noted in the HPI.   --------------------------------------------------------------------------------------------------  Physical Exam: BP (!) 148/90 (BP Location: Right Arm, Patient Position: Sitting, Cuff Size: Large)   Pulse 74   Ht 5' 4.5" (1.638 m)   Wt 237 lb 6 oz (107.7 kg)   SpO2 98%   BMI 40.12 kg/m   General:  NAD. HEENT: No conjunctival pallor or scleral icterus. Facemask in place. Neck: Supple without lymphadenopathy, thyromegaly, JVD, or HJR. No carotid bruit. Lungs: Normal work of breathing. Clear to auscultation bilaterally without wheezes or crackles. Heart: Regular rate and rhythm with 1/6 holosystolic murmur.  No rubs or gallops. Abd: Bowel sounds present. Soft, NT/ND.  Unable to assess HSM due to body habitus. Ext: 1+ distal calf edema bilaterally without rash.  2+ radial and pedal pulses bilaterally. Neuro: CNIII-XII intact. Strength and fine-touch sensation intact in upper and lower extremities bilaterally. Psych: Normal mood and affect.  EKG:  Normal sinus rhythm with LVH and mild QT prolongation (QTc 481 ms).  Lab Results  Component Value Date   WBC 3.3 (L) 03/21/2017   HGB 14.3 03/21/2017   HCT 42.6 03/21/2017   MCV 91.5 03/21/2017   PLT 118 (L) 03/21/2017    Lab Results  Component Value Date   NA 134 (L) 03/21/2017   K 3.5 03/21/2017   CL 103 03/21/2017   CO2 24 03/21/2017   BUN 7 03/21/2017   CREATININE 0.78 03/21/2017   GLUCOSE 214 (H) 03/21/2017   ALT 67 (H) 03/21/2017    Lab Results  Component Value Date   CHOL 262 (H) 05/16/2015   HDL 51 05/16/2015   LDLCALC 184 (H) 05/16/2015   TRIG 135 05/16/2015   CHOLHDL 5.1 05/16/2015     --------------------------------------------------------------------------------------------------  ASSESSMENT AND PLAN: Chest pain: Jennifer Mosley reports two types of chest pain.  She has chronic GERD symptoms that have been controlled with esomeprazole until recently.  She also endorses exertional chest heaviness and  shortness of breath concerning for angina.  She has multiple cardiac risk factors, including, hypertension, hyperlipidemia, diabetes, family history, prior tobacco use,  and morbid obesity.  It is difficult to know if recent episode of severe chest pain was due to GERD, coronary insufficiency, or some other etiology.  Nonetheless, in light of her history of anginal symptoms and multiple risk factors, we must assume that this represents unstable angina.  We have discussed noninvasive and invasive evaluation options and have agreed to proceed directly to cardiac catheterization.  I have reviewed the risks, indications, and alternatives to cardiac catheterization, possible angioplasty, and stenting with the patient. Risks include but are not limited to bleeding, infection, vascular injury, stroke, myocardial infection, arrhythmia, kidney injury, radiation-related injury in the case of prolonged fluoroscopy use, emergency cardiac surgery, and death. The patient understands the risks of serious complication is 1-2 in [id number] with diagnostic cardiac cath and 1-2% or less with angioplasty/stenting.  I will initiate metoprolol tartrate 25 mg twice daily for antianginal and blood pressure therapy.  I have also provided Jennifer Mosley with a prescription for sublingual nitroglycerin and instructed her to call 911 for recurrent chest pain unresponsive to NTG.  Jennifer Mosley should continue esomeprazole and GI follow-up, as previously arranged.  Hypertension: Blood pressure mildly elevated today.  As above, we will add metoprolol tartrate 25 mg twice daily.  Hyperlipidemia: Given history of DM and multiple strokes, statin therapy is indicated.  We will defer initiation of this until after catheterization next week.  Morbid obesity: BMI > 40 with multiple comorbidities.  Weight loss encouraged through diet and exercise, though exercise regimen will need to be deferred until after completion of catheterization.  Nelva Bush, MD 04/28/2019 2:00 PM

## 2019-04-28 NOTE — Patient Instructions (Addendum)
Medication Instructions:  Your physician has recommended you make the following change in your medication:  START Metoprolol 25 mg twice daily. An Rx has been sent to your pharmacy  START sublingual Nitro-Glycerin use as directed. An Rx has been sent to your pharmacy.   Continue all other medications as prescribed  *If you need a refill on your cardiac medications before your next appointment, please call your pharmacy*   Lab Work: Bmet and Cbc today.  You will need a COVID test on 05/01/19. Please report to the [facility] drive up test site between 8:30am-1pm.  If you have labs (blood work) drawn today and your tests are completely normal, you will receive your results only by: Marland Kitchen MyChart Message (if you have MyChart) OR . A paper copy in the mail If you have any lab test that is abnormal or we need to change your treatment, we will call you to review the results.   Testing/Procedures: Your physician has requested that you have a cardiac catheterization. Cardiac catheterization is used to diagnose and/or treat various heart conditions. Doctors may recommend this procedure for a number of different reasons. The most common reason is to evaluate chest pain. Chest pain can be a symptom of coronary artery disease (CAD), and cardiac catheterization can show whether plaque is narrowing or blocking your heart's arteries. This procedure is also used to evaluate the valves, as well as measure the blood flow and oxygen levels in different parts of your heart. For further information please visit [email or web address]. Please follow instruction sheet, as given.     Follow-Up: At [facility], you and your health needs are our priority.  As part of our continuing mission to provide you with exceptional heart care, we have created designated Provider Care Teams.  These Care Teams include your primary Cardiologist (physician) and Advanced Practice Providers (APPs -  Physician Assistants and  Nurse Practitioners) who all work together to provide you with the care you need, when you need it.  We recommend signing up for the patient portal called "MyChart".  Sign up information is provided on this After Visit Summary.  MyChart is used to connect with patients for Virtual Visits (Telemedicine).  Patients are able to view lab/test results, encounter notes, upcoming appointments, etc.  Non-urgent messages can be sent to your provider as well.   To learn more about what you can do with MyChart, go to [email or web address].    Your next appointment:   3 week(s)  The format for your next appointment:   In Person  Provider:    You may see  Dr. Saunders Revel or one of the following Advanced Practice Providers on your designated Care Team:    Murray Hodgkins, NP  Christell Faith, PA-C  Marrianne Mood, PA-C    Other Instructions [facility] Cardiac Cath Instructions   You are scheduled for a Cardiac Cath on:05/02/19 @8 :30am  Please arrive at 7:30 am on the day of your procedure  Please expect a call from our [place] to pre-register you  Do not eat/drink anything after midnight  Someone will need to drive you home  It is recommended someone be with you for the first 24 hours after your procedure  Wear clothes that are easy to get on/off and wear slip on shoes if possible   Medications bring a current list of all medications with you  _X__ You may take all of your medications the morning of your procedure with enough water to swallow safely  Day of your procedure: Arrive at the [facility] entrance.  Free valet service is available.  After entering the [place] please check-in at the registration desk (1st desk on your right) to receive your armband. After receiving your armband someone will escort you to the cardiac cath/special procedures waiting area.  The usual length of stay after your procedure is about 2 to 3 hours.  This can vary.  If you have  any questions, please call our office at [phone], or you may call the cardiac cath lab at [facility] directly at [phone]    Nitroglycerin sublingual tablets What is this medicine? NITROGLYCERIN (nye troe GLI ser in) is a type of vasodilator. It relaxes blood vessels, increasing the blood and oxygen supply to your heart. This medicine is used to relieve chest pain caused by angina. It is also used to prevent chest pain before activities like climbing stairs, going outdoors in cold weather, or sexual activity. This medicine may be used for other purposes; ask your health care provider or pharmacist if you have questions. COMMON BRAND NAME(S): Nitroquick, Nitrostat, Nitrotab What should I tell my health care provider before I take this medicine? They need to know if you have any of these conditions:  anemia  head injury, recent stroke, or bleeding in the brain  liver disease  previous heart attack  an unusual or allergic reaction to nitroglycerin, other medicines, foods, dyes, or preservatives  pregnant or trying to get pregnant  breast-feeding How should I use this medicine? Take this medicine by mouth as needed. At the first sign of an angina attack (chest pain or tightness) place one tablet under your tongue. You can also take this medicine 5 to 10 minutes before an event likely to produce chest pain. Follow the directions on the prescription label. Let the tablet dissolve under the tongue. Do not swallow whole. Replace the dose if you accidentally swallow it. It will help if your mouth is not dry. Saliva around the tablet will help it to dissolve more quickly. Do not eat or drink, smoke or chew tobacco while a tablet is dissolving. If you are not better within 5 minutes after taking ONE dose of nitroglycerin, call 9-1-1 immediately to seek emergency medical care. Do not take more than 3 nitroglycerin tablets over 15 minutes. If you take this medicine often to relieve symptoms of  angina, your doctor or health care professional may provide you with different instructions to manage your symptoms. If symptoms do not go away after following these instructions, it is important to call 9-1-1 immediately. Do not take more than 3 nitroglycerin tablets over 15 minutes. Talk to your pediatrician regarding the use of this medicine in children. Special care may be needed. Overdosage: If you think you have taken too much of this medicine contact a poison control center or emergency room at once. NOTE: This medicine is only for you. Do not share this medicine with others. What if I miss a dose? This does not apply. This medicine is only used as needed. What may interact with this medicine? Do not take this medicine with any of the following medications:  certain migraine medicines like ergotamine and dihydroergotamine (DHE)  medicines used to treat erectile dysfunction like sildenafil, tadalafil, and vardenafil  riociguat This medicine may also interact with the following medications:  alteplase  aspirin  heparin  medicines for high blood pressure  medicines for mental depression  other medicines used to treat angina  phenothiazines like chlorpromazine, mesoridazine,  prochlorperazine, thioridazine This list may not describe all possible interactions. Give your health care provider a list of all the medicines, herbs, non-prescription drugs, or dietary supplements you use. Also tell them if you smoke, drink alcohol, or use illegal drugs. Some items may interact with your medicine. What should I watch for while using this medicine? Tell your doctor or health care professional if you feel your medicine is no longer working. Keep this medicine with you at all times. Sit or lie down when you take your medicine to prevent falling if you feel dizzy or faint after using it. Try to remain calm. This will help you to feel better faster. If you feel dizzy, take several deep breaths and  lie down with your feet propped up, or bend forward with your head resting between your knees. You may get drowsy or dizzy. Do not drive, use machinery, or do anything that needs mental alertness until you know how this drug affects you. Do not stand or sit up quickly, especially if you are an older patient. This reduces the risk of dizzy or fainting spells. Alcohol can make you more drowsy and dizzy. Avoid alcoholic drinks. Do not treat yourself for coughs, colds, or pain while you are taking this medicine without asking your doctor or health care professional for advice. Some ingredients may increase your blood pressure. What side effects may I notice from receiving this medicine? Side effects that you should report to your doctor or health care professional as soon as possible:  blurred vision  dry mouth  skin rash  sweating  the feeling of extreme pressure in the head  unusually weak or tired Side effects that usually do not require medical attention (report to your doctor or health care professional if they continue or are bothersome):  flushing of the face or neck  headache  irregular heartbeat, palpitations  nausea, vomiting This list may not describe all possible side effects. Call your doctor for medical advice about side effects. You may report side effects to FDA at 1-800-FDA-1088. Where should I keep my medicine? Keep out of the reach of children. Store at room temperature between 20 and 25 degrees C (68 and 77 degrees F). Store in [facility]. Protect from light and moisture. Keep tightly closed. Throw away any unused medicine after the expiration date. NOTE: This sheet is a summary. It may not cover all possible information. If you have questions about this medicine, talk to your doctor, pharmacist, or health care provider.  2020 Elsevier/Gold Standard (2012-10-27 17:57:36)

## 2019-04-28 NOTE — Progress Notes (Signed)
New Outpatient Visit Date: 04/28/2019  Referring Provider: [name], MD [name],   [id number]  Chief Complaint: Chest pain  HPI:  Jennifer Mosley is a 64 y.o. female who is being seen today for the evaluation of chest pain at the request of Dr. Vicente Males. She has a history of hypertension, hyperlipidemia, type 2 diabetes mellitus, stroke, COPD, and anxiety.  She was seen 2 days ago by Dr. Vicente Males for evaluation of chest and abdominal pain.  Given multiple cardiac risk factors, Dr. Vicente Males was concerned about possible cardiac etiology for her symptoms.  She had been evaluated by Dr. Mariea Clonts in our practice in 2016.  Pharmacologic myocardial perfusion stress test at that time was normal.  Subsequent echocardiogram in 2017 in the setting of stroke showed mild LVH with LVEF of 55-60% and grade 1 diastolic dysfunction.  Jennifer Mosley reports at least 1 year of chest discomfort when walking or performing yard work.  She describes it as a heaviness in the center of her chest and typically stops after resting a few minutes.  She also notes intermittent sharp pain in the center of her chest that occasionally radiates up to her neck and ear.  She is always associated this with acid reflux and previously had good results using Nexium.  However, she had a severe episode of sharp substernal chest pain that woke her up within the last few weeks.  She tried taking Tums with no relief.  It gradually resolved after few minutes after she drank warm milk.  In the past, similar pain has been present after eating.  She thought that her acid reflux was worsening, prompting referral to Dr. Vicente Males for further evaluation.  Jennifer Mosley reports stable exertional dyspnea.  She notes several strokes, most recently 3 to 4 years ago.  She reports some memory deficits but otherwise no residual issues related to her prior strokes.  She has never undergone heart catheterization.  She does not recall what she was  experiencing in 2016 when she underwent stress testing.  She has chronic lower extremity edema; she denies orthopnea and PND.  --------------------------------------------------------------------------------------------------  Cardiovascular History & Procedures: Cardiovascular Problems:  Chest pain  Stroke  Risk Factors:  Stroke, hypertension, hyperlipidemia, type 2 diabetes mellitus, morbid obesity, prior tobacco use, and family history  Cath/PCI:  None  CV Surgery:  None  EP Procedures and Devices:  None  Non-Invasive Evaluation(s):  Carotid Doppler (05/17/2015): Mild bilateral internal carotid artery stenoses (1-49%).  Patent vertebral arteries with antegrade flow.  TTE (05/16/2015): Normal LV size with mild LVH.  LVEF 55-60% with grade 1 diastolic dysfunction.  Normal RV size and function.  No significant valvular abnormality.  Pharmacologic MPI (07/05/2014): Low risk study without ischemia or scar.  LVEF 74%.  Recent CV Pertinent Labs: Lab Results  Component Value Date   CHOL 262 (H) 05/16/2015   HDL 51 05/16/2015   LDLCALC 184 (H) 05/16/2015   TRIG 135 05/16/2015   CHOLHDL 5.1 05/16/2015   K 3.5 03/21/2017   K 4.2 08/14/2013   BUN 7 03/21/2017   BUN 11 08/14/2013   CREATININE 0.78 03/21/2017   CREATININE 1.01 08/14/2013    --------------------------------------------------------------------------------------------------  Past Medical History:  Diagnosis Date  . Anxiety   . COPD (chronic obstructive pulmonary disease) ([place])   . Depression   . Essential hypertension   . Hyperlipidemia   . Hypertension   . Stroke ([place])   . 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 BILATERAL CARPAL TUNNEL RELEASE    . COLONOSCOPY    . COLONOSCOPY WITH PROPOFOL N/A 03/15/2017   Procedure: COLONOSCOPY WITH PROPOFOL;  Surgeon: [name], MD;  Location: [facility] ENDOSCOPY;  Service:  Gastroenterology;  Laterality: N/A;  . COLONOSCOPY WITH PROPOFOL N/A 03/29/2018   Procedure: COLONOSCOPY WITH PROPOFOL;  Surgeon: [name], MD;  Location: [facility] ENDOSCOPY;  Service: Gastroenterology;  Laterality: N/A;  . ECTOPIC PREGNANCY SURGERY      Current Meds  Medication Sig  . albuterol (PROAIR HFA) 108 (90 Base) MCG/ACT inhaler ProAir HFA 90 mcg/actuation aerosol inhaler  INHALE 2 PUFFS BY MOUTH EVERY 4 HOURS AS NEEDED WHEEZING OR SHORTNESS OF BREATH  . aspirin 81 MG tablet Take 81 mg by mouth daily.  . enalapril (VASOTEC) 10 MG tablet Take 1 tablet (10 mg total) by mouth daily. (Patient taking differently: Take 20 mg by mouth daily. )  . esomeprazole (NEXIUM) 40 MG capsule Take 40 mg by mouth daily at 12 noon.  Marland Kitchen FLOVENT HFA 220 MCG/ACT inhaler   . furosemide (LASIX) 20 MG tablet Take 20 mg by mouth daily.  Marland Kitchen levothyroxine (SYNTHROID) 100 MCG tablet Take 100 mcg by mouth daily.  Marland Kitchen oxybutynin (DITROPAN-XL) 10 MG 24 hr tablet Take 10 mg by mouth daily.  Marland Kitchen tiotropium (SPIRIVA HANDIHALER) 18 MCG inhalation capsule Spiriva with HandiHaler 18 mcg and inhalation capsules  . TRADJENTA 5 MG TABS tablet Take 5 mg by mouth daily.    Allergies: Patient has no known allergies.  Social History   Tobacco Use  . Smoking status: Former Smoker    Packs/day: 0.50    Years: 37.00    Pack years: 18.50    Types: Cigarettes    Quit date: 03/16/2015    Years since quitting: 4.1  . Smokeless tobacco: Never Used  Substance Use Topics  . Alcohol use: Yes    Alcohol/week: 1.0 standard drinks    Types: 1 Cans of beer per week  . Drug use: No    Family History  Problem Relation Age of Onset  . Hypertension Mother   . Hyperlipidemia Mother   . Coronary artery disease Father 54       3-vessel CABG  . Breast cancer Cousin   . Heart attack Sister 37  . Heart attack Brother 50    Review of Systems: A 12-system review of systems was performed and was negative except as noted in the  HPI.  --------------------------------------------------------------------------------------------------  Physical Exam: BP (!) 148/90 (BP Location: Right Arm, Patient Position: Sitting, Cuff Size: Large)   Pulse 74   Ht 5' 4.5" (1.638 m)   Wt 237 lb 6 oz (107.7 kg)   SpO2 98%   BMI 40.12 kg/m   General:  NAD. HEENT: No conjunctival pallor or scleral icterus. Facemask in place. Neck: Supple without lymphadenopathy, thyromegaly, JVD, or HJR. No carotid bruit. Lungs: Normal work of breathing. Clear to auscultation bilaterally without wheezes or crackles. Heart: Regular rate and rhythm with 1/6 holosystolic murmur.  No rubs or gallops. Abd: Bowel sounds present. Soft, NT/ND.  Unable to assess HSM due to body habitus. Ext: 1+ distal calf edema bilaterally without rash.  2+ radial and pedal pulses bilaterally. Neuro: CNIII-XII intact. Strength and fine-touch sensation intact in upper and lower extremities bilaterally. Psych: Normal mood and affect.  EKG:  Normal sinus rhythm with LVH and mild QT prolongation (QTc 481 ms).  Lab Results  Component Value Date   WBC 3.3 (L) 03/21/2017   HGB 14.3 03/21/2017   HCT 42.6 03/21/2017   MCV 91.5 03/21/2017   PLT 118 (L) 03/21/2017    Lab Results  Component Value Date   NA 134 (L) 03/21/2017   K 3.5 03/21/2017   CL 103 03/21/2017   CO2 24 03/21/2017   BUN 7 03/21/2017   CREATININE 0.78 03/21/2017   GLUCOSE 214 (H) 03/21/2017   ALT 67 (H) 03/21/2017    Lab Results  Component Value Date   CHOL 262 (H) 05/16/2015   HDL 51 05/16/2015   LDLCALC 184 (H) 05/16/2015   TRIG 135 05/16/2015   CHOLHDL 5.1 05/16/2015     --------------------------------------------------------------------------------------------------  ASSESSMENT AND PLAN: Chest pain: Jennifer Mosley reports two types of chest pain.  She has chronic GERD symptoms that have been controlled with esomeprazole until recently.  She also endorses exertional chest heaviness and  shortness of breath concerning for angina.  She has multiple cardiac risk factors, including, hypertension, hyperlipidemia, diabetes, family history, prior tobacco use,  and morbid obesity.  It is difficult to know if recent episode of severe chest pain was due to GERD, coronary insufficiency, or some other etiology.  Nonetheless, in light of her history of anginal symptoms and multiple risk factors, we must assume that this represents unstable angina.  We have discussed noninvasive and invasive evaluation options and have agreed to proceed directly to cardiac catheterization.  I have reviewed the risks, indications, and alternatives to cardiac catheterization, possible angioplasty, and stenting with the patient. Risks include but are not limited to bleeding, infection, vascular injury, stroke, myocardial infection, arrhythmia, kidney injury, radiation-related injury in the case of prolonged fluoroscopy use, emergency cardiac surgery, and death. The patient understands the risks of serious complication is 1-2 in [id number] with diagnostic cardiac cath and 1-2% or less with angioplasty/stenting.  I will initiate metoprolol tartrate 25 mg twice daily for antianginal and blood pressure therapy.  I have also provided Jennifer Mosley with a prescription for sublingual nitroglycerin and instructed her to call 911 for recurrent chest pain unresponsive to NTG.  Jennifer Mosley should continue esomeprazole and GI follow-up, as previously arranged.  Hypertension: Blood pressure mildly elevated today.  As above, we will add metoprolol tartrate 25 mg twice daily.  Hyperlipidemia: Given history of DM and multiple strokes, statin therapy is indicated.  We will defer initiation of this until after catheterization next week.  Morbid obesity: BMI > 40 with multiple comorbidities.  Weight loss encouraged through diet and exercise, though exercise regimen will need to be deferred until after completion of catheterization.  Nelva Bush, MD 04/28/2019 2:00 PM

## 2019-04-29 ENCOUNTER — Encounter: Payer: Self-pay | Admitting: Internal Medicine

## 2019-04-29 DIAGNOSIS — R079 Chest pain, unspecified: Secondary | ICD-10-CM | POA: Insufficient documentation

## 2019-04-29 LAB — CBC WITH DIFFERENTIAL/PLATELET
Basophils Absolute: 0.1 10*3/uL (ref 0.0–0.2)
Basos: 1 %
EOS (ABSOLUTE): 0.4 10*3/uL (ref 0.0–0.4)
Eos: 9 %
Hematocrit: 42.2 % (ref 34.0–46.6)
Hemoglobin: 14.6 g/dL (ref 11.1–15.9)
Immature Grans (Abs): 0 10*3/uL (ref 0.0–0.1)
Immature Granulocytes: 0 %
Lymphocytes Absolute: 1.6 10*3/uL (ref 0.7–3.1)
Lymphs: 37 %
MCH: 31.3 pg (ref 26.6–33.0)
MCHC: 34.6 g/dL (ref 31.5–35.7)
MCV: 91 fL (ref 79–97)
Monocytes Absolute: 0.3 10*3/uL (ref 0.1–0.9)
Monocytes: 7 %
Neutrophils Absolute: 2 10*3/uL (ref 1.4–7.0)
Neutrophils: 46 %
Platelets: 117 10*3/uL — ABNORMAL LOW (ref 150–450)
RBC: 4.66 x10E6/uL (ref 3.77–5.28)
RDW: 12.5 % (ref 11.7–15.4)
WBC: 4.3 10*3/uL (ref 3.4–10.8)

## 2019-04-29 LAB — BASIC METABOLIC PANEL
BUN/Creatinine Ratio: 12 (ref 12–28)
BUN: 9 mg/dL (ref 8–27)
CO2: 25 mmol/L (ref 20–29)
Calcium: 9.3 mg/dL (ref 8.7–10.3)
Chloride: 103 mmol/L (ref 96–106)
Creatinine, Ser: 0.74 mg/dL (ref 0.57–1.00)
GFR calc Af Amer: 100 mL/min/{1.73_m2} (ref >59)
GFR calc non Af Amer: 87 mL/min/{1.73_m2} (ref >59)
Glucose: 211 mg/dL — ABNORMAL HIGH (ref 65–99)
Potassium: 4.6 mmol/L (ref 3.5–5.2)
Sodium: 141 mmol/L (ref 134–144)

## 2019-04-30 LAB — ANA: Anti Nuclear Antibody (ANA): NEGATIVE

## 2019-04-30 LAB — IMMUNOGLOBULINS A/E/G/M, SERUM
IgA/Immunoglobulin A, Serum: 1201 mg/dL — ABNORMAL HIGH (ref 87–352)
IgE (Immunoglobulin E), Serum: 334 IU/mL (ref 6–495)
IgG (Immunoglobin G), Serum: 1535 mg/dL (ref 586–1602)
IgM (Immunoglobulin M), Srm: 29 mg/dL (ref 26–217)

## 2019-04-30 LAB — IRON,TIBC AND FERRITIN PANEL
Ferritin: 80 ng/mL (ref 15–150)
Iron Saturation: 42 % (ref 15–55)
Iron: 104 ug/dL (ref 27–139)
Total Iron Binding Capacity: 248 ug/dL — ABNORMAL LOW (ref 250–450)
UIBC: 144 ug/dL (ref 118–369)

## 2019-04-30 LAB — HEPATITIS B SURFACE ANTIBODY,QUALITATIVE: Hep B Surface Ab, Qual: NONREACTIVE

## 2019-04-30 LAB — ANTI-MICROSOMAL ANTIBODY LIVER / KIDNEY: LKM1 Ab: 1 Units (ref 0.0–20.0)

## 2019-04-30 LAB — MITOCHONDRIAL/SMOOTH MUSCLE AB PNL
Mitochondrial Ab: <20 Units (ref 0.0–20.0)
Smooth Muscle Ab: 20 Units — ABNORMAL HIGH (ref 0–19)

## 2019-04-30 LAB — HEPATITIS B SURFACE ANTIGEN: Hepatitis B Surface Ag: NEGATIVE

## 2019-04-30 LAB — CERULOPLASMIN: Ceruloplasmin: 25.9 mg/dL (ref 19.0–39.0)

## 2019-04-30 LAB — HEPATITIS B E ANTIBODY: Hep B E Ab: NEGATIVE

## 2019-04-30 LAB — CK: Total CK: 69 U/L (ref 32–182)

## 2019-04-30 LAB — HEPATITIS C ANTIBODY: Hep C Virus Ab: <0.1 s/co ratio (ref 0.0–0.9)

## 2019-04-30 LAB — CELIAC DISEASE AB SCREEN W/RFX
Antigliadin Abs, IgA: 21 units — ABNORMAL HIGH (ref 0–19)
Transglutaminase IgA: <2 U/mL (ref 0–3)

## 2019-04-30 LAB — ALPHA-1-ANTITRYPSIN: A-1 Antitrypsin: 148 mg/dL (ref 101–187)

## 2019-04-30 LAB — HEPATITIS B E ANTIGEN: Hep B E Ag: NEGATIVE

## 2019-04-30 LAB — HEPATITIS A ANTIBODY, TOTAL: hep A Total Ab: POSITIVE — AB

## 2019-04-30 LAB — HEPATITIS B CORE ANTIBODY, TOTAL: Hep B Core Total Ab: NEGATIVE

## 2019-05-01 ENCOUNTER — Other Ambulatory Visit: Payer: Self-pay

## 2019-05-01 ENCOUNTER — Other Ambulatory Visit
Admission: RE | Admit: 2019-05-01 | Discharge: 2019-05-01 | Disposition: A | Discharge status: Home / Self Care | Payer: Medicare Other | Source: Ambulatory Visit | Attending: Internal Medicine | Admitting: Internal Medicine

## 2019-05-01 DIAGNOSIS — Z20822 Contact with and (suspected) exposure to covid-19: Secondary | ICD-10-CM | POA: Insufficient documentation

## 2019-05-01 DIAGNOSIS — Z01812 Encounter for preprocedural laboratory examination: Secondary | ICD-10-CM | POA: Insufficient documentation

## 2019-05-02 ENCOUNTER — Ambulatory Visit
Admission: RE | Admit: 2019-05-02 | Discharge: 2019-05-02 | Disposition: A | Discharge status: Home / Self Care | Payer: Medicare Other | Attending: Internal Medicine | Admitting: Internal Medicine

## 2019-05-02 ENCOUNTER — Encounter
Admission: RE | Disposition: A | Discharge status: Home / Self Care | Payer: Medicare Other | Source: Home / Self Care | Attending: Internal Medicine

## 2019-05-02 ENCOUNTER — Other Ambulatory Visit: Payer: Self-pay

## 2019-05-02 ENCOUNTER — Encounter: Payer: Self-pay | Admitting: Internal Medicine

## 2019-05-02 DIAGNOSIS — Z7982 Long term (current) use of aspirin: Secondary | ICD-10-CM | POA: Diagnosis not present

## 2019-05-02 DIAGNOSIS — K219 Gastro-esophageal reflux disease without esophagitis: Secondary | ICD-10-CM | POA: Diagnosis not present

## 2019-05-02 DIAGNOSIS — Z79890 Hormone replacement therapy (postmenopausal): Secondary | ICD-10-CM | POA: Diagnosis not present

## 2019-05-02 DIAGNOSIS — I25110 Atherosclerotic heart disease of native coronary artery with unstable angina pectoris: Secondary | ICD-10-CM | POA: Diagnosis present

## 2019-05-02 DIAGNOSIS — E11610 Type 2 diabetes mellitus with diabetic neuropathic arthropathy: Secondary | ICD-10-CM | POA: Diagnosis not present

## 2019-05-02 DIAGNOSIS — Z6841 Body Mass Index (BMI) 40.0 and over, adult: Secondary | ICD-10-CM | POA: Insufficient documentation

## 2019-05-02 DIAGNOSIS — Z79899 Other long term (current) drug therapy: Secondary | ICD-10-CM | POA: Insufficient documentation

## 2019-05-02 DIAGNOSIS — E785 Hyperlipidemia, unspecified: Secondary | ICD-10-CM | POA: Diagnosis not present

## 2019-05-02 DIAGNOSIS — I10 Essential (primary) hypertension: Secondary | ICD-10-CM | POA: Insufficient documentation

## 2019-05-02 DIAGNOSIS — I208 Other forms of angina pectoris: Secondary | ICD-10-CM

## 2019-05-02 DIAGNOSIS — I6523 Occlusion and stenosis of bilateral carotid arteries: Secondary | ICD-10-CM | POA: Diagnosis not present

## 2019-05-02 DIAGNOSIS — F329 Major depressive disorder, single episode, unspecified: Secondary | ICD-10-CM | POA: Diagnosis not present

## 2019-05-02 DIAGNOSIS — Z7951 Long term (current) use of inhaled steroids: Secondary | ICD-10-CM | POA: Diagnosis not present

## 2019-05-02 DIAGNOSIS — F419 Anxiety disorder, unspecified: Secondary | ICD-10-CM | POA: Diagnosis not present

## 2019-05-02 DIAGNOSIS — R079 Chest pain, unspecified: Secondary | ICD-10-CM

## 2019-05-02 DIAGNOSIS — Z8673 Personal history of transient ischemic attack (TIA), and cerebral infarction without residual deficits: Secondary | ICD-10-CM | POA: Insufficient documentation

## 2019-05-02 DIAGNOSIS — J449 Chronic obstructive pulmonary disease, unspecified: Secondary | ICD-10-CM | POA: Insufficient documentation

## 2019-05-02 DIAGNOSIS — Z7984 Long term (current) use of oral hypoglycemic drugs: Secondary | ICD-10-CM | POA: Diagnosis not present

## 2019-05-02 DIAGNOSIS — Z87891 Personal history of nicotine dependence: Secondary | ICD-10-CM | POA: Insufficient documentation

## 2019-05-02 DIAGNOSIS — I200 Unstable angina: Secondary | ICD-10-CM | POA: Diagnosis present

## 2019-05-02 HISTORY — PX: LEFT HEART CATH AND CORONARY ANGIOGRAPHY: CATH118249

## 2019-05-02 LAB — HEPATIC FUNCTION PANEL
ALT: 34 U/L (ref 0–44)
AST: 54 U/L — ABNORMAL HIGH (ref 15–41)
Albumin: 2.7 g/dL — ABNORMAL LOW (ref 3.5–5.0)
Alkaline Phosphatase: 191 U/L — ABNORMAL HIGH (ref 38–126)
Bilirubin, Direct: 0.1 mg/dL (ref 0.0–0.2)
Indirect Bilirubin: 0.8 mg/dL (ref 0.3–0.9)
Total Bilirubin: 0.9 mg/dL (ref 0.3–1.2)
Total Protein: 6.3 g/dL — ABNORMAL LOW (ref 6.5–8.1)

## 2019-05-02 LAB — LIPID PANEL
Cholesterol: 208 mg/dL — ABNORMAL HIGH (ref 0–200)
HDL: 48 mg/dL (ref >40)
LDL Cholesterol: 144 mg/dL — ABNORMAL HIGH (ref 0–99)
Total CHOL/HDL Ratio: 4.3 RATIO
Triglycerides: 81 mg/dL (ref <150)
VLDL: 16 mg/dL (ref 0–40)

## 2019-05-02 LAB — SARS CORONAVIRUS 2 (TAT 6-24 HRS): SARS Coronavirus 2: NEGATIVE

## 2019-05-02 LAB — GLUCOSE, CAPILLARY: Glucose-Capillary: 115 mg/dL — ABNORMAL HIGH (ref 70–99)

## 2019-05-02 SURGERY — LEFT HEART CATH AND CORONARY ANGIOGRAPHY
Anesthesia: Moderate Sedation | Laterality: Left

## 2019-05-02 MED ORDER — SODIUM CHLORIDE 0.9 % IV SOLN: 250.0000 mL | INTRAVENOUS | Status: DC | PRN

## 2019-05-02 MED ORDER — SODIUM CHLORIDE 0.9% FLUSH: 3.0000 mL | Freq: Two times a day (BID) | INTRAVENOUS | Status: DC

## 2019-05-02 MED ORDER — HEPARIN (PORCINE) IN NACL 1000-0.9 UT/500ML-% IV SOLN
INTRAVENOUS | Status: DC | PRN
  Administered 2019-05-02: 500 mL

## 2019-05-02 MED ORDER — SODIUM CHLORIDE 0.9 % IV SOLN: INTRAVENOUS | Status: DC

## 2019-05-02 MED ORDER — FENTANYL CITRATE (PF) 100 MCG/2ML IJ SOLN
INTRAMUSCULAR | Status: AC
  Filled 2019-05-02: qty 2

## 2019-05-02 MED ORDER — HYDRALAZINE HCL 20 MG/ML IJ SOLN: 10.0000 mg | INTRAMUSCULAR | Status: DC | PRN

## 2019-05-02 MED ORDER — VERAPAMIL HCL 2.5 MG/ML IV SOLN
INTRAVENOUS | Status: DC | PRN
  Administered 2019-05-02: 2.5 mg via INTRA_ARTERIAL

## 2019-05-02 MED ORDER — ACETAMINOPHEN 325 MG PO TABS: 650.0000 mg | ORAL_TABLET | ORAL | Status: DC | PRN

## 2019-05-02 MED ORDER — MIDAZOLAM HCL 2 MG/2ML IJ SOLN
INTRAMUSCULAR | Status: AC
  Filled 2019-05-02: qty 2

## 2019-05-02 MED ORDER — ASPIRIN 81 MG PO CHEW: 81.0000 mg | CHEWABLE_TABLET | ORAL | Status: AC

## 2019-05-02 MED ORDER — LABETALOL HCL 5 MG/ML IV SOLN: 10.0000 mg | INTRAVENOUS | Status: DC | PRN

## 2019-05-02 MED ORDER — SODIUM CHLORIDE 0.9% FLUSH: 3.0000 mL | INTRAVENOUS | Status: DC | PRN

## 2019-05-02 MED ORDER — HEPARIN SODIUM (PORCINE) 1000 UNIT/ML IJ SOLN
INTRAMUSCULAR | Status: AC
  Filled 2019-05-02: qty 1

## 2019-05-02 MED ORDER — FENTANYL CITRATE (PF) 100 MCG/2ML IJ SOLN
INTRAMUSCULAR | Status: DC | PRN
  Administered 2019-05-02: 50 ug via INTRAVENOUS

## 2019-05-02 MED ORDER — ONDANSETRON HCL 4 MG/2ML IJ SOLN: 4.0000 mg | Freq: Four times a day (QID) | INTRAMUSCULAR | Status: DC | PRN

## 2019-05-02 MED ORDER — IOHEXOL 300 MG/ML  SOLN
INTRAMUSCULAR | Status: DC | PRN
  Administered 2019-05-02: 50 mL

## 2019-05-02 MED ORDER — MIDAZOLAM HCL 2 MG/2ML IJ SOLN
INTRAMUSCULAR | Status: DC | PRN
  Administered 2019-05-02 (×2): 1 mg via INTRAVENOUS

## 2019-05-02 MED ORDER — HEPARIN (PORCINE) IN NACL 1000-0.9 UT/500ML-% IV SOLN
INTRAVENOUS | Status: AC
  Filled 2019-05-02: qty 1000

## 2019-05-02 MED ORDER — ASPIRIN 81 MG PO CHEW
CHEWABLE_TABLET | ORAL | Status: AC
  Administered 2019-05-02: 08:00:00 81 mg via ORAL
  Filled 2019-05-02: qty 1

## 2019-05-02 MED ORDER — SODIUM CHLORIDE 0.9 % WEIGHT BASED INFUSION
3.0000 mL/kg/h | INTRAVENOUS | Status: AC
  Administered 2019-05-02: 3 mL/kg/h via INTRAVENOUS

## 2019-05-02 MED ORDER — VERAPAMIL HCL 2.5 MG/ML IV SOLN
INTRAVENOUS | Status: AC
  Filled 2019-05-02: qty 2

## 2019-05-02 MED ORDER — SODIUM CHLORIDE 0.9 % WEIGHT BASED INFUSION: 1.0000 mL/kg/h | INTRAVENOUS | Status: DC

## 2019-05-02 MED ORDER — HEPARIN SODIUM (PORCINE) 1000 UNIT/ML IJ SOLN
INTRAMUSCULAR | Status: DC | PRN
  Administered 2019-05-02: 5000 [IU] via INTRAVENOUS

## 2019-05-02 SURGICAL SUPPLY — 8 items
CATH 5F 110X4 TIG (CATHETERS) ×3 IMPLANT
CATH INFINITI 5FR ANG PIGTAIL (CATHETERS) ×3 IMPLANT
DEVICE RAD TR BAND REGULAR (VASCULAR PRODUCTS) ×3 IMPLANT
GLIDESHEATH SLEND SS 6F .021 (SHEATH) ×3 IMPLANT
GUIDEWIRE INQWIRE 1.5J.035X260 (WIRE) ×1 IMPLANT
INQWIRE 1.5J .035X260CM (WIRE) ×3
KIT MANI 3VAL PERCEP (MISCELLANEOUS) ×3 IMPLANT
PACK CARDIAC CATH (CUSTOM PROCEDURE TRAY) ×3 IMPLANT

## 2019-05-02 NOTE — Brief Op Note (Signed)
BRIEF CARDIAC CATHETERIZATION NOTE  05/02/2019  9:41 AM  PATIENT:  Jennifer Mosley  64 y.o. female  PRE-OPERATIVE DIAGNOSIS: Unstable angina  POST-OPERATIVE DIAGNOSIS:  Nonobstructive CAD  PROCEDURE:  Procedure(s): LEFT HEART CATH AND CORONARY ANGIOGRAPHY (Left)  SURGEON:  Surgeon(s) and Role:    * Curtis Cain, MD - Primary  FINDINGS: 1. Mild to moderate, non-obstructive CAD, including 30% distal LMCA and proximal LAD stenoses, as well as 50% ostial and proximal LCx lesions flanking high OM1. 2. Normal LVEF with mildly elevated LVEDP.  RECOMMENDATIONS: 1. Continue medical therapy.  Proceed with GI evaluation.  Nelva Bush, MD [facility]

## 2019-05-02 NOTE — Interval H&P Note (Signed)
History and Physical Interval Note:  05/02/2019 9:09 AM  Jennifer Mosley  has presented today for surgery, with the diagnosis of unstable angina  The various methods of treatment have been discussed with the patient and family. After consideration of risks, benefits and other options for treatment, the patient has consented to  Procedure(s): LEFT HEART CATH AND CORONARY ANGIOGRAPHY (Left) as a surgical intervention.  The patient's history has been reviewed, patient examined, no change in status, stable for surgery.  I have reviewed the patient's chart and labs.  Questions were answered to the patient's satisfaction.    Cath Lab Visit (complete for each Cath Lab visit)  Clinical Evaluation Leading to the Procedure:   ACS: No.  Non-ACS:    Anginal Classification: CCS IV  Anti-ischemic medical therapy: Minimal Therapy (1 class of medications)  Non-Invasive Test Results: No non-invasive testing performed  Prior CABG: No previous CABG  Jennifer Mosley

## 2019-05-03 ENCOUNTER — Ambulatory Visit
Admission: RE | Admit: 2019-05-03 | Discharge: 2019-05-03 | Disposition: A | Discharge status: Home / Self Care | Payer: Medicare Other | Source: Ambulatory Visit | Attending: Gastroenterology | Admitting: Gastroenterology

## 2019-05-03 ENCOUNTER — Encounter: Payer: Self-pay | Admitting: Cardiology

## 2019-05-03 DIAGNOSIS — K7469 Other cirrhosis of liver: Secondary | ICD-10-CM | POA: Insufficient documentation

## 2019-05-04 ENCOUNTER — Encounter: Payer: Self-pay | Admitting: Gastroenterology

## 2019-05-05 ENCOUNTER — Telehealth: Payer: Self-pay

## 2019-05-05 NOTE — Telephone Encounter (Signed)
-----   Message from [name], MD sent at 05/04/2019  8:11 AM EDT ----- Inform very likely has cirrhosis based on the scan - suggest EGD to screen for varices- if says ok schedule

## 2019-05-05 NOTE — Telephone Encounter (Signed)
Called pt to inform her of U/S result and to schedule her upper and lower endoscopy procedure.  Unable to contact, LVM to return call

## 2019-05-08 NOTE — Telephone Encounter (Signed)
Patient left a voicemail returning your call.  °

## 2019-05-08 NOTE — Telephone Encounter (Signed)
Pt left VM returning my call. Called pt back but was unable to contact. I LVM to return call.

## 2019-05-10 ENCOUNTER — Other Ambulatory Visit: Payer: Self-pay

## 2019-05-10 DIAGNOSIS — K7469 Other cirrhosis of liver: Secondary | ICD-10-CM

## 2019-05-10 DIAGNOSIS — Z86010 Personal history of colonic polyps: Secondary | ICD-10-CM

## 2019-05-10 MED ORDER — NA SULFATE-K SULFATE-MG SULF 17.5-3.13-1.6 GM/177ML PO SOLN: 1.0000 | Freq: Once | ORAL | 0 refills | Status: AC

## 2019-05-11 ENCOUNTER — Ambulatory Visit
Admission: RE | Admit: 2019-05-11 | Discharge: 2019-05-11 | Disposition: A | Discharge status: Home / Self Care | Payer: Medicare Other | Source: Ambulatory Visit | Attending: Gastroenterology | Admitting: Gastroenterology

## 2019-05-11 ENCOUNTER — Other Ambulatory Visit: Payer: Self-pay

## 2019-05-11 DIAGNOSIS — R933 Abnormal findings on diagnostic imaging of other parts of digestive tract: Secondary | ICD-10-CM | POA: Insufficient documentation

## 2019-05-11 MED ORDER — GADOBUTROL 1 MMOL/ML IV SOLN
10.0000 mL | Freq: Once | INTRAVENOUS | Status: AC | PRN
  Administered 2019-05-11: 10 mL via INTRAVENOUS

## 2019-05-11 NOTE — Telephone Encounter (Signed)
Spoke with pt and informed her of U/S result and Dr. Georgeann Oppenheim recommendation. We were able to schedule the endoscopy procedures.

## 2019-05-12 ENCOUNTER — Telehealth: Payer: Self-pay

## 2019-05-12 DIAGNOSIS — R93429 Abnormal radiologic findings on diagnostic imaging of unspecified kidney: Secondary | ICD-10-CM

## 2019-05-12 NOTE — Telephone Encounter (Signed)
Called pt to inform her of MRI result and Dr. Georgeann Oppenheim recommendations.  Unable to contact, LVM to return call

## 2019-05-12 NOTE — Progress Notes (Deleted)
Cardiology Office Note    Date:  05/12/2019   ID:  Jennifer Mosley, DOB July 13, 1955, MRN [id number]  PCP:  [name], MD  Cardiologist:  Nelva Bush, MD  Electrophysiologist:  None   Chief Complaint: Follow-up diagnostic cath  History of Present Illness:   Jennifer Mosley is a 64 y.o. 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  She was previously evaluated by Dr. Fletcher Anon in 2016 with Lexiscan MPI at that time being normal.  Subsequent echo in 2017 in the setting of CVA showed an EF of 55 to 123456, grade 1 diastolic dysfunction, and mild LVH.  Following this, she was lost to follow-up.  She was evaluated by Dr. Saunders Revel on 04/28/2018 at the request of Dr. Vicente Males for chest pain.  She had recently been evaluated by GI several days prior and noted chest and abdominal pain.  She reported a 1 year history of chest discomfort when walking or performing yard work that was described as a heaviness in the center of her chest and typically stopped after resting for a few minutes.  She had associated this with acid reflux and noted improvement with Nexium or drinking warm milk.  She noted stable exertional dyspnea.  Given her multiple cardiac risk factors she was referred to cardiology and underwent diagnostic LHC on 05/02/2019 which showed mild to moderate, nonobstructive CAD, including 30% distal left main and proximal LAD stenoses as well as 50% ostial and proximal LCx lesions flanking high OM1.  Normal LV contraction with mildly elevated filling pressure consistent with diastolic dysfunction.  Continued medical management to prevent disease progression was recommended along with the patient to proceed with ongoing GI evaluation.  ***   Labs independently reviewed: 04/2019 - TC 208, TG 81, HDL 48, LDL 144, albumin 2.7, AST 54, ALT  normal, Hgb 14.6, PLT 117, BUN 9, serum creatinine 0.74, potassium 4.7  Past Medical History:  Diagnosis Date  . Anxiety   . COPD (chronic obstructive pulmonary disease) ([place])   . Depression   . Essential hypertension   . Hyperlipidemia   . Hypertension   . Stroke ([place])   . 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 BILATERAL CARPAL TUNNEL RELEASE    . COLONOSCOPY    . COLONOSCOPY WITH PROPOFOL N/A 03/15/2017   Procedure: COLONOSCOPY WITH PROPOFOL;  Surgeon: [name], MD;  Location: [facility] ENDOSCOPY;  Service: Gastroenterology;  Laterality: N/A;  . COLONOSCOPY WITH PROPOFOL N/A 03/29/2018   Procedure: COLONOSCOPY WITH PROPOFOL;  Surgeon: [name], MD;  Location: [facility] ENDOSCOPY;  Service: Gastroenterology;  Laterality: N/A;  . ECTOPIC PREGNANCY SURGERY    . LEFT HEART CATH AND CORONARY ANGIOGRAPHY Left 05/02/2019   Procedure: LEFT HEART CATH AND CORONARY ANGIOGRAPHY;  Surgeon: Nelva Bush, MD;  Location: [place] CV LAB;  Service: Cardiovascular;  Laterality: Left;    Current Medications: No outpatient medications have been marked as taking for the 05/19/19 encounter (Appointment) with Rise Mu, PA-C.    Allergies:   Patient has no known allergies.   Social History   Socioeconomic History  . Marital status: Widowed    Spouse name: Not on file  . Number of children: Not on file  . Years of education: Not on file  .  Highest education level: Not on file  Occupational History  . Not on file  Tobacco Use  . Smoking status: Former Smoker    Packs/day: 0.50    Years: 37.00    Pack years: 18.50    Types: Cigarettes    Quit date: 03/16/2015    Years since quitting: 4.1  . Smokeless tobacco: Never Used  Substance and Sexual Activity  . Alcohol use: Yes    Alcohol/week: 1.0 standard drinks    Types: 1 Cans of beer per week  . Drug use: No  . Sexual activity: Not on file  Other Topics  Concern  . Not on file  Social History Narrative  . Not on file   Social Determinants of Health   Financial Resource Strain:   . Difficulty of Paying Living Expenses:   Food Insecurity:   . Worried About Charity fundraiser in the Last Year:   . Arboriculturist in the Last Year:   Transportation Needs:   . Film/video editor (Medical):   Marland Kitchen Lack of Transportation (Non-Medical):   Physical Activity:   . Days of Exercise per Week:   . Minutes of Exercise per Session:   Stress:   . Feeling of Stress :   Social Connections:   . Frequency of Communication with Friends and Family:   . Frequency of Social Gatherings with Friends and Family:   . Attends Religious Services:   . Active Member of Clubs or Organizations:   . Attends Archivist Meetings:   Marland Kitchen Marital Status:      Family History:  The patient's family history includes Breast cancer in her cousin; Coronary artery disease (age of onset: 85) in her father; Heart attack (age of onset: 32) in her sister; Heart attack (age of onset: 72) in her brother; Hyperlipidemia in her mother; Hypertension in her mother.  ROS:   ROS   EKGs/Labs/Other Studies Reviewed:    Studies reviewed were summarized above. The additional studies were reviewed today:  LHC 05/02/2019: Conclusions: 1. Mild to moderate, non-obstructive coronary artery disease, including 30% distal LMCA and proximal LAD stenoses, as well as 50% ostial and proximal LCx lesions flanking high OM1. 2. Normal left ventricular contraction with mildly elevated filling pressure consistent with diastolic dysfunction.  Recommendations: 1. Continue medical therapy to prevent progression of disease.  We will check a lipid panel and LFTs with intention of adding statin therapy if possible in the setting of underlying liver disease. 2. Proceed with GI evaluation. __________  2D echo 05/16/2015: - Left ventricle: The cavity size was normal. There was mild  concentric  hypertrophy. Systolic function was normal. The  estimated ejection fraction was in the range of 55% to 60%. Wall  motion was normal; there were no regional wall motion  abnormalities. Doppler parameters are consistent with abnormal  left ventricular relaxation (grade 1 diastolic dysfunction).   Impressions:   - No cardiac source of emboli was indentified.   EKG:  EKG is ordered today.  The EKG ordered today demonstrates ***  Recent Labs: 04/28/2019: BUN 9; Creatinine, Ser 0.74; Hemoglobin 14.6; Platelets 117; Potassium 4.6; Sodium 141 05/02/2019: ALT 34  Recent Lipid Panel    Component Value Date/Time   CHOL 208 (H) 05/02/2019 0952   TRIG 81 05/02/2019 0952   HDL 48 05/02/2019 0952   CHOLHDL 4.3 05/02/2019 0952   VLDL 16 05/02/2019 0952   LDLCALC 144 (H) 05/02/2019 0952    PHYSICAL EXAM:  VS:  There were no vitals taken for this visit.  BMI: There is no height or weight on file to calculate BMI.  Physical Exam  Wt Readings from Last 3 Encounters:  05/02/19 237 lb 7 oz (107.7 kg)  04/28/19 237 lb 6 oz (107.7 kg)  04/26/19 235 lb 9.6 oz (106.9 kg)     ASSESSMENT & PLAN:   1. ***  Disposition: F/u with Dr. Saunders Revel or an APP in ***.   Medication Adjustments/Labs and Tests Ordered: Current medicines are reviewed at length with the patient today.  Concerns regarding medicines are outlined above. Medication changes, Labs and Tests ordered today are summarized above and listed in the Patient Instructions accessible in Encounters.   SignedChristell Faith, PA-C 05/12/2019 11:09 AM     [facility] [address] [place] [address] [place], [place] [address] [phone]

## 2019-05-12 NOTE — Telephone Encounter (Signed)
-----   Message from [name], MD sent at 05/11/2019 12:53 PM EDT ----- Inform that the results of complex cyst in kidney.  Will need to be referred to urology.  Referred to urology and send a copy to the primary care physician please  Dr [name] MD,MRCP [facility]) Gastroenterology/Hepatology Pager: [phone]

## 2019-05-14 ENCOUNTER — Encounter: Payer: Self-pay | Admitting: Gastroenterology

## 2019-05-16 ENCOUNTER — Telehealth (INDEPENDENT_AMBULATORY_CARE_PROVIDER_SITE_OTHER): Payer: Medicare Other | Admitting: Gastroenterology

## 2019-05-16 DIAGNOSIS — I208 Other forms of angina pectoris: Secondary | ICD-10-CM | POA: Diagnosis not present

## 2019-05-16 DIAGNOSIS — K7469 Other cirrhosis of liver: Secondary | ICD-10-CM | POA: Diagnosis not present

## 2019-05-16 DIAGNOSIS — Z23 Encounter for immunization: Secondary | ICD-10-CM | POA: Diagnosis not present

## 2019-05-16 DIAGNOSIS — R93429 Abnormal radiologic findings on diagnostic imaging of unspecified kidney: Secondary | ICD-10-CM

## 2019-05-16 NOTE — Telephone Encounter (Signed)
Spoke with pt and informed her of MRI results and lab results and Dr. Georgeann Oppenheim recommendations. Pt understands and agrees to the referral.

## 2019-05-16 NOTE — Progress Notes (Signed)
[name] , MD [address]  [place]  [place],  [address]  Main: [phone]  Fax: [phone]   Primary Care Physician: [name], MD  Virtual Visit via Video Note  I connected with patient on 05/16/19 at  2:45 PM EDT by video and verified that I am speaking with the correct person using two identifiers.   I discussed the limitations, risks, security and privacy concerns of performing an evaluation and management service by video  and the availability of in person appointments. I also discussed with the patient that there may be a patient responsible charge related to this service. The patient expressed understanding and agreed to proceed.  Location of Patient: Home Location of Provider: Home Persons involved: Patient and provider only   History of Present Illness: Chief Complaint  Patient presents with  . Follow-up    Discuss lab results    HPI: Jennifer Mosley is a 64 y.o. female   Summary of history :  She was initially referred and seen on 04/26/2019 for abdominal pain.  At her initial visit she had woken up a few weeks prior all of a sudden in the night and had felt deep pressure over the center of her chest. She had been on Nexium at that point of time which she has been taking for many years for reflux but this is something different.  It was nonradiating.  She has 2 sisters 57 and 6 of age who have coronary artery disease and have had heart attacks.    She also complained of nonspecific generalized abdominal discomfort on and off for a long time, nonradiating, no aggravating or relieving factors.  Denies any NSAID use.  03/15/2017 colonoscopy: multiple polyps were resected in the sigmoid transverse rectum and ascending colon majority of which were a combination of tubular adenomas and hyperplastic polyps. 03/29/2018 colonoscopy 3 sessile serrated polyps resected.  Plan was to repeat colonoscopy in 1 years time due to multiple polyps seen on  multiple occasions.   04/13/2019 ultrasound abdomen shows slightly nodular hepatic contour suggesting cirrhosis.  Complex nodule at the upper left kidney MR without and with contrast recommended.    Interval history 04/26/2019-05/16/2019  05/11/2019: MRI of the abdomen: Features suggestive of cirrhosis.  No enhancing lesions.  Features of portal hypertension including esophageal and gastric varices noted.  Complex hemorrhagic cyst arising from the upper pole of the kidney.  Cannot rule out small cystic neoplasm.  Colonoscopy and EGD scheduled for May 2021 05/02/2019: Underwent cardiac catheterization. 04/26/2019: Smooth muscle antibody is positive.  Antigliadin antibody positive.  Rest of work-up for viral and autoimmune hepatitis was negative.Immune to hepatitis A, not immune to hepatitis B  Computed MELD-Na score unavailable. Necessary lab results were not found in the last year. Computed MELD score unavailable. Necessary lab results were not found in the last year.   Current Outpatient Medications  Medication Sig Dispense Refill  . albuterol (PROAIR HFA) 108 (90 Base) MCG/ACT inhaler Inhale 2 puffs into the lungs every 4 (four) hours as needed for wheezing or shortness of breath.     Marland Kitchen aspirin 81 MG tablet Take 81 mg by mouth daily.    . enalapril (VASOTEC) 10 MG tablet Take 1 tablet (10 mg total) by mouth daily. 90 tablet 3  . esomeprazole (NEXIUM) 40 MG capsule Take 40 mg by mouth daily at 12 noon.    Marland Kitchen FLOVENT HFA 220 MCG/ACT inhaler Inhale 2 puffs into the lungs daily as  needed (shortness of breath).     . furosemide (LASIX) 20 MG tablet Take 20 mg by mouth daily.    Marland Kitchen levothyroxine (SYNTHROID) 100 MCG tablet Take 100 mcg by mouth daily.    . metoprolol tartrate (LOPRESSOR) 25 MG tablet Take 1 tablet (25 mg total) by mouth 2 (two) times daily. 60 tablet 5  . nitroGLYCERIN (NITROSTAT) 0.4 MG SL tablet Place 1 tablet (0.4 mg total) under the tongue every 5 (five) minutes as needed for  chest pain. 25 tablet 3  . oxybutynin (DITROPAN-XL) 10 MG 24 hr tablet Take 10 mg by mouth daily.    Marland Kitchen tiotropium (SPIRIVA HANDIHALER) 18 MCG inhalation capsule Place 18 mcg into inhaler and inhale daily.     . TRADJENTA 5 MG TABS tablet Take 5 mg by mouth daily.     No current facility-administered medications for this visit.    Allergies as of 05/16/2019  . (No Known Allergies)    Review of Systems:    All systems reviewed and negative except where noted in HPI.  General Appearance:    Alert, cooperative, no distress, appears stated age  Head:    Normocephalic, without obvious abnormality, atraumatic  Eyes:    PERRL, conjunctiva/corneas clear,  Ears:    Grossly normal hearing    Neurologic:  Grossly normal    Observations/Objective:  Labs: CMP     Component Value Date/Time   NA 141 04/28/2019 1421   NA 141 08/14/2013 1806   K 4.6 04/28/2019 1421   K 4.2 08/14/2013 1806   CL 103 04/28/2019 1421   CL 109 (H) 08/14/2013 1806   CO2 25 04/28/2019 1421   CO2 28 08/14/2013 1806   GLUCOSE 211 (H) 04/28/2019 1421   GLUCOSE 214 (H) 03/21/2017 1501   GLUCOSE 120 (H) 08/14/2013 1806   BUN 9 04/28/2019 1421   BUN 11 08/14/2013 1806   CREATININE 0.74 04/28/2019 1421   CREATININE 1.01 08/14/2013 1806   CALCIUM 9.3 04/28/2019 1421   CALCIUM 8.5 08/14/2013 1806   PROT 6.3 (L) 05/02/2019 0952   ALBUMIN 2.7 (L) 05/02/2019 0952   AST 54 (H) 05/02/2019 0952   ALT 34 05/02/2019 0952   ALKPHOS 191 (H) 05/02/2019 0952   BILITOT 0.9 05/02/2019 0952   GFRNONAA 87 04/28/2019 1421   GFRNONAA >60 08/14/2013 1806   GFRAA 100 04/28/2019 1421   GFRAA >60 08/14/2013 1806   Lab Results  Component Value Date   WBC 4.3 04/28/2019   HGB 14.6 04/28/2019   HCT 42.2 04/28/2019   MCV 91 04/28/2019   PLT 117 (L) 04/28/2019    Imaging Studies: MR Abdomen W Wo Contrast  Result Date: 05/11/2019 CLINICAL DATA:  Recent abnormal ultrasound. Complex left kidney cyst. EXAM: MRI ABDOMEN WITHOUT AND  WITH CONTRAST TECHNIQUE: Multiplanar multisequence MR imaging of the abdomen was performed both before and after the administration of intravenous contrast. CONTRAST:  19mL GADAVIST GADOBUTROL 1 MMOL/ML IV SOLN COMPARISON:  04/13/2019 FINDINGS: Lower chest: No acute findings. Hepatobiliary: The contour the liver is diffusely irregular compatible with cirrhosis. Relative hypertrophy of the lateral segment of left lobe of liver and caudate lobe noted. No hyperenhancing liver lesions. Pancreas: No mass, inflammatory changes, or other parenchymal abnormality identified. Spleen:  Within normal limits in size and appearance. Adrenals/Urinary Tract: The adrenal glands are unremarkable. The cyst arising from upper pole of left kidney measures 2.0 cm, image 17/6. This exhibits increased T1 and T2 signal compatible with a hemorrhagic cyst. There is a  small, solid-appearing peripheral nodule within this lesion measuring 3.5 mm. Enhancement characteristics of this nodule are difficult to assess due to its small size and motion artifact. No additional focal kidney lesions. Stomach/Bowel: Visualized portions within the abdomen are unremarkable. Vascular/Lymphatic: No abdominal aortic aneurysm demonstrated. Recanalization of the umbilical vein noted. The esophageal and gastric varices noted. Prominent retroperitoneal lymph nodes are identified which are nonspecific in the setting of cirrhosis. For example, there is a portacaval node with a short axis of 1.3 cm, image 32/6. Aortocaval lymph node measures 8 mm, image 52/6. Other:  No ascites or focal fluid collections. Musculoskeletal: No suspicious bone lesions identified. IMPRESSION: 1. Morphologic features of liver compatible with cirrhosis. No hyperenhancing liver lesions identified. 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 2. Complex, hemorrhagic cyst arises from upper pole of left kidney. This contains a small peripheral mural nodule  measuring 4 mm. Enhancement characteristics of this nodule or difficult to categorize due to small size of nodule and motion artifact. Cannot rule out small cystic neoplasm of the kidney. Advised urologic consultation for management Electronically Signed   By: Kerby Moors M.D.   On: 05/11/2019 10:20   CARDIAC CATHETERIZATION  Result Date: 05/02/2019 Conclusions: 1. Mild to moderate, non-obstructive coronary artery disease, including 30% distal LMCA and proximal LAD stenoses, as well as 50% ostial and proximal LCx lesions flanking high OM1. 2. Normal left ventricular contraction with mildly elevated filling pressure consistent with diastolic dysfunction.  Recommendations: 1. Continue medical therapy to prevent progression of disease.  We will check a lipid panel and LFTs with intention of adding statin therapy if possible in the setting of underlying liver disease. 2. Proceed with GI evaluation. Nelva Bush, MD [facility]  [place] ELASTOGRAPHY LIVER  Result Date: 05/03/2019 CLINICAL DATA:  Cirrhosis. EXAM: US LIVER ELASTOGRAPHY TECHNIQUE: Sonography of the liver was performed. In addition, ultrasound elastography evaluation of the liver was performed. A region of interest was placed within the right lobe of the liver. Following application of a compressive sonographic pulse, tissue compressibility was assessed. Multiple assessments were performed at the selected site. Median tissue compressibility was determined. Previously, hepatic stiffness was assessed by shear wave velocity. Based on recently published Society of Radiologists in Ultrasound consensus article, reporting is now recommended to be performed in the SI units of pressure (kiloPascals) representing hepatic stiffness/elasticity. The obtained result is compared to the published reference standards. (cACLD = compensated Advanced Chronic Liver Disease) COMPARISON:  Ultrasound of the abdomen 04/13/2019 FINDINGS: Liver: Coarse hepatic echotexture  without visualized focal lesion. Portal vein is patent on color Doppler imaging with normal direction of blood flow towards the liver. ULTRASOUND HEPATIC ELASTOGRAPHY Device: Siemens Helix VTQ Patient position: Oblique Transducer: 5 C1 Number of measurements: 10 Hepatic segment:  Hepatic subsegment VIII Median kPa: 10.9 IQR: 1.2 IQR/Median kPa ratio: 0.1 Data quality:  Good Diagnostic category: >9 kPa and ?13 kPa: suggestive of cACLD, but needs further testing IMPRESSION: ULTRASOUND LIVER: Sonographic features of cirrhosis with nodular hepatic contour and coarsened hepatic echotexture. Coarse echotexture limits assessment for detection of small lesions. ULTRASOUND HEPATIC ELASTOGRAPHY: Median kPa:  10.9 Diagnostic category: >9 kPa and ?13 kPa: suggestive of cACLD, but needs further testing The use of hepatic elastography is applicable to patients with viral hepatitis and non-alcoholic fatty liver disease. At this time, there is insufficient data for the referenced cut-off values and use in other causes of liver disease, including alcoholic liver disease. Patients, however, may be assessed by elastography and  serve as their own reference standard/baseline. In patients with non-alcoholic liver disease, the values suggesting compensated advanced chronic liver disease (cACLD) may be lower, and patients may need additional testing with elasticity results of 7-9 kPa. Please note that abnormal hepatic elasticity and shear wave velocities may also be identified in clinical settings other than with hepatic fibrosis, such as: acute hepatitis, elevated right heart and central venous pressures including use of beta blockers, veno-occlusive disease (Budd-Chiari), infiltrative processes such as mastocytosis/amyloidosis/infiltrative tumor/lymphoma, extrahepatic cholestasis, with hyperemia in the post-prandial state, and with liver transplantation. Correlation with patient history, laboratory data, and clinical condition  recommended. Diagnostic Categories: < or =5 kPa: high probability of being normal < or =9 kPa: in the absence of other known clinical signs, rules out cACLD >9 kPa and ?13 kPa: suggestive of cACLD, but needs further testing >13 kPa: highly suggestive of cACLD > or =17 kPa: highly suggestive of cACLD with an increased probability of clinically significant portal hypertension Electronically Signed   By: Zetta Bills M.D.   On: 05/03/2019 14:26    Assessment and Plan:   SHAYNEE GINGERY is a 64 y.o. y/o female here to follow-up for abdominal pain, cirrhosis of the liver.  Initial work-up suggested positive smooth muscle antibody and antigliadin antibody that is positive.  Need further evaluation.  Immune to hepatitis A but not immune hepatitis B.  History of multiple colonic polyps.  MRI of the abdomen incidentally found renal cyst with concern for possible cystic neoplasm.   Plan 1.  Surveillance colonoscopy due to history of sessile polyps, EGD to screen for esophageal varices and abdominal discomfort.  .  If multiple present on this occasion will need referral to genetic testing.   2.  Renal cyst has an appointment with urology already set up. 3.  Nodular appearance of the liver concerning for cirrhosis.  With positive smooth muscle antibody would suggest that we obtain a liver biopsy to determine if she does have autoimmune hepatitis.  If present may need immunosuppression.  Would suggest transjugular route.  Long discussion about the risks versus benefits that she would like to think about it and discuss at next office visit.  4.  Hep B vaccine 5.  Low-salt diet 6.  Check labs for meld score at her next office visit  Follow-up in 4 to 6 weeks   I discussed the assessment and treatment plan with the patient. The patient was provided an opportunity to ask questions and all were answered. The patient agreed with the plan and demonstrated an understanding of the instructions.   The patient  was advised to call back or seek an in-person evaluation if the symptoms worsen or if the condition fails to improve as anticipated.  I provided 20 minutes of face-to-face time during this encounter.  Dr [name] MD,MRCP [facility]) Gastroenterology/Hepatology Pager: [phone]   Speech recognition software was used to dictate this note.

## 2019-05-18 ENCOUNTER — Encounter: Payer: Self-pay | Admitting: Gastroenterology

## 2019-05-19 ENCOUNTER — Ambulatory Visit: Payer: Medicare Other | Admitting: Physician Assistant

## 2019-05-19 ENCOUNTER — Other Ambulatory Visit
Admission: RE | Admit: 2019-05-19 | Discharge: 2019-05-19 | Disposition: A | Discharge status: Home / Self Care | Payer: Medicare Other | Source: Ambulatory Visit | Attending: Gastroenterology | Admitting: Gastroenterology

## 2019-05-19 DIAGNOSIS — Z20822 Contact with and (suspected) exposure to covid-19: Secondary | ICD-10-CM | POA: Insufficient documentation

## 2019-05-19 DIAGNOSIS — Z01812 Encounter for preprocedural laboratory examination: Secondary | ICD-10-CM | POA: Diagnosis present

## 2019-05-19 LAB — SARS CORONAVIRUS 2 (TAT 6-24 HRS): SARS Coronavirus 2: NEGATIVE

## 2019-05-22 ENCOUNTER — Encounter: Payer: Self-pay | Admitting: Physician Assistant

## 2019-05-23 ENCOUNTER — Other Ambulatory Visit: Payer: Self-pay

## 2019-05-23 ENCOUNTER — Encounter: Payer: Self-pay | Admitting: Gastroenterology

## 2019-05-23 ENCOUNTER — Ambulatory Visit
Admission: RE | Admit: 2019-05-23 | Discharge: 2019-05-23 | Disposition: A | Discharge status: Home / Self Care | Payer: Medicare Other | Attending: Gastroenterology | Admitting: Gastroenterology

## 2019-05-23 ENCOUNTER — Ambulatory Visit: Payer: Medicare Other | Admitting: Anesthesiology

## 2019-05-23 ENCOUNTER — Encounter
Admission: RE | Disposition: A | Discharge status: Home / Self Care | Payer: Self-pay | Source: Home / Self Care | Attending: Gastroenterology

## 2019-05-23 DIAGNOSIS — F419 Anxiety disorder, unspecified: Secondary | ICD-10-CM | POA: Diagnosis not present

## 2019-05-23 DIAGNOSIS — E11610 Type 2 diabetes mellitus with diabetic neuropathic arthropathy: Secondary | ICD-10-CM | POA: Diagnosis not present

## 2019-05-23 DIAGNOSIS — D125 Benign neoplasm of sigmoid colon: Secondary | ICD-10-CM | POA: Insufficient documentation

## 2019-05-23 DIAGNOSIS — E785 Hyperlipidemia, unspecified: Secondary | ICD-10-CM | POA: Insufficient documentation

## 2019-05-23 DIAGNOSIS — E669 Obesity, unspecified: Secondary | ICD-10-CM | POA: Insufficient documentation

## 2019-05-23 DIAGNOSIS — Z803 Family history of malignant neoplasm of breast: Secondary | ICD-10-CM | POA: Diagnosis not present

## 2019-05-23 DIAGNOSIS — Z79899 Other long term (current) drug therapy: Secondary | ICD-10-CM | POA: Insufficient documentation

## 2019-05-23 DIAGNOSIS — J449 Chronic obstructive pulmonary disease, unspecified: Secondary | ICD-10-CM | POA: Insufficient documentation

## 2019-05-23 DIAGNOSIS — F329 Major depressive disorder, single episode, unspecified: Secondary | ICD-10-CM | POA: Insufficient documentation

## 2019-05-23 DIAGNOSIS — I8510 Secondary esophageal varices without bleeding: Secondary | ICD-10-CM | POA: Diagnosis not present

## 2019-05-23 DIAGNOSIS — Z7984 Long term (current) use of oral hypoglycemic drugs: Secondary | ICD-10-CM | POA: Diagnosis not present

## 2019-05-23 DIAGNOSIS — K7469 Other cirrhosis of liver: Secondary | ICD-10-CM | POA: Diagnosis not present

## 2019-05-23 DIAGNOSIS — I10 Essential (primary) hypertension: Secondary | ICD-10-CM | POA: Insufficient documentation

## 2019-05-23 DIAGNOSIS — K5730 Diverticulosis of large intestine without perforation or abscess without bleeding: Secondary | ICD-10-CM | POA: Diagnosis not present

## 2019-05-23 DIAGNOSIS — K640 First degree hemorrhoids: Secondary | ICD-10-CM | POA: Diagnosis not present

## 2019-05-23 DIAGNOSIS — Z86010 Personal history of colonic polyps: Secondary | ICD-10-CM | POA: Insufficient documentation

## 2019-05-23 DIAGNOSIS — Z8349 Family history of other endocrine, nutritional and metabolic diseases: Secondary | ICD-10-CM | POA: Insufficient documentation

## 2019-05-23 DIAGNOSIS — D123 Benign neoplasm of transverse colon: Secondary | ICD-10-CM | POA: Diagnosis not present

## 2019-05-23 DIAGNOSIS — Z1211 Encounter for screening for malignant neoplasm of colon: Secondary | ICD-10-CM | POA: Diagnosis present

## 2019-05-23 DIAGNOSIS — R0602 Shortness of breath: Secondary | ICD-10-CM | POA: Insufficient documentation

## 2019-05-23 DIAGNOSIS — Z7951 Long term (current) use of inhaled steroids: Secondary | ICD-10-CM | POA: Diagnosis not present

## 2019-05-23 DIAGNOSIS — Z6838 Body mass index (BMI) 38.0-38.9, adult: Secondary | ICD-10-CM | POA: Insufficient documentation

## 2019-05-23 DIAGNOSIS — K3189 Other diseases of stomach and duodenum: Secondary | ICD-10-CM | POA: Diagnosis not present

## 2019-05-23 DIAGNOSIS — Z8673 Personal history of transient ischemic attack (TIA), and cerebral infarction without residual deficits: Secondary | ICD-10-CM | POA: Diagnosis not present

## 2019-05-23 DIAGNOSIS — Z7982 Long term (current) use of aspirin: Secondary | ICD-10-CM | POA: Diagnosis not present

## 2019-05-23 DIAGNOSIS — K635 Polyp of colon: Secondary | ICD-10-CM

## 2019-05-23 DIAGNOSIS — K766 Portal hypertension: Secondary | ICD-10-CM | POA: Insufficient documentation

## 2019-05-23 DIAGNOSIS — Z87891 Personal history of nicotine dependence: Secondary | ICD-10-CM | POA: Insufficient documentation

## 2019-05-23 DIAGNOSIS — Z8249 Family history of ischemic heart disease and other diseases of the circulatory system: Secondary | ICD-10-CM | POA: Diagnosis not present

## 2019-05-23 HISTORY — PX: ESOPHAGOGASTRODUODENOSCOPY (EGD) WITH PROPOFOL: SHX5813

## 2019-05-23 HISTORY — PX: COLONOSCOPY WITH PROPOFOL: SHX5780

## 2019-05-23 LAB — GLUCOSE, CAPILLARY: Glucose-Capillary: 104 mg/dL — ABNORMAL HIGH (ref 70–99)

## 2019-05-23 SURGERY — COLONOSCOPY WITH PROPOFOL
Anesthesia: General

## 2019-05-23 MED ORDER — PROPOFOL 500 MG/50ML IV EMUL
INTRAVENOUS | Status: AC
  Filled 2019-05-23: qty 50

## 2019-05-23 MED ORDER — EPHEDRINE 5 MG/ML INJ
INTRAVENOUS | Status: AC
  Filled 2019-05-23: qty 4

## 2019-05-23 MED ORDER — MIDAZOLAM HCL 2 MG/2ML IJ SOLN
INTRAMUSCULAR | Status: AC
  Filled 2019-05-23: qty 2

## 2019-05-23 MED ORDER — EPHEDRINE SULFATE 50 MG/ML IJ SOLN
INTRAMUSCULAR | Status: DC | PRN
  Administered 2019-05-23 (×2): 10 mg via INTRAVENOUS

## 2019-05-23 MED ORDER — PROPOFOL 500 MG/50ML IV EMUL
INTRAVENOUS | Status: DC | PRN
  Administered 2019-05-23: 120 ug/kg/min via INTRAVENOUS

## 2019-05-23 MED ORDER — LIDOCAINE HCL (CARDIAC) PF 100 MG/5ML IV SOSY
PREFILLED_SYRINGE | INTRAVENOUS | Status: DC | PRN
  Administered 2019-05-23: 50 mg via INTRAVENOUS

## 2019-05-23 MED ORDER — MIDAZOLAM HCL 2 MG/2ML IJ SOLN
INTRAMUSCULAR | Status: DC | PRN
  Administered 2019-05-23: 2 mg via INTRAVENOUS

## 2019-05-23 MED ORDER — BUTAMBEN-TETRACAINE-BENZOCAINE 2-2-14 % EX AERO
INHALATION_SPRAY | CUTANEOUS | Status: DC | PRN
  Administered 2019-05-23: 2 via TOPICAL

## 2019-05-23 MED ORDER — ONDANSETRON HCL 4 MG/2ML IJ SOLN
INTRAMUSCULAR | Status: AC
  Filled 2019-05-23: qty 2

## 2019-05-23 MED ORDER — FENTANYL CITRATE (PF) 100 MCG/2ML IJ SOLN
INTRAMUSCULAR | Status: DC | PRN
  Administered 2019-05-23 (×2): 25 ug via INTRAVENOUS
  Administered 2019-05-23: 50 ug via INTRAVENOUS

## 2019-05-23 MED ORDER — FENTANYL CITRATE (PF) 100 MCG/2ML IJ SOLN
INTRAMUSCULAR | Status: AC
  Filled 2019-05-23: qty 2

## 2019-05-23 MED ORDER — BUTAMBEN-TETRACAINE-BENZOCAINE 2-2-14 % EX AERO
INHALATION_SPRAY | CUTANEOUS | Status: AC
  Filled 2019-05-23: qty 5

## 2019-05-23 MED ORDER — SODIUM CHLORIDE 0.9 % IV SOLN: INTRAVENOUS | Status: DC

## 2019-05-23 MED ORDER — ONDANSETRON HCL 4 MG/2ML IJ SOLN
INTRAMUSCULAR | Status: DC | PRN
  Administered 2019-05-23: 4 mg via INTRAVENOUS

## 2019-05-23 NOTE — Op Note (Signed)
[facility] Gastroenterology Patient Name: Jennifer Mosley Procedure Date: 05/23/2019 7:31 AM MRN: [id number] Account #: [id number] Date of Birth: 03/09/1955 Admit Type: Outpatient Age: 64 Room: [facility] ENDO ROOM 1 Gender: Female Note Status: Finalized Procedure:             Upper GI endoscopy Indications:           Cirrhosis rule out esophageal varices Providers:             [name] MD, MD Referring MD:          [name], MD (Referring MD) Medicines:             Monitored Anesthesia Care Complications:         No immediate complications. Procedure:             Pre-Anesthesia Assessment:                        - ASA Grade Assessment: III - A patient with severe                         systemic disease.                        After obtaining informed consent, the endoscope was                         passed under direct vision. Throughout the procedure,                         the patient's blood pressure, pulse, and oxygen                         saturations were monitored continuously. The Endoscope                         was introduced through the mouth, and advanced to the                         third part of duodenum. The upper GI endoscopy was                         accomplished with ease. The patient tolerated the                         procedure well. Findings:      Grade I varices were found in the lower third of the esophagus. They       were small in size.      The examined duodenum was normal.      Mild portal hypertensive gastropathy was found in the entire examined       stomach.      The cardia and gastric fundus were normal on retroflexion. Impression:            - Grade I esophageal varices.                        - Normal examined duodenum.                        - Portal hypertensive gastropathy.                        -  No specimens collected. Recommendation:        - Perform a colonoscopy today.                        - Repeat  upper endoscopy in 1 year for surveillance. Procedure Code(s):     --- Professional ---                        [phone], Esophagogastroduodenoscopy, flexible,                         transoral; diagnostic, including collection of                         specimen(s) by brushing or washing, when performed                         (separate procedure) Diagnosis Code(s):     --- Professional ---                        K74.60, Unspecified cirrhosis of liver                        I85.10, Secondary esophageal varices without bleeding                        K76.6, Portal hypertension                        K31.89, Other diseases of stomach and duodenum CPT copyright 2019 American Medical Association. All rights reserved. The codes documented in this report are preliminary and upon coder review may  be revised to meet current compliance requirements. [name], MD [name] MD, MD 05/23/2019 8:16:00 AM This report has been signed electronically. Number of Addenda: 0 Note Initiated On: 05/23/2019 7:31 AM Estimated Blood Loss:  Estimated blood loss: none.      [facility]

## 2019-05-23 NOTE — Anesthesia Preprocedure Evaluation (Addendum)
Anesthesia Evaluation  Patient identified by MRN, date of birth, ID band Patient awake    Reviewed: Allergy & Precautions, H&P , NPO status , Patient's Chart, lab work & pertinent test results  Airway Mallampati: II  TM Distance: >3 FB     Dental  (+) Upper Dentures, Lower Dentures   Pulmonary shortness of breath and with exertion, COPD, former smoker,    breath sounds clear to auscultation       Cardiovascular hypertension,  Rhythm:regular Rate:Normal     Neuro/Psych PSYCHIATRIC DISORDERS Anxiety Depression CVA    GI/Hepatic negative GI ROS, Neg liver ROS,   Endo/Other  diabetes  Renal/GU negative Renal ROS  negative genitourinary   Musculoskeletal   Abdominal   Peds  Hematology negative hematology ROS (+)   Anesthesia Other Findings Obesity  Past Medical History: No date: Anxiety No date: COPD (chronic obstructive pulmonary disease) (HCC) No date: Depression No date: Essential hypertension No date: Hyperlipidemia No date: Hypertension No date: Stroke [facility]) No date: 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 ([place])  Past Surgical History: No date: BILATERAL CARPAL TUNNEL RELEASE No date: COLONOSCOPY 03/15/2017: COLONOSCOPY WITH PROPOFOL; N/A     Comment:  Procedure: COLONOSCOPY WITH PROPOFOL;  Surgeon: [name], MD;  Location: [facility] ENDOSCOPY;  Service:               Gastroenterology;  Laterality: N/A; 03/29/2018: COLONOSCOPY WITH PROPOFOL; N/A     Comment:  Procedure: COLONOSCOPY WITH PROPOFOL;  Surgeon: [name], MD;  Location: [facility] ENDOSCOPY;  Service:               Gastroenterology;  Laterality: N/A; No date: ECTOPIC PREGNANCY SURGERY 05/02/2019: LEFT HEART CATH AND CORONARY ANGIOGRAPHY; Left     Comment:  Procedure: LEFT HEART CATH AND CORONARY ANGIOGRAPHY;                Surgeon: Nelva Bush, MD;  Location: [place]               CV LAB;  Service:  Cardiovascular;  Laterality: Left;     Reproductive/Obstetrics negative OB ROS                            Anesthesia Physical Anesthesia Plan  ASA: III  Anesthesia Plan: General   Post-op Pain Management:    Induction:   PONV Risk Score and Plan: Propofol infusion and TIVA  Airway Management Planned: Natural Airway and Nasal Cannula  Additional Equipment:   Intra-op Plan:   Post-operative Plan:   Informed Consent: I have reviewed the patients History and Physical, chart, labs and discussed the procedure including the risks, benefits and alternatives for the proposed anesthesia with the patient or authorized representative who has indicated his/her understanding and acceptance.     Dental Advisory Given  Plan Discussed with: Anesthesiologist  Anesthesia Plan Comments:         Anesthesia Quick Evaluation

## 2019-05-23 NOTE — Op Note (Signed)
[facility] Gastroenterology Patient Name: Jennifer Mosley Procedure Date: 05/23/2019 7:30 AM MRN: [id number] Account #: [id number] Date of Birth: September 08, 1955 Admit Type: Outpatient Age: 64 Room: [facility] ENDO ROOM 1 Gender: Female Note Status: Finalized Procedure:             Colonoscopy Indications:           High risk colon cancer surveillance: Personal history                         of colonic polyps Providers:             [name] MD, MD Referring MD:          [name], MD (Referring MD) Medicines:             Monitored Anesthesia Care Complications:         No immediate complications. Procedure:             Pre-Anesthesia Assessment:                        - Prior to the procedure, a History and Physical was                         performed, and patient medications, allergies and                         sensitivities were reviewed. The patient's tolerance                         of previous anesthesia was reviewed.                        - The risks and benefits of the procedure and the                         sedation options and risks were discussed with the                         patient. All questions were answered and informed                         consent was obtained.                        - ASA Grade Assessment: III - A patient with severe                         systemic disease.                        After obtaining informed consent, the colonoscope was                         passed under direct vision. Throughout the procedure,                         the patient's blood pressure, pulse, and oxygen                         saturations were monitored continuously. The  Colonoscope was introduced through the anus and                         advanced to the the cecum, identified by the                         appendiceal orifice. The colonoscopy was performed                         with ease. The patient tolerated  the procedure well.                         The quality of the bowel preparation was good. Findings:      The perianal and digital rectal examinations were normal.      Multiple small-mouthed diverticula were found in the sigmoid colon.      Non-bleeding internal hemorrhoids were found during retroflexion. The       hemorrhoids were large and Grade I (internal hemorrhoids that do not       prolapse).      Five sessile polyps were found in the sigmoid colon and transverse       colon. The polyps were 3 to 6 mm in size. These polyps were removed with       a cold snare. Resection and retrieval were complete. To prevent bleeding       after the polypectomy, one hemostatic clip was successfully placed.       There was no bleeding during, or at the end, of the procedure.      The exam was otherwise without abnormality on direct and retroflexion       views. Impression:            - Diverticulosis in the sigmoid colon.                        - Non-bleeding internal hemorrhoids.                        - Five 3 to 6 mm polyps in the sigmoid colon and in                         the transverse colon, removed with a cold snare.                         Resected and retrieved. Clip was placed.                        - The examination was otherwise normal on direct and                         retroflexion views. Recommendation:        - Discharge patient to home (with escort).                        - Resume previous diet.                        - Continue present medications.                        -  Await pathology results.                        - Repeat colonoscopy for surveillance based on                         pathology results.                        - Return to my office as previously scheduled. Procedure Code(s):     --- Professional ---                        [id number], Colonoscopy, flexible; with removal of                         tumor(s), polyp(s), or other lesion(s) by snare                          technique Diagnosis Code(s):     --- Professional ---                        Z86.010, Personal history of colonic polyps                        K64.0, First degree hemorrhoids                        K63.5, Polyp of colon                        K57.30, Diverticulosis of large intestine without                         perforation or abscess without bleeding CPT copyright 2019 American Medical Association. All rights reserved. The codes documented in this report are preliminary and upon coder review may  be revised to meet current compliance requirements. [name], MD [name] MD, MD 05/23/2019 8:41:19 AM This report has been signed electronically. Number of Addenda: 0 Note Initiated On: 05/23/2019 7:30 AM Scope Withdrawal Time: 0 hours 13 minutes 26 seconds  Total Procedure Duration: 0 hours 22 minutes 29 seconds  Estimated Blood Loss:  Estimated blood loss: none.      [facility]

## 2019-05-23 NOTE — Transfer of Care (Signed)
Immediate Anesthesia Transfer of Care Note  Patient: Jennifer Mosley  Procedure(s) Performed: COLONOSCOPY WITH PROPOFOL (N/A ) ESOPHAGOGASTRODUODENOSCOPY (EGD) WITH PROPOFOL (N/A )  Patient Location: PACU  Anesthesia Type:General  Level of Consciousness: awake and sedated  Airway & Oxygen Therapy: Patient Spontanous Breathing and Patient connected to nasal cannula oxygen  Post-op Assessment: Report given to RN and Post -op Vital signs reviewed and stable  Post vital signs: Reviewed and stable  Last Vitals:  Vitals Value Taken Time  BP    Temp    Pulse    Resp    SpO2      Last Pain:  Vitals:   05/23/19 0720  TempSrc: Temporal         Complications: No apparent anesthesia complications

## 2019-05-23 NOTE — H&P (Signed)
[name], MD [address] [place], [place], [place], [address] [address], [place], [place], [place], [address] Phone: [phone]  Fax: [phone]  Primary Care Physician:  [name], MD   Pre-Procedure History & Physical: HPI:  Jennifer Mosley is a 64 y.o. female is here for an endoscopy and colonoscopy    Past Medical History:  Diagnosis Date  . Anxiety   . COPD (chronic obstructive pulmonary disease) ([place])   . Depression   . Essential hypertension   . Hyperlipidemia   . Hypertension   . Stroke ([place])   . 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 BILATERAL CARPAL TUNNEL RELEASE    . COLONOSCOPY    . COLONOSCOPY WITH PROPOFOL N/A 03/15/2017   Procedure: COLONOSCOPY WITH PROPOFOL;  Surgeon: [name], MD;  Location: [facility] ENDOSCOPY;  Service: Gastroenterology;  Laterality: N/A;  . COLONOSCOPY WITH PROPOFOL N/A 03/29/2018   Procedure: COLONOSCOPY WITH PROPOFOL;  Surgeon: [name], MD;  Location: [facility] ENDOSCOPY;  Service: Gastroenterology;  Laterality: N/A;  . ECTOPIC PREGNANCY SURGERY    . LEFT HEART CATH AND CORONARY ANGIOGRAPHY Left 05/02/2019   Procedure: LEFT HEART CATH AND CORONARY ANGIOGRAPHY;  Surgeon: Nelva Bush, MD;  Location: [place] CV LAB;  Service: Cardiovascular;  Laterality: Left;    Prior to Admission medications   Medication Sig Start Date End Date Taking? Authorizing Provider  albuterol (PROAIR HFA) 108 (90 Base) MCG/ACT inhaler Inhale 2 puffs into the lungs every 4 (four) hours as needed for wheezing or shortness of breath.    Yes [provider]  enalapril (VASOTEC) 10 MG tablet Take 1 tablet (10 mg total) by mouth daily. 10/16/14  Yes Wellington Hampshire, MD  esomeprazole (NEXIUM) 40 MG capsule Take 40 mg by mouth daily at 12 noon.   Yes [provider]  levothyroxine (SYNTHROID) 100 MCG tablet Take 100 mcg by mouth daily.  04/24/19  Yes [provider]  metoprolol tartrate (LOPRESSOR) 25 MG tablet Take 1 tablet (25 mg total) by mouth 2 (two) times daily. 04/28/19 07/27/19 Yes End, Harrell Gave, MD  oxybutynin (DITROPAN-XL) 10 MG 24 hr tablet Take 10 mg by mouth daily. 02/07/19  Yes [provider]  TRADJENTA 5 MG TABS tablet Take 5 mg by mouth daily. 02/07/19  Yes [provider]  aspirin 81 MG tablet Take 81 mg by mouth daily.    [provider]  FLOVENT HFA 220 MCG/ACT inhaler Inhale 2 puffs into the lungs daily as needed (shortness of breath).  04/06/19   [provider]  furosemide (LASIX) 20 MG tablet Take 20 mg by mouth daily. 04/24/19   [provider]  nitroGLYCERIN (NITROSTAT) 0.4 MG SL tablet Place 1 tablet (0.4 mg total) under the tongue every 5 (five) minutes as needed for chest pain. 04/28/19 07/27/19  End, Harrell Gave, MD  tiotropium (SPIRIVA HANDIHALER) 18 MCG inhalation capsule Place 18 mcg into inhaler and inhale daily.     [provider]    Allergies as of 05/11/2019  . (No Known Allergies)    Family History  Problem Relation Age of Onset  . Hypertension Mother   . Hyperlipidemia Mother   . Coronary artery disease Father 43       3-vessel CABG  . Breast cancer Cousin   . Heart attack Sister 35  . Heart attack Brother 55    Social History  Socioeconomic History  . Marital status: Widowed    Spouse name: Not on file  . Number of children: Not on file  . Years of education: Not on file  . Highest education level: Not on file  Occupational History  . Not on file  Tobacco Use  . Smoking status: Former Smoker    Packs/day: 0.50    Years: 37.00    Pack years: 18.50    Types: Cigarettes    Quit date: 03/16/2015    Years since quitting: 4.1  . Smokeless tobacco: Never Used  Substance and Sexual Activity  . Alcohol use: Yes    Alcohol/week: 1.0 standard drinks    Types: 1 Cans of beer per week  . Drug use: No  . Sexual  activity: Not on file  Other Topics Concern  . Not on file  Social History Narrative  . Not on file   Social Determinants of Health   Financial Resource Strain:   . Difficulty of Paying Living Expenses:   Food Insecurity:   . Worried About Charity fundraiser in the Last Year:   . Arboriculturist in the Last Year:   Transportation Needs:   . Film/video editor (Medical):   Marland Kitchen Lack of Transportation (Non-Medical):   Physical Activity:   . Days of Exercise per Week:   . Minutes of Exercise per Session:   Stress:   . Feeling of Stress :   Social Connections:   . Frequency of Communication with Friends and Family:   . Frequency of Social Gatherings with Friends and Family:   . Attends Religious Services:   . Active Member of Clubs or Organizations:   . Attends Archivist Meetings:   Marland Kitchen Marital Status:   Intimate Partner Violence:   . Fear of Current or Ex-Partner:   . Emotionally Abused:   Marland Kitchen Physically Abused:   . Sexually Abused:     Review of Systems: See HPI, otherwise negative ROS  Physical Exam: BP (!) 150/83   Pulse 62   Temp 98.5 F (36.9 C) (Temporal)   Resp 18   Ht 5\' 4"  (1.626 m)   Wt 101.6 kg   SpO2 95%   BMI 38.45 kg/m  General:   Alert,  pleasant and cooperative in NAD Head:  Normocephalic and atraumatic. Neck:  Supple; no masses or thyromegaly. Lungs:  Clear throughout to auscultation, normal respiratory effort.    Heart:  +S1, +S2, Regular rate and rhythm, No edema. Abdomen:  Soft, nontender and nondistended. Normal bowel sounds, without guarding, and without rebound.   Neurologic:  Alert and  oriented x4;  grossly normal neurologically.  Impression/Plan: Jennifer Mosley is here for an endoscopy and colonoscopy  to be performed for  evaluation of esophageal varices and colon polyp surveillance.     Risks, benefits, limitations, and alternatives regarding endoscopy have been reviewed with the patient.  Questions have been answered.   All parties agreeable.   [name], MD  05/23/2019, 8:05 AM

## 2019-05-24 ENCOUNTER — Encounter: Payer: Self-pay | Admitting: *Deleted

## 2019-05-24 LAB — SURGICAL PATHOLOGY

## 2019-05-25 ENCOUNTER — Encounter: Payer: Self-pay | Admitting: Gastroenterology

## 2019-05-25 NOTE — Anesthesia Postprocedure Evaluation (Signed)
Anesthesia Post Note  Patient: Jennifer Mosley  Procedure(s) Performed: COLONOSCOPY WITH PROPOFOL (N/A ) ESOPHAGOGASTRODUODENOSCOPY (EGD) WITH PROPOFOL (N/A )  Patient location during evaluation: PACU Anesthesia Type: General Level of consciousness: awake and alert Pain management: pain level controlled Vital Signs Assessment: post-procedure vital signs reviewed and stable Respiratory status: spontaneous breathing, nonlabored ventilation and respiratory function stable Cardiovascular status: blood pressure returned to baseline and stable Postop Assessment: no apparent nausea or vomiting Anesthetic complications: no     Last Vitals:  Vitals:   05/23/19 0848 05/23/19 0908  BP: 113/62   Pulse:    Resp:  17  Temp: 36.8 C   SpO2:      Last Pain:  Vitals:   05/24/19 0736  TempSrc:   PainSc: 0-No pain                 Tera Mater

## 2019-05-31 ENCOUNTER — Telehealth: Payer: Self-pay

## 2019-05-31 ENCOUNTER — Encounter: Payer: Self-pay | Admitting: Family

## 2019-05-31 ENCOUNTER — Ambulatory Visit (INDEPENDENT_AMBULATORY_CARE_PROVIDER_SITE_OTHER): Payer: Medicare Other | Admitting: Family

## 2019-05-31 ENCOUNTER — Other Ambulatory Visit: Payer: Self-pay

## 2019-05-31 VITALS — BP 156/74 | HR 73 | Ht 64.5 in | Wt 235.4 lb

## 2019-05-31 DIAGNOSIS — I25118 Atherosclerotic heart disease of native coronary artery with other forms of angina pectoris: Secondary | ICD-10-CM | POA: Diagnosis not present

## 2019-05-31 DIAGNOSIS — E785 Hyperlipidemia, unspecified: Secondary | ICD-10-CM

## 2019-05-31 DIAGNOSIS — Z86010 Personal history of colonic polyps: Secondary | ICD-10-CM

## 2019-05-31 DIAGNOSIS — I5189 Other ill-defined heart diseases: Secondary | ICD-10-CM | POA: Diagnosis not present

## 2019-05-31 DIAGNOSIS — R600 Localized edema: Secondary | ICD-10-CM

## 2019-05-31 DIAGNOSIS — R748 Abnormal levels of other serum enzymes: Secondary | ICD-10-CM

## 2019-05-31 MED ORDER — ISOSORBIDE MONONITRATE ER 30 MG PO TB24: 30.0000 mg | ORAL_TABLET | Freq: Every day | ORAL | 2 refills | Status: DC

## 2019-05-31 NOTE — Progress Notes (Signed)
Office Visit    Patient Name: Jennifer Mosley Date of Encounter: 05/31/2019  Primary Care Provider:  [name], MD Primary Cardiologist:  Nelva Bush, MD Electrophysiologist:  None   Chief Complaint    Jennifer Mosley is a 64 y.o. female with a hx of HTN, HLD, Dm2, CVA, COPD, anxiety, CAD, diastolic dysfunction presents today for follow up after cardiac catheterization.    Past Medical History    Past Medical History:  Diagnosis Date  . Anxiety   . COPD (chronic obstructive pulmonary disease) ([place])   . Depression   . Essential hypertension   . Hyperlipidemia   . Hypertension   . Stroke ([place])   . 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 BILATERAL CARPAL TUNNEL RELEASE    . COLONOSCOPY    . COLONOSCOPY WITH PROPOFOL N/A 03/15/2017   Procedure: COLONOSCOPY WITH PROPOFOL;  Surgeon: [name], MD;  Location: [facility] ENDOSCOPY;  Service: Gastroenterology;  Laterality: N/A;  . COLONOSCOPY WITH PROPOFOL N/A 03/29/2018   Procedure: COLONOSCOPY WITH PROPOFOL;  Surgeon: [name], MD;  Location: [facility] ENDOSCOPY;  Service: Gastroenterology;  Laterality: N/A;  . COLONOSCOPY WITH PROPOFOL N/A 05/23/2019   Procedure: COLONOSCOPY WITH PROPOFOL;  Surgeon: [name], MD;  Location: [facility] ENDOSCOPY;  Service: Gastroenterology;  Laterality: N/A;  . ECTOPIC PREGNANCY SURGERY    . ESOPHAGOGASTRODUODENOSCOPY (EGD) WITH PROPOFOL N/A 05/23/2019   Procedure: ESOPHAGOGASTRODUODENOSCOPY (EGD) WITH PROPOFOL;  Surgeon: [name], MD;  Location: [facility] ENDOSCOPY;  Service: Gastroenterology;  Laterality: N/A;  . LEFT HEART CATH AND CORONARY ANGIOGRAPHY Left 05/02/2019   Procedure: LEFT HEART CATH AND CORONARY ANGIOGRAPHY;  Surgeon: Nelva Bush, MD;  Location: [place] CV LAB;  Service: Cardiovascular;  Laterality: Left;    Allergies  No Known Allergies  History of Present Illness    Jennifer Mosley is a 64 y.o. female with a hx of TN, HLD, Dm2, multiple CVA, COPD, anxiety, CAD, diastolic dysfunction. She was last seen in clinic 04/28/19 by Dr. Saunders Revel.  Previously evaluated by Dr. Fletcher Anon in 2016 with normal pharmacological myocardial perfusion stress test. Echo 2017 in setting of CVA with mild LVH, LVEF 55-60%, gr1DD. Was seen by Dr. Saunders Revel 04/28/19 at request of Dr. Vicente Males of GI for one year history of chest discomfort with exertion, stable exertional dyspnea, and chronic LE edema. She was recommended for cardiac catheterization, started on Metoprolol tartrate.   Cardiac catheterization 05/02/19 with mild to moderate nonobstructive CAD (30% distal LMCA and prox LAD, 50% ostial and prox LCx lesion flanking high OM1) and normal LVEF with mildly elevated filling pressures consistent with diastolic dysfunction. She was started on Aspirin 81mg  daily. Recommended for statin pending review of her liver function. Labs 05/02/19 with AST 54, ALT 34, alkaliline phosphatase 191, albumin 2.7. Cholesterol panel with total cholesterol 208, HDL 48, LDL 144, triglycerides 81.   Has had colonoscopy with Dr. Vicente Males of GI since catheterization. Previous workup includes resection of multiple polyps on 03/15/17, 03/29/18, and 05/23/19. Her abdominal ultrasound 04/13/19 showed slightly nodular hepatic contour suggestive of cirrhosis. She was recommended for liver biopsy and genetic testing regarding polyps.  She told Dr. Vicente Males she was going to think about the biopsy as she had concerns that she has had a previous kidney biopsy and did not enjoy the experience.  Family history notable for 2 sisters with CAD.   Does not monitor her blood pressure at home.  Offered to send her a free blood pressure cuff she tells me she is not interested in checking her blood pressure at home.  Does endorse that she missed her medications this morning.  We discussed the importance of taking her medications regularly.  Reviewed her cardiac catheterization  results showing mild to moderate nonobstructive coronary disease.  Reviewed secondary prevention including aspirin, beta-blocker, cholesterol management. Tells me her mom took Lipitor for years and was diagnosed with cirrhosis and per her report a medical provider told her the cirrhosis was due to atorvastatin. She herself was on Atorvastatin previously and discontinued after her mother died of cirrhosis.  Low suspicion that the cirrhosis was caused by atorvastatin.  We discussed referral to cardiac rehab but she declines.  Encourage regular cardiovascular exercise.  She does endorse continued dyspnea on exertion.  Reports no recurrent chest pain, pressure, tightness.  Reports her lower extremity edema has improved.  Her 57 year old granddaughter lives with her.  EKGs/Labs/Other Studies Reviewed:   The following studies were reviewed today:  LHC 05/02/2019 Conclusions: 1. Mild to moderate, non-obstructive coronary artery disease, including 30% distal LMCA and proximal LAD stenoses, as well as 50% ostial and proximal LCx lesions flanking high OM1. 2. Normal left ventricular contraction with mildly elevated filling pressure consistent with diastolic dysfunction.   Recommendations: 1. Continue medical therapy to prevent progression of disease.  We will check a lipid panel and LFTs with intention of adding statin therapy if possible in the setting of underlying liver disease. 2. Proceed with GI evaluation.  EKG:  EKG is ordered today.  The ekg ordered today demonstrates NSR 73 bpm with LVH and stable mildly prolonged QT (QTc 489)  Recent Labs: 04/28/2019: BUN 9; Creatinine, Ser 0.74; Hemoglobin 14.6; Platelets 117; Potassium 4.6; Sodium 141 05/02/2019: ALT 34  Recent Lipid Panel    Component Value Date/Time   CHOL 208 (H) 05/02/2019 0952   TRIG 81 05/02/2019 0952   HDL 48 05/02/2019 0952   CHOLHDL 4.3 05/02/2019 0952   VLDL 16 05/02/2019 0952   LDLCALC 144 (H) 05/02/2019 0952    Home  Medications   Current Meds  Medication Sig  . albuterol (PROAIR HFA) 108 (90 Base) MCG/ACT inhaler Inhale 2 puffs into the lungs every 4 (four) hours as needed for wheezing or shortness of breath.   Marland Kitchen aspirin 81 MG tablet Take 81 mg by mouth daily.  . enalapril (VASOTEC) 10 MG tablet Take 1 tablet (10 mg total) by mouth daily.  Marland Kitchen esomeprazole (NEXIUM) 40 MG capsule Take 40 mg by mouth daily at 12 noon.  Marland Kitchen FLOVENT HFA 220 MCG/ACT inhaler Inhale 2 puffs into the lungs daily as needed (shortness of breath).   . furosemide (LASIX) 20 MG tablet Take 20 mg by mouth daily.  Marland Kitchen levothyroxine (SYNTHROID) 100 MCG tablet Take 100 mcg by mouth daily.  . metoprolol tartrate (LOPRESSOR) 25 MG tablet Take 1 tablet (25 mg total) by mouth 2 (two) times daily.  . nitroGLYCERIN (NITROSTAT) 0.4 MG SL tablet Place 1 tablet (0.4 mg total) under the tongue every 5 (five) minutes as needed for chest pain.  Marland Kitchen oxybutynin (DITROPAN-XL) 10 MG 24 hr tablet Take 10 mg by mouth daily.  Marland Kitchen tiotropium (SPIRIVA HANDIHALER) 18 MCG inhalation capsule Place 18 mcg into inhaler and inhale daily.   . TRADJENTA 5 MG TABS tablet Take 5 mg by mouth daily.    Review of Systems      Review of Systems  Constitution: Negative for chills, fever  and malaise/fatigue.  Cardiovascular: Positive for dyspnea on exertion. Negative for chest pain, leg swelling, near-syncope, orthopnea, palpitations and syncope.  Respiratory: Negative for cough, shortness of breath and wheezing.   Gastrointestinal: Negative for nausea and vomiting.  Neurological: Negative for dizziness, light-headedness and weakness.   All other systems reviewed and are otherwise negative except as noted above.  Physical Exam    VS:  BP (!) 156/74 (BP Location: Left Arm, Patient Position: Sitting, Cuff Size: Normal)   Pulse 73   Ht 5' 4.5" (1.638 m)   Wt 235 lb 6 oz (106.8 kg)   SpO2 98%   BMI 39.78 kg/m  , BMI Body mass index is 39.78 kg/m. GEN: Well nourished,  overweight, well developed, in no acute distress. HEENT: normal. Neck: Supple, no JVD, carotid bruits, or masses. Cardiac: RRR, no murmurs, rubs, or gallops. No clubbing, cyanosis, edema.  Radials/DP/PT 2+ and equal bilaterally.  Respiratory:  Respirations regular and unlabored, clear to auscultation bilaterally. GI: Soft, nontender, nondistended, BS + x 4. MS: No deformity or atrophy. Skin: Warm and dry, no rash.  Right radial cath site clean, dry, intact with no ecchymosis, erythema or signs of infection. Neuro:  Strength and sensation are intact. Psych: Normal affect  Assessment & Plan    1. CAD - Cardiac cath 05/23/19 with mild-moderate nonobstructive CAD.  Reports continued dyspnea on exertion but no chest pain since discharge.  Right radial cath site healing appropriately.  Start Imdur 30 mg daily.  Present GDMT includes aspirin, beta-blocker-no statin in the setting of liver dysfunction.  Encourage participation in cardiac rehab which she politely declines.  2. HLD, LDL goal <70 -most recent LDL of 144 - LDL goal <70 She was previously on atorvastatin but declines to resume as she is under the impression that atorvastatin caused her mother's cirrhosis.  She does have elevated liver enzymes and is following with GI for potential liver biopsy.  Will route note to Dr. Vicente Males of GI to get input on best method of lipid control.  If unable to utilize statin, Nexlizet, Zetia may ultimately need to be on PCSK9i. Recommend lipid-lowering diet.  3. Diastolic dysfunction -euvolemic on exam today.  Continue Lasix 20 mg daily.  Continue ACE.  4. Lower extremity edema -recent cardiac cath with evidence of diastolic dysfunction.  Reports this is well controlled on her Lasix 20 mg daily.  Endorses eating a high sodium diet, encouraged to reduce sodium intake.  Discussed elevating lower extremities when sitting and compression stockings.  5. Elevated liver enzymes/?Cirrhosis - Following with GI regarding  possible cirrhosis.  Recommended for liver biopsy but is presently considering this.  Will need to consider when treating lipids, as above.  6. Kidney cyst -noted on MRI of the abdomen.  Has appointment with nephrology tomorrow.  7. DOE -likely multifactorial obesity, deconditioning, diastolic dysfunction, coronary disease, COPD.  Regular cardiovascular exercise and weight loss encouraged.  Disposition: Follow up in 1 month(s) with Dr. Saunders Revel or APP.    Loel Dubonnet, NP 05/31/2019, 4:37 PM

## 2019-05-31 NOTE — Telephone Encounter (Signed)
Sent referral 

## 2019-05-31 NOTE — Addendum Note (Signed)
Addended by: Ulyess Blossom L on: 05/31/2019 01:21 PM   Modules accepted: Orders

## 2019-05-31 NOTE — Patient Instructions (Addendum)
Medication Instructions:  Your physician has recommended you make the following change in your medication:   START Imdur 30mg  daily  We will talk to Dr. Vicente Males about which cholesterol medication to start to protect your liver function.   *If you need a refill on your cardiac medications before your next appointment, please call your pharmacy*  Lab Work: No lab work today.   If you have labs (blood work) drawn today and your tests are completely normal, you will receive your results only by: Marland Kitchen MyChart Message (if you have MyChart) OR . A paper copy in the mail If you have any lab test that is abnormal or we need to change your treatment, we will call you to review the results.  Testing/Procedures: Your EKG today shows normal sinus rhythm.   Your cardiac catheterization shows non-obstructive heart disease and that your heart does not relax as well as it used to.   Follow-Up: At [facility], you and your health needs are our priority.  As part of our continuing mission to provide you with exceptional heart care, we have created designated Provider Care Teams.  These Care Teams include your primary Cardiologist (physician) and Advanced Practice Providers (APPs -  Physician Assistants and Nurse Practitioners) who all work together to provide you with the care you need, when you need it.  We recommend signing up for the patient portal called "MyChart".  Sign up information is provided on this After Visit Summary.  MyChart is used to connect with patients for Virtual Visits (Telemedicine).  Patients are able to view lab/test results, encounter notes, upcoming appointments, etc.  Non-urgent messages can be sent to your provider as well.   To learn more about what you can do with MyChart, go to [email or web address].    Your next appointment:   1 month(s)  The format for your next appointment:   In Person  Provider:   You may see Nelva Bush, MD or one of the following Advanced  Practice Providers on your designated Care Team:    Murray Hodgkins, NP  Christell Faith, PA-C  Marrianne Mood, PA-C  Laurann Montana, NP  Other Instructions Coronary Artery Disease, Female Coronary artery disease (CAD) is a condition in which the arteries that lead to the heart (coronary arteries) become narrow or blocked. The narrowing or blockage can lead to decreased blood flow to the heart. Prolonged reduced blood flow can cause a heart attack (myocardial infarction or MI). This condition may also be called coronary heart disease. Because CAD is the leading cause of death in women, it is important to understand what causes this condition and how it is treated. What are the causes? CAD is most often caused by atherosclerosis. This is the buildup of fat and cholesterol (plaque) on the inside of the arteries. Over time, the plaque may narrow or block the artery, reducing blood flow to the heart. Plaque can also become weak and break off within a coronary artery and cause a sudden blockage. Other less common causes of CAD include:  A blood clot or a piece of a blood clot or other substance that blocks the flow of blood in a coronary artery (embolism).  A tearing of the artery (spontaneous coronary artery dissection).  An enlargement of an artery (aneurysm).  Inflammation (vasculitis) in the artery wall. What increases the risk? The following factors may make you more likely to develop this condition:  Age. Women over age 80 are at a greater risk of CAD.  Family  history of CAD.  High blood pressure (hypertension).  Diabetes.  High cholesterol levels.  Tobacco use.  Lack of exercise.  Menopause. ? All postmenopausal women are at greater risk of CAD. ? Women who have experienced menopause between the ages of 80-45 (early menopause) are at a higher risk of CAD. ? Women who have experienced menopause before age 54 (premature menopause) are at a very high risk of CAD.  Excessive  alcohol use.  A diet high in saturated and trans fats, such as fried food and processed meat. Other possible risk factors include:  High stress levels.  Depression.  Obesity.  Sleep apnea. What are the signs or symptoms? Many people do not have any symptoms during the early stages of CAD. As the condition progresses, symptoms may include:  Chest pain (angina). The pain can: ? Feel like crushing or squeezing, or like a tightness, pressure, fullness, or heaviness in the chest. ? Last more than a few minutes or can stop and recur. The pain tends to get worse with exercise or stress and to fade with rest.  Pain in the arms, neck, jaw, ear, or back.  Unexplained heartburn or indigestion.  Shortness of breath.  Nausea.  Sudden cold sweats.  Sudden light-headedness.  Fluttering or fast heartbeat (palpitations). Many women have chest discomfort and the other symptoms. However, women often have unusual (atypical) symptoms, such as:  Fatigue.  Vomiting.  Unexplained feelings of nervousness or anxiety.  Unexplained weakness.  Dizziness or fainting. How is this diagnosed? This condition is diagnosed based on:  Your family and medical history.  A physical exam.  Tests, including: ? A test to check the electrical signals in your heart (electrocardiogram). ? Exercise stress test. This looks for signs of blockage when the heart is stressed with exercise, such as running on a treadmill. ? Pharmacologic stress test. This test looks for signs of blockage when the heart is being stressed with a medicine. ? Blood tests. ? Coronary angiogram. This is a procedure to look at the coronary arteries to see if there is any blockage. During this test, a dye is injected into your arteries so they appear on an X-ray. ? Coronary artery CT scan. This CT scan helps detect calcium deposits in your coronary arteries. Calcium deposits are an indicator of CAD. ? A test that uses sound waves to  take a picture of your heart (echocardiogram). ? Chest X-ray. How is this treated? This condition may be treated by:  Healthy lifestyle changes to reduce risk factors.  Medicines such as: ? Antiplatelet medicines and blood-thinning medicines, such as aspirin. These help to prevent blood clots. ? Nitroglycerin. ? Blood pressure medicines. ? Cholesterol-lowering medicine.  Coronary angioplasty and stenting. During this procedure, a thin, flexible tube is inserted through a blood vessel and into a blocked artery. A balloon or similar device on the end of the tube is inflated to open up the artery. In some cases, a small, mesh tube (stent) is inserted into the artery to keep it open.  Coronary artery bypass surgery. During this surgery, veins or arteries from other parts of the body are used to create a bypass around the blockage and allow blood to reach your heart. Follow these instructions at home: Medicines  Take over-the-counter and prescription medicines only as told by your health care provider.  Do not take the following medicines unless your health care provider approves: ? NSAIDs, such as ibuprofen, naproxen, or celecoxib. ? Vitamin supplements that contain vitamin A,  vitamin E, or both. ? Hormone replacement therapy that contains estrogen with or without progestin. Lifestyle  Follow an exercise program approved by your health care provider. Aim for 150 minutes of moderate exercise or 75 minutes of vigorous exercise each week.  Maintain a healthy weight or lose weight as approved by your health care provider.  Learn to manage stress or try to limit your stress. Ask your health care provider for suggestions if you need help.  Get screened for depression and seek treatment, if needed.  Do not use any products that contain nicotine or tobacco, such as cigarettes, e-cigarettes, and chewing tobacco. If you need help quitting, ask your health care provider.  Do not use illegal  drugs. Eating and drinking   Follow a heart-healthy diet. A dietitian can help educate you about healthy food options and changes. In general, eat plenty of fruits and vegetables, lean meats, and whole grains.  Avoid foods high in: ? Sugar. ? Salt (sodium). ? Saturated fats, such as processed or fatty meat. ? Trans fats, such as fried food.  Use healthy cooking methods such as roasting, grilling, broiling, baking, poaching, steaming, or stir-frying.  Do not drink alcohol if: ? Your health care provider tells you not to drink. ? You are pregnant, may be pregnant, or are planning to become pregnant.  If you drink alcohol: ? Limit how much you have to 0-1 drink a day. ? Be aware of how much alcohol is in your drink. In the [place] one drink equals one 12 oz bottle of beer (355 mL), one 5 oz glass of wine (148 mL), or one 1 oz glass of hard liquor (44 mL). General instructions  Manage any other health conditions, such as hypertension and diabetes. These conditions affect your heart.  Your health care provider may ask you to monitor your blood pressure. Ideally, your blood pressure should be below 130/80.  Keep all follow-up visits as told by your health care provider. This is important. Get help right away if:  You have pain in your chest, neck, ear, arm, jaw, stomach, or back that: ? Lasts more than a few minutes. ? Is recurring. ? Is not relieved by taking medicine under your tongue (sublingual nitroglycerin).  You have profuse sweating without cause.  You have unexplained: ? Heartburn or indigestion. ? Shortness of breath or difficulty breathing. ? Fluttering or fast heartbeat (palpitations). ? Nausea or vomiting. ? Fatigue. ? Feelings of nervousness or anxiety. ? Weakness. ? Diarrhea.  You have sudden light-headedness or dizziness.  You faint.  You feel like hurting yourself or think about taking your own life. These symptoms may represent a serious problem that is  an emergency. Do not wait to see if the symptoms will go away. Get medical help right away. Call your local emergency services (911 in the [place] Do not drive yourself to the hospital. Summary  Coronary artery disease (CAD) is a condition in which the arteries that lead to the heart (coronary arteries) become narrow or blocked. The narrowing or blockage can lead to a heart attack.  Many women have chest discomfort and other common symptoms of CAD. However, women often have unusual (atypical) symptoms, such as fatigue, vomiting, weakness, or dizziness.  CAD can be treated with lifestyle changes, medicines, surgery, or a combination of these treatments. This information is not intended to replace advice given to you by your health care provider. Make sure you discuss any questions you have with your health care provider. Document  Revised: 09/17/2017 Document Reviewed: 09/07/2017 Elsevier Patient Education  [place].

## 2019-05-31 NOTE — Progress Notes (Signed)
06/01/19 8:36 PM   Humberto Seals 04-23-1955 [id number]  Referring provider: [name], MD [place],  [place] [address] Chief Complaint  Patient presents with  . Other    Renal cyst    HPI: DAIRYN EKLOF is a 64 y.o. F who presents today for the evaluation and management of complex cyst.   -MR Abdomen 05/11/19 incidentally noted complex cyst in left kidney  -2 cm hemorrhagic cyst left upper pole with a small, solid appearing 4 mm peripheral nodule with indeterminate enhancement -History recurrent UTI -Denies gross hematuria or urinary problems  PMH: Past Medical History:  Diagnosis Date  . Anxiety   . COPD (chronic obstructive pulmonary disease) ([place])   . Depression   . Essential hypertension   . Hyperlipidemia   . Hypertension   . Stroke ([place])   . 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 BILATERAL CARPAL TUNNEL RELEASE    . COLONOSCOPY    . COLONOSCOPY WITH PROPOFOL N/A 03/15/2017   Procedure: COLONOSCOPY WITH PROPOFOL;  Surgeon: [name], MD;  Location: [facility] ENDOSCOPY;  Service: Gastroenterology;  Laterality: N/A;  . COLONOSCOPY WITH PROPOFOL N/A 03/29/2018   Procedure: COLONOSCOPY WITH PROPOFOL;  Surgeon: [name], MD;  Location: [facility] ENDOSCOPY;  Service: Gastroenterology;  Laterality: N/A;  . COLONOSCOPY WITH PROPOFOL N/A 05/23/2019   Procedure: COLONOSCOPY WITH PROPOFOL;  Surgeon: [name], MD;  Location: [facility] ENDOSCOPY;  Service: Gastroenterology;  Laterality: N/A;  . ECTOPIC PREGNANCY SURGERY    . ESOPHAGOGASTRODUODENOSCOPY (EGD) WITH PROPOFOL N/A 05/23/2019   Procedure: ESOPHAGOGASTRODUODENOSCOPY (EGD) WITH PROPOFOL;  Surgeon: [name], MD;  Location: [facility] ENDOSCOPY;  Service: Gastroenterology;  Laterality: N/A;  . LEFT HEART CATH AND CORONARY ANGIOGRAPHY Left 05/02/2019   Procedure: LEFT HEART CATH AND CORONARY ANGIOGRAPHY;   Surgeon: Nelva Bush, MD;  Location: [place] CV LAB;  Service: Cardiovascular;  Laterality: Left;    Home Medications:  Allergies as of 06/01/2019   No Known Allergies     Medication List       Accurate as of Jun 01, 2019 11:59 PM. If you have any questions, ask your nurse or doctor.        aspirin 81 MG tablet Take 81 mg by mouth daily.   enalapril 10 MG tablet Commonly known as: Vasotec Take 1 tablet (10 mg total) by mouth daily.   esomeprazole 40 MG capsule Commonly known as: NEXIUM Take 40 mg by mouth daily at 12 noon.   Flovent HFA 220 MCG/ACT inhaler Generic drug: fluticasone Inhale 2 puffs into the lungs daily as needed (shortness of breath).   furosemide 20 MG tablet Commonly known as: LASIX Take 20 mg by mouth daily.   isosorbide mononitrate 30 MG 24 hr tablet Commonly known as: IMDUR Take 1 tablet (30 mg total) by mouth daily.   levothyroxine 100 MCG tablet Commonly known as: SYNTHROID Take 100 mcg by mouth daily.   metoprolol tartrate 25 MG tablet Commonly known as: LOPRESSOR Take 1 tablet (25 mg total) by mouth 2 (two) times daily.   nitroGLYCERIN 0.4 MG SL tablet Commonly known as: NITROSTAT Place 1 tablet (0.4 mg total) under the tongue every 5 (five) minutes as needed for chest pain.   oxybutynin 10 MG 24 hr tablet Commonly known as: DITROPAN-XL Take 10 mg by mouth daily.   ProAir HFA 108 (90 Base) MCG/ACT inhaler Generic drug: albuterol  Inhale 2 puffs into the lungs every 4 (four) hours as needed for wheezing or shortness of breath.   Spiriva HandiHaler 18 MCG inhalation capsule Generic drug: tiotropium Place 18 mcg into inhaler and inhale daily.   Tradjenta 5 MG Tabs tablet Generic drug: linagliptin Take 5 mg by mouth daily.       Allergies: No Known Allergies  Family History: Family History  Problem Relation Age of Onset  . Hypertension Mother   . Hyperlipidemia Mother   . Coronary artery disease Father 20        3-vessel CABG  . Breast cancer Cousin   . Heart attack Sister 33  . Heart attack Brother 71    Social History:  reports that she quit smoking about 4 years ago. Her smoking use included cigarettes. She has a 18.50 pack-year smoking history. She has never used smokeless tobacco. She reports current alcohol use of about 1.0 standard drinks of alcohol per week. She reports that she does not use drugs.   Physical Exam: BP 128/74   Pulse 61   Ht 5\' 4"  (1.626 m)   Wt 234 lb (106.1 kg)   BMI 40.17 kg/m   Constitutional:  Alert and oriented, No acute distress. HEENT: [place] AT, moist mucus membranes.  Trachea midline, no masses. Cardiovascular: No clubbing, cyanosis, or edema. Respiratory: Normal respiratory effort, no increased work of breathing Skin: No rashes, bruises or suspicious lesions. Neurologic: Grossly intact, no focal deficits, moving all 4 extremities. Psychiatric: Normal mood and affect.  Laboratory Data:  Lab Results  Component Value Date   CREATININE 0.74 04/28/2019   Pertinent Imaging: Images personally reviewed  CLINICAL DATA:  Recent abnormal ultrasound. Complex left kidney cyst.  EXAM: MRI ABDOMEN WITHOUT AND WITH CONTRAST  TECHNIQUE: Multiplanar multisequence MR imaging of the abdomen was performed both before and after the administration of intravenous contrast.  CONTRAST:  52mL GADAVIST GADOBUTROL 1 MMOL/ML IV SOLN  COMPARISON:  04/13/2019  FINDINGS: Lower chest: No acute findings.  Hepatobiliary: The contour the liver is diffusely irregular compatible with cirrhosis. Relative hypertrophy of the lateral segment of left lobe of liver and caudate lobe noted. No hyperenhancing liver lesions.  Pancreas: No mass, inflammatory changes, or other parenchymal abnormality identified.  Spleen:  Within normal limits in size and appearance.  Adrenals/Urinary Tract: The adrenal glands are unremarkable. The cyst arising from upper pole of left kidney  measures 2.0 cm, image 17/6. This exhibits increased T1 and T2 signal compatible with a hemorrhagic cyst. There is a small, solid-appearing peripheral nodule within this lesion measuring 3.5 mm. Enhancement characteristics of this nodule are difficult to assess due to its small size and motion artifact. No additional focal kidney lesions.  Stomach/Bowel: Visualized portions within the abdomen are unremarkable.  Vascular/Lymphatic: No abdominal aortic aneurysm demonstrated. Recanalization of the umbilical vein noted. The esophageal and gastric varices noted. Prominent retroperitoneal lymph nodes are identified which are nonspecific in the setting of cirrhosis. For example, there is a portacaval node with a short axis of 1.3 cm, image 32/6. Aortocaval lymph node measures 8 mm, image 52/6.  Other:  No ascites or focal fluid collections.  Musculoskeletal: No suspicious bone lesions identified.  IMPRESSION: 1. Morphologic features of liver compatible with cirrhosis. No hyperenhancing liver lesions identified. 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 2. Complex, hemorrhagic cyst arises from upper pole of left kidney. This contains a small peripheral mural nodule measuring 4 mm. Enhancement characteristics of this nodule or difficult to  categorize due to small size of nodule and motion artifact. Cannot rule out small cystic neoplasm of the kidney. Advised urologic consultation for management   Electronically Signed   By: Kerby Moors M.D.   On: 05/11/2019 10:20  Assessment & Plan:    1.  Complex renal cyst  - MRI findings were discussed in detail  - A solid renal mass raises the suspicion of primary renal malignancy.  We discussed this in detail and in regards to the spectrum of renal masses which includes cysts (pure cysts are considered benign), solid masses and everything in between. The risk of metastasis increases as the size  of solid renal mass increases. In general, it is believed that the risk of metastasis for renal masses less than 3-4 cm is small (up to approximately 5%) based mainly on large retrospective studies. In some cases and especially in patients of older age and multiple comorbidities a surveillance approach may be appropriate. The treatment of solid renal masses includes: surveillance, cryoablation (percutaneous and laparoscopic) in addition to partial and complete nephrectomy (each with option of laparoscopic, robotic and open depending on appropriateness). Furthermore, nephrectomy appears to be an independent risk factor for the development of chronic kidney disease suggesting that nephron sparing approaches should be implored whenever feasible. We reviewed these options in context of the patients current situation as well as the pros and cons of each. For cystic renal masses, we reviewed the Bosniak classification and discussed that Bosniak 3 lesions harbor a 50% chance of malignancy whereas Bosniak 4 cysts have a solid and 90-95% are malignant in nature.   - Pt elected active surveillance for now   - Return for CT in 3 months for follow-up imaging  [facility] [address], [place], [place] [address] [phone]  I, Lucas Mallow, am acting as a scribe for Dr. Nicki Reaper C. Abid Bolla,  I have reviewed the above documentation for accuracy and completeness, and I agree with the above.   Abbie Sons, MD

## 2019-05-31 NOTE — Telephone Encounter (Signed)
Called and left a message for call back  

## 2019-05-31 NOTE — Telephone Encounter (Signed)
-----   Message from [name], MD sent at 05/25/2019 10:14 AM EDT ----- Multiple polyps taken out adenomas- this is third colonoscopy in 3 years and has had over 10-15 polyps resected- refer for genetic testing and repeat colonoscopy in 1 year

## 2019-06-01 ENCOUNTER — Ambulatory Visit (INDEPENDENT_AMBULATORY_CARE_PROVIDER_SITE_OTHER): Payer: Medicare Other | Admitting: Urology

## 2019-06-01 VITALS — BP 128/74 | HR 61 | Ht 64.0 in | Wt 234.0 lb

## 2019-06-01 DIAGNOSIS — N2889 Other specified disorders of kidney and ureter: Secondary | ICD-10-CM

## 2019-06-01 NOTE — Telephone Encounter (Signed)
Patient verbalized understanding and will make appointment when they call

## 2019-06-02 IMAGING — MG MM DIGITAL SCREENING BILAT W/ CAD
5 series · 5 of 5 positions shown · non-contrast
Comparison: Previous exam(s).

CLINICAL DATA: Screening.

EXAM:
DIGITAL SCREENING BILATERAL MAMMOGRAM WITH CAD

[R MLO]
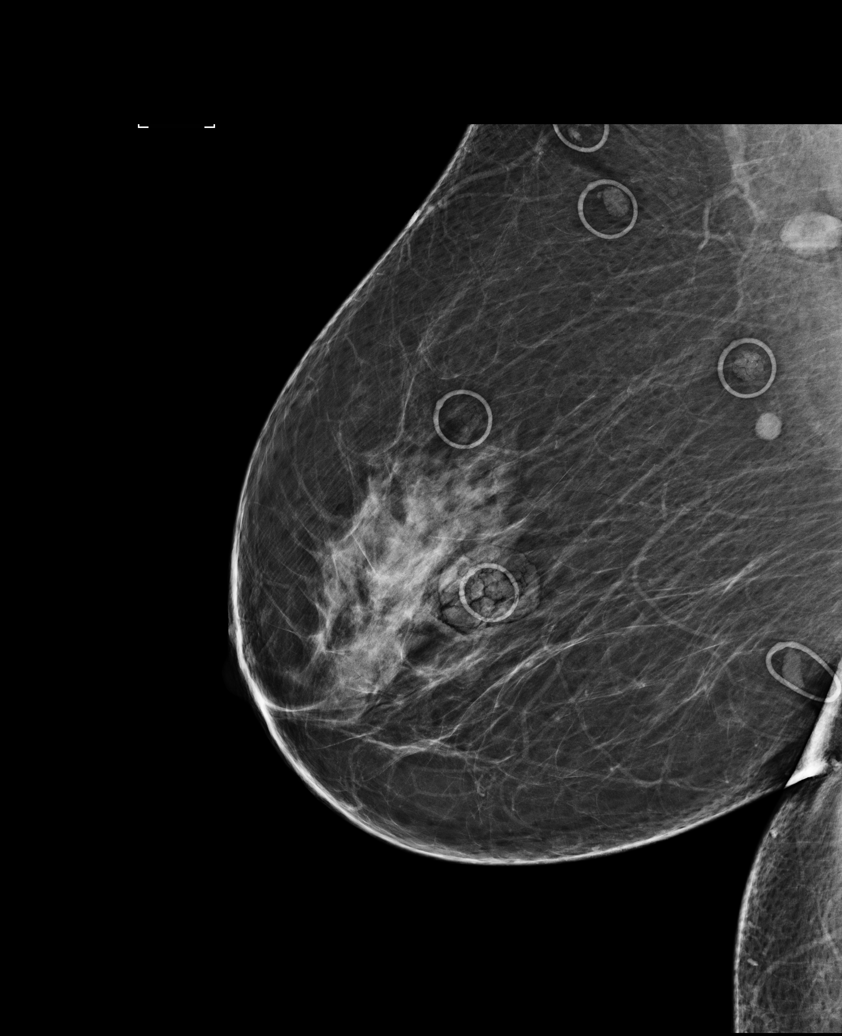

[L MLO (1 of 2)]
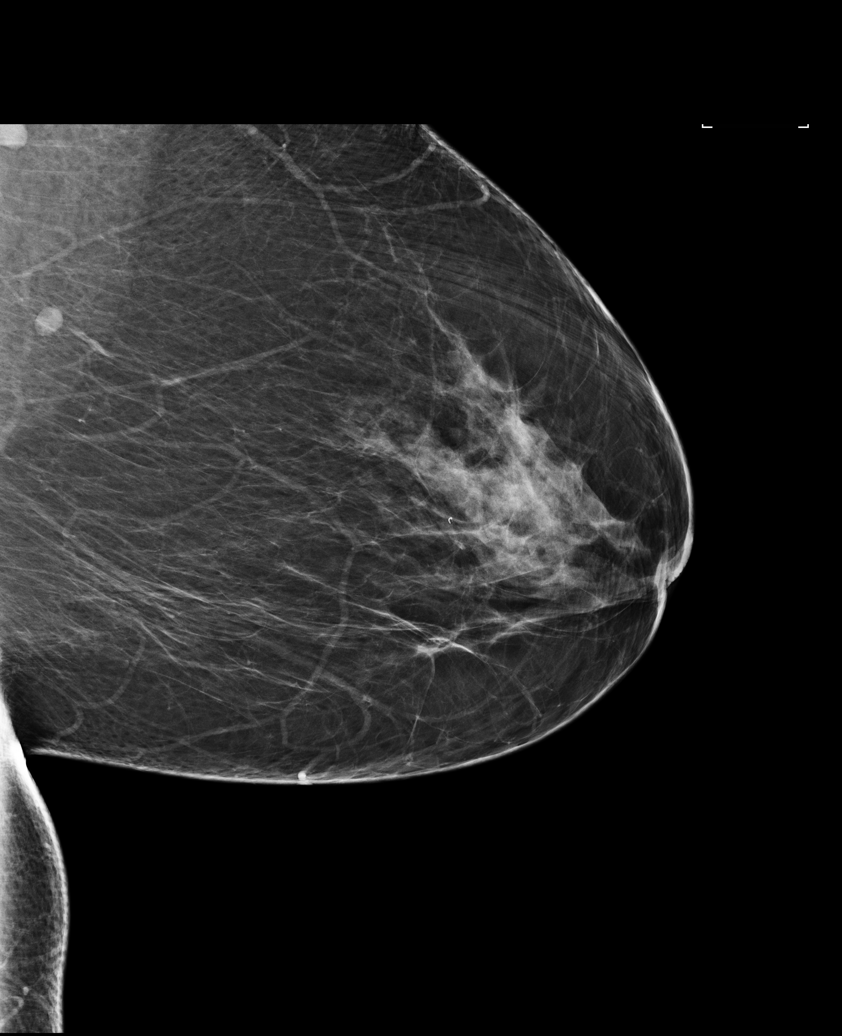

[R CC]
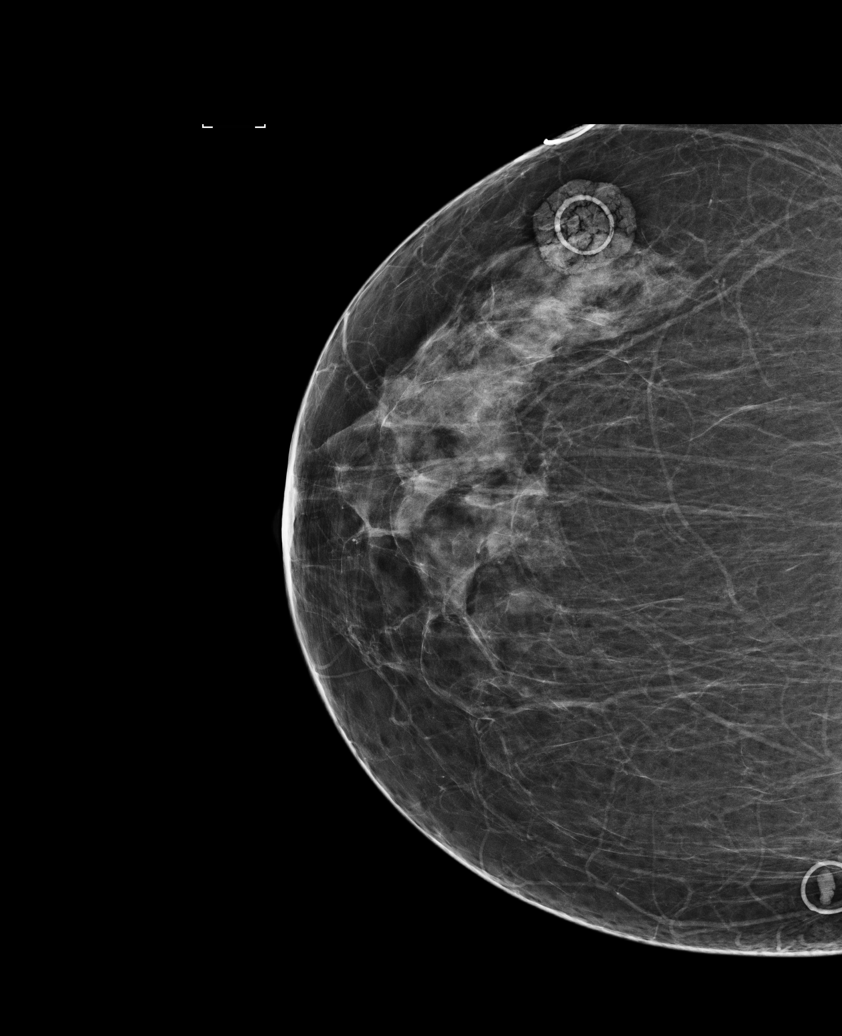

[L MLO (2 of 2)]
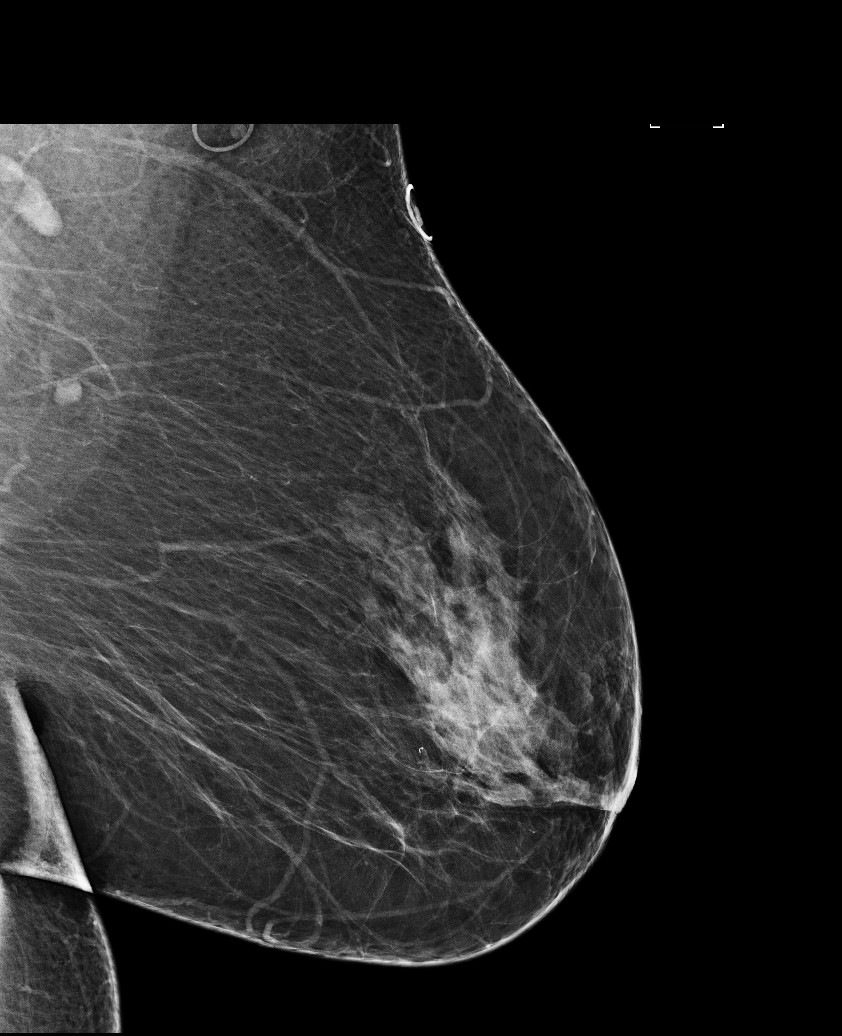

[L CC]
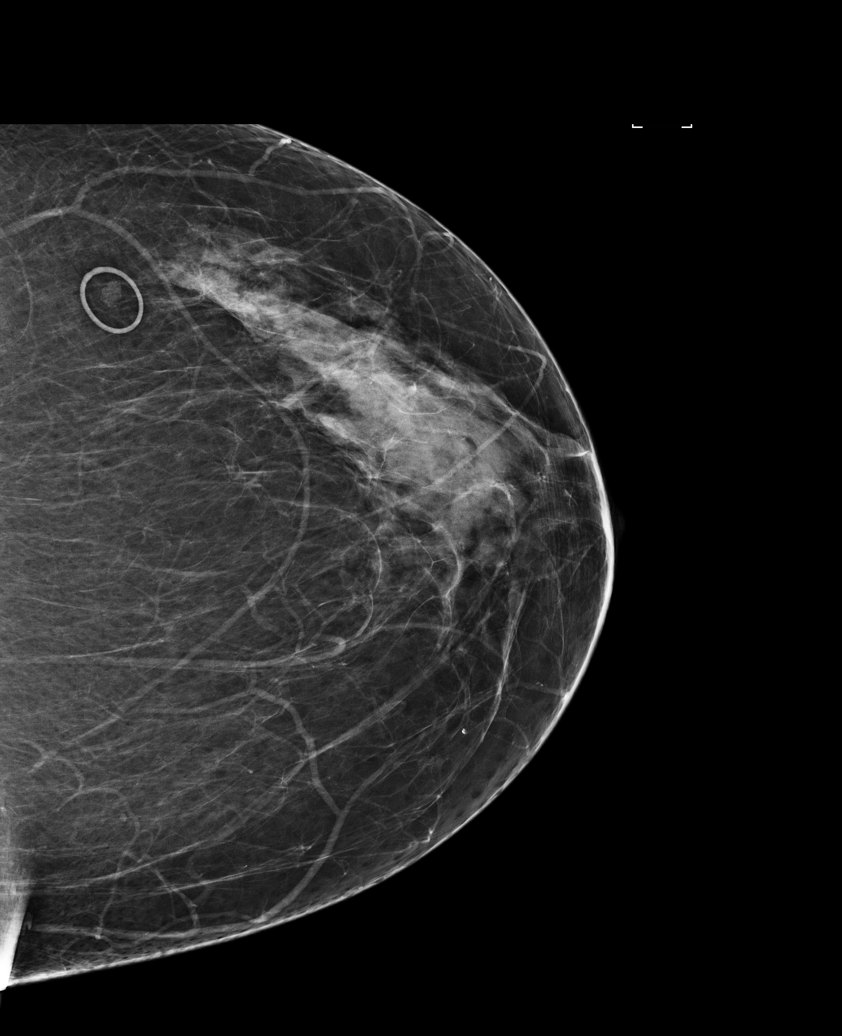

[5 of 5 positions shown; findings below may reference images not displayed]

ACR Breast Density Category b: There are scattered areas of
fibroglandular density.
FINDINGS: There are no findings suspicious for malignancy. Images were
processed with CAD.
IMPRESSION: No mammographic evidence of malignancy. A result letter of this
screening mammogram will be mailed directly to the patient.

RECOMMENDATION:
Screening mammogram in one year. (Code:AS-G-LCT)

BI-RADS CATEGORY  1: Negative.

## 2019-06-04 ENCOUNTER — Encounter: Payer: Self-pay | Admitting: Urology

## 2019-06-15 ENCOUNTER — Inpatient Hospital Stay: Payer: Medicare Other

## 2019-06-15 ENCOUNTER — Inpatient Hospital Stay: Payer: Medicare Other | Admitting: Licensed Clinical Social Worker

## 2019-06-21 ENCOUNTER — Telehealth (INDEPENDENT_AMBULATORY_CARE_PROVIDER_SITE_OTHER): Payer: Medicare Other | Admitting: Gastroenterology

## 2019-06-21 DIAGNOSIS — I8510 Secondary esophageal varices without bleeding: Secondary | ICD-10-CM | POA: Diagnosis not present

## 2019-06-21 DIAGNOSIS — Z860100 Personal history of colon polyps, unspecified: Secondary | ICD-10-CM

## 2019-06-21 DIAGNOSIS — I25118 Atherosclerotic heart disease of native coronary artery with other forms of angina pectoris: Secondary | ICD-10-CM

## 2019-06-21 NOTE — Progress Notes (Signed)
Jennifer Mosley , MD [address]  [place]  [place], [place] [address]  Main: [phone]  Fax: [phone]   Primary Care Physician: [name], MD  Virtual Visit via Telephone Note  I connected with patient on 06/21/19 at 10:00 AM EDT by telephone and verified that I am speaking with the correct person using two identifiers.   I discussed the limitations, risks, security and privacy concerns of performing an evaluation and management service by telephone and the availability of in person appointments. I also discussed with the patient that there may be a patient responsible charge related to this service. The patient expressed understanding and agreed to proceed.  Location of Patient: Home Location of Provider: Home Persons involved: Patient and provider only   History of Present Illness: Chief Complaint  Patient presents with  . Follow-up    HPI: Jennifer Mosley is a 64 y.o. female   Summary of history :  She was initially referred and seen on 04/26/2019 for abdominal pain.  At her initial visit she had woken up a few weeks prior all of a sudden in the night and had felt deep pressure over the center of her chest.She had been on Nexium at that point of time which she has been taking for many years for reflux but this is something different. It was nonradiating. She has 2 sisters 65 and 52 of age who have coronary artery disease and have had heart attacks.  She also complained of nonspecific generalized abdominal discomfort on and off for a long time, nonradiating, no aggravating or relieving factors. Denies any NSAID use.  03/15/2017 colonoscopy: multiple polyps were resected in the sigmoid transverse rectum and ascending colon majority of which were a combination of tubular adenomas and hyperplastic polyps. 03/29/2018 colonoscopy 3 sessile serrated polyps resected. Plan was to repeat colonoscopy in 1 years time due to multiple polyps seen on multiple  occasions.  04/13/2019 ultrasound abdomen shows slightly nodular hepatic contour suggesting cirrhosis. Complex nodule at the upper left kidney MR without and with contrast recommended. 05/11/2019: MRI of the abdomen: Features suggestive of cirrhosis.  No enhancing lesions.  Features of portal hypertension including esophageal and gastric varices noted.  Complex hemorrhagic cyst arising from the upper pole of the kidney.  Cannot rule out small cystic neoplasm. 05/02/2019: Underwent cardiac catheterization. 04/26/2019: Smooth muscle antibody is positive.  Antigliadin antibody positive.  Rest of work-up for viral and autoimmune hepatitis was negative.Immune to hepatitis A, not immune to hepatitis B   Interval history 05/16/2019-06/21/2019  05/23/2019: EGD grade 1 esophageal varices found in the lower third of the esophagus not banded mild pOrtal hypertension gastropathy found in the stomach.  Colonoscopy 5 sessile polyps in the sigmoid and transverse colon.  Sessile serrated polyps.  Seen by urology on 06/01/2019 for the abnormal MRI and elective surveillance for now.  She has an appointment to see genetics tomorrow.    Current Outpatient Medications  Medication Sig Dispense Refill  . albuterol (PROAIR HFA) 108 (90 Base) MCG/ACT inhaler Inhale 2 puffs into the lungs every 4 (four) hours as needed for wheezing or shortness of breath.     Marland Kitchen aspirin 81 MG tablet Take 81 mg by mouth daily.    . enalapril (VASOTEC) 10 MG tablet Take 1 tablet (10 mg total) by mouth daily. 90 tablet 3  . esomeprazole (NEXIUM) 40 MG capsule Take 40 mg by mouth daily at 12 noon.    Marland Kitchen FLOVENT HFA 220 MCG/ACT  inhaler Inhale 2 puffs into the lungs daily as needed (shortness of breath).     . furosemide (LASIX) 20 MG tablet Take 20 mg by mouth daily.    . isosorbide mononitrate (IMDUR) 30 MG 24 hr tablet Take 1 tablet (30 mg total) by mouth daily. 30 tablet 2  . levothyroxine (SYNTHROID) 100 MCG tablet Take 100 mcg by mouth  daily.    Marland Kitchen levothyroxine (SYNTHROID) 88 MCG tablet Take 88 mcg by mouth daily.    . metoprolol tartrate (LOPRESSOR) 25 MG tablet Take 1 tablet (25 mg total) by mouth 2 (two) times daily. 60 tablet 5  . nitroGLYCERIN (NITROSTAT) 0.4 MG SL tablet Place 1 tablet (0.4 mg total) under the tongue every 5 (five) minutes as needed for chest pain. 25 tablet 3  . oxybutynin (DITROPAN-XL) 10 MG 24 hr tablet Take 10 mg by mouth daily.    Marland Kitchen tiotropium (SPIRIVA HANDIHALER) 18 MCG inhalation capsule Place 18 mcg into inhaler and inhale daily.     . TRADJENTA 5 MG TABS tablet Take 5 mg by mouth daily.     No current facility-administered medications for this visit.    Allergies as of 06/21/2019  . (No Known Allergies)    Review of Systems:    All systems reviewed and negative except where noted in HPI.   Observations/Objective:  Labs: CMP     Component Value Date/Time   NA 141 04/28/2019 1421   NA 141 08/14/2013 1806   K 4.6 04/28/2019 1421   K 4.2 08/14/2013 1806   CL 103 04/28/2019 1421   CL 109 (H) 08/14/2013 1806   CO2 25 04/28/2019 1421   CO2 28 08/14/2013 1806   GLUCOSE 211 (H) 04/28/2019 1421   GLUCOSE 214 (H) 03/21/2017 1501   GLUCOSE 120 (H) 08/14/2013 1806   BUN 9 04/28/2019 1421   BUN 11 08/14/2013 1806   CREATININE 0.74 04/28/2019 1421   CREATININE 1.01 08/14/2013 1806   CALCIUM 9.3 04/28/2019 1421   CALCIUM 8.5 08/14/2013 1806   PROT 6.3 (L) 05/02/2019 0952   ALBUMIN 2.7 (L) 05/02/2019 0952   AST 54 (H) 05/02/2019 0952   ALT 34 05/02/2019 0952   ALKPHOS 191 (H) 05/02/2019 0952   BILITOT 0.9 05/02/2019 0952   GFRNONAA 87 04/28/2019 1421   GFRNONAA >60 08/14/2013 1806   GFRAA 100 04/28/2019 1421   GFRAA >60 08/14/2013 1806   Lab Results  Component Value Date   WBC 4.3 04/28/2019   HGB 14.6 04/28/2019   HCT 42.2 04/28/2019   MCV 91 04/28/2019   PLT 117 (L) 04/28/2019    Imaging Studies: No results found.  Assessment and Plan:   Jennifer Mosley is a 64  y.o. y/o female here to follow-up for abdominal pain, cirrhosis of the liver.  Initial work-up suggested positive smooth muscle antibody and antigliadin antibody that is positive.  Need further evaluation.  Immune to hepatitis A but not immune hepatitis B.  History of multiple colonic polyps.     Plan 1. Surveillance colonoscopy due to history of sessile polyps, await genetic testing. 2.  . Nodular appearance of the liver concerning for cirrhosis.  With positive smooth muscle antibody would suggest that we obtain a liver biopsy to determine if she does have autoimmune hepatitis.  If present may need immunosuppression.  Would suggest transjugular route.  Long discussion about the risks versus benefits .  She has decided at this time not to pursue liver biopsy. 3.  We will get  in touch with Dr. Lennox Grumbles to see if he can change her metoprolol to either propranolol or nadolol which will also help provide prophylaxis from variceal bleeding.  Follow-up in 3 to 4 months     I discussed the assessment and treatment plan with the patient. The patient was provided an opportunity to ask questions and all were answered. The patient agreed with the plan and demonstrated an understanding of the instructions.   The patient was advised to call back or seek an in-person evaluation if the symptoms worsen or if the condition fails to improve as anticipated.  I provided 12 minutes of non-face-to-face time during this encounter.  Dr Jennifer Bellows MD,MRCP [facility]) Gastroenterology/Hepatology Pager: [phone]   Speech recognition software was used to dictate this note.

## 2019-06-22 ENCOUNTER — Inpatient Hospital Stay: Payer: Medicare Other

## 2019-06-22 ENCOUNTER — Inpatient Hospital Stay: Payer: Medicare Other | Attending: Oncology | Admitting: Licensed Clinical Social Worker

## 2019-06-22 ENCOUNTER — Other Ambulatory Visit: Payer: Self-pay

## 2019-06-22 ENCOUNTER — Encounter: Payer: Self-pay | Admitting: Licensed Clinical Social Worker

## 2019-06-22 DIAGNOSIS — Z8049 Family history of malignant neoplasm of other genital organs: Secondary | ICD-10-CM | POA: Diagnosis not present

## 2019-06-22 DIAGNOSIS — Z860100 Personal history of colon polyps, unspecified: Secondary | ICD-10-CM | POA: Insufficient documentation

## 2019-06-22 DIAGNOSIS — Z800 Family history of malignant neoplasm of digestive organs: Secondary | ICD-10-CM | POA: Diagnosis not present

## 2019-06-22 DIAGNOSIS — Z803 Family history of malignant neoplasm of breast: Secondary | ICD-10-CM | POA: Diagnosis not present

## 2019-06-22 NOTE — Progress Notes (Signed)
REFERRING PROVIDER: [name], MD [address] [place],  [place] [address]  PRIMARY PROVIDER:  [name], MD  PRIMARY REASON FOR VISIT:  1. Family history of colon cancer   2. Family history of breast cancer   3. Family history of cervical cancer   4. Personal history of colonic polyps      HISTORY OF PRESENT ILLNESS:   Jennifer Mosley, a 64 y.o. female, was seen for a  cancer genetics consultation at the request of Dr. Vicente Males due to a personal history of polyps and family history of cancer.  Jennifer Mosley presents to clinic today to discuss the possibility of a hereditary predisposition to cancer, genetic testing, and to further clarify her future cancer risks, as well as potential cancer risks for family members.    Jennifer Mosley is a 64 y.o. female with no personal history of cancer.  She reports having lots of polyps, she believes the total is between 20 and 100. She reports her first colonoscopy was in 2004 and then had one 5 years later, then every 3 years and then every year.    2019 colonoscopy: Multiple polyps (at least 12), combination of tubular adenomas and hyperplastic 2020 colonoscopy: 3 sessile serrated polyps, 3 hyperplastic polyps 2021 colonoscopy: 5 sessile serrated polyps    CANCER HISTORY:  Oncology History   No history exists.     RISK FACTORS:  Menarche was at age 79.  First live birth at age 22.  OCP use for a few years. Ovaries intact: yes.  Hysterectomy: no.  Menopausal status: postmenopausal.  HRT use: 0 years. Colonoscopy: yes; detailed above.  Mammogram within the last year: yes. Number of breast biopsies: 0. Up to date with pelvic exams: yes. Any excessive radiation exposure in the past: no  Past Medical History:  Diagnosis Date  . Anxiety   . COPD (chronic obstructive pulmonary disease) ([place])   . Depression   . Essential hypertension   . Family history of breast cancer   . Family history of cervical cancer   .  Family history of colon cancer   . Hyperlipidemia   . Hypertension   . Personal history of colonic polyps   . Stroke ([facility])   . 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 BILATERAL CARPAL TUNNEL RELEASE    . COLONOSCOPY    . COLONOSCOPY WITH PROPOFOL N/A 03/15/2017   Procedure: COLONOSCOPY WITH PROPOFOL;  Surgeon: [name], MD;  Location: [facility] ENDOSCOPY;  Service: Gastroenterology;  Laterality: N/A;  . COLONOSCOPY WITH PROPOFOL N/A 03/29/2018   Procedure: COLONOSCOPY WITH PROPOFOL;  Surgeon: [name], MD;  Location: [facility] ENDOSCOPY;  Service: Gastroenterology;  Laterality: N/A;  . COLONOSCOPY WITH PROPOFOL N/A 05/23/2019   Procedure: COLONOSCOPY WITH PROPOFOL;  Surgeon: [name], MD;  Location: [facility] ENDOSCOPY;  Service: Gastroenterology;  Laterality: N/A;  . ECTOPIC PREGNANCY SURGERY    . ESOPHAGOGASTRODUODENOSCOPY (EGD) WITH PROPOFOL N/A 05/23/2019   Procedure: ESOPHAGOGASTRODUODENOSCOPY (EGD) WITH PROPOFOL;  Surgeon: [name], MD;  Location: [facility] ENDOSCOPY;  Service: Gastroenterology;  Laterality: N/A;  . LEFT HEART CATH AND CORONARY ANGIOGRAPHY Left 05/02/2019   Procedure: LEFT HEART CATH AND CORONARY ANGIOGRAPHY;  Surgeon: Nelva Bush, MD;  Location: [place] CV LAB;  Service: Cardiovascular;  Laterality: Left;    Social History   Socioeconomic History  . Marital status: Widowed    Spouse name: Not on file  .  Number of children: Not on file  . Years of education: Not on file  . Highest education level: Not on file  Occupational History  . Not on file  Tobacco Use  . Smoking status: Former Smoker    Packs/day: 0.50    Years: 37.00    Pack years: 18.50    Types: Cigarettes    Quit date: 03/16/2015    Years since quitting: 4.2  . Smokeless tobacco: Never Used  Vaping Use  . Vaping Use: Never used  Substance and Sexual Activity  . Alcohol use: Yes    Alcohol/week: 1.0 standard drink     Types: 1 Cans of beer per week  . Drug use: No  . Sexual activity: Not on file  Other Topics Concern  . Not on file  Social History Narrative  . Not on file   Social Determinants of Health   Financial Resource Strain:   . Difficulty of Paying Living Expenses:   Food Insecurity:   . Worried About Charity fundraiser in the Last Year:   . Arboriculturist in the Last Year:   Transportation Needs:   . Film/video editor (Medical):   Marland Kitchen Lack of Transportation (Non-Medical):   Physical Activity:   . Days of Exercise per Week:   . Minutes of Exercise per Session:   Stress:   . Feeling of Stress :   Social Connections:   . Frequency of Communication with Friends and Family:   . Frequency of Social Gatherings with Friends and Family:   . Attends Religious Services:   . Active Member of Clubs or Organizations:   . Attends Archivist Meetings:   Marland Kitchen Marital Status:      FAMILY HISTORY:  We obtained a detailed, 4-generation family history.  Significant diagnoses are listed below: Family History  Problem Relation Age of Onset  . Hypertension Mother   . Hyperlipidemia Mother   . Cervical cancer Mother   . Coronary artery disease Father 52       3-vessel CABG  . Colon cancer Father        dx 44s  . Lung cancer Father        dx 34s  . Breast cancer Cousin   . Heart attack Sister 19  . Heart attack Brother 31  . Cancer Maternal Aunt        unk types  . Cancer Maternal Uncle        unk type  . Cancer Paternal Aunt        unk type  . Skin cancer Sister   . Colon polyps Sister   . Colon cancer Brother        dx 52s   Jennifer Mosley has 2 sons, ages 24 and 31, and she does not believe they have had colonoscopies yet. Her youngest son had a son who had brain cancer/tumor when he was 41, and is currently 66 and doing well. Patient has 3 sisters and 3 brothers. Her brother had colon cancer in his 43s and is living at 38. One of her sisters has had skin cancer and  possibly colon polyps.  Jennifer Mosley father had colon cancer in his 51s, lung cancer in his 37s and died in his 20s. Patient had  2 paternal aunts and 1 paternal uncle. One aunt had cancer but she is unsure type. This aunt's daughter had breast cancer. She does not have information about grandparents.  Jennifer Mosley mother died  at 66. She had history of cervical cancer. Patient had 3 maternal aunts and 1 uncle, all died from cancer but she is unsure of types. Maternal grandmother died at 64, she does not have information about grandfather.   Jennifer Mosley is unaware of previous family history of genetic testing for hereditary cancer risks. Patient's maternal ancestors are of unknown descent, and paternal ancestors are of unknown descent. There is no reported Ashkenazi Jewish ancestry. There is no known consanguinity.  GENETIC COUNSELING ASSESSMENT: Jennifer Mosley is a 64 y.o. female with a personal history of polyps and family history of cancer which is somewhat suggestive of a hereditary cancer syndrome/polyposis syndrome and predisposition to cancer. We, therefore, discussed and recommended the following at today's visit.   DISCUSSION: We discussed that polyps in general are common, however, most people have fewer than 5 lifetime polyps. When an individual has 10 or more polyps we become concerned about an underlying polyposis syndrome. The most common hereditary polyposis syndromes are caused by problems in the APC and MUTYH genes, however, more recently, mutations in the [place] and MSH3 genes have been identified in some polyposis families. We also discussed serrated polyposis briefly as well as Lynch syndrome given the history of colon cancer in the family. We discussed that testing is beneficial for several reasons includingknowing how to follow individuals for cancer screenings, and understand if other family members could be at risk for cancer and allow them to undergo genetic testing.   We  reviewed the characteristics, features and inheritance patterns of hereditary cancer syndromes. We also discussed genetic testing, including the appropriate family members to test, the process of testing, insurance coverage and turn-around-time for results. We discussed the implications of a negative, positive and/or variant of uncertain significant result. We recommended Jennifer Mosley pursue genetic testing for the [facility] Multi-Cancer gene panel.   The Multi-Cancer Panel offered by [facility] includes sequencing and/or deletion duplication testing of the following 85 genes: AIP, ALK, APC, ATM, AXIN2,BAP1,  BARD1, BLM, BMPR1A, BRCA1, BRCA2, BRIP1, CASR, CDC73, CDH1, CDK4, CDKN1B, CDKN1C, CDKN2A (p14ARF), CDKN2A (p16INK4a), CEBPA, CHEK2, CTNNA1, DICER1, DIS3L2, EGFR (c.2369C>T, p.Thr790Met variant only), EPCAM (Deletion/duplication testing only), FH, FLCN, GATA2, GPC3, GREM1 (Promoter region deletion/duplication testing only), HOXB13 (c.251G>A, p.Gly84Glu), HRAS, KIT, MAX, MEN1, MET, MITF (c.952G>A, p.Glu318Lys variant only), MLH1, MSH2, MSH3, MSH6, MUTYH, NBN, NF1, NF2, NTHL1, PALB2, PDGFRA, PHOX2B, PMS2, POLD1, POLE, POT1, PRKAR1A, PTCH1, PTEN, RAD50, RAD51C, RAD51D, RB1, RECQL4, RET, RNF43, RUNX1, SDHAF2, SDHA (sequence changes only), SDHB, SDHC, SDHD, SMAD4, SMARCA4, SMARCB1, SMARCE1, STK11, SUFU, TERC, TERT, TMEM127, TP53, TSC1, TSC2, VHL, WRN and WT1.   Based on Jennifer Mosley's personal history of polyps and family history of cancer, she meets medical criteria for genetic testing. Despite that she meets criteria, she may still have an out of pocket cost. We discussed that if her out of pocket cost for testing is over $100, the laboratory will call and confirm whether she wants to proceed with testing.  If the out of pocket cost of testing is less than $100 she will be billed by the genetic testing laboratory.   PLAN: After considering the risks, benefits, and limitations, Jennifer Mosley provided informed  consent to pursue genetic testing and the blood sample was sent to [facility] for analysis of the Multi-Cancer Panel. Results should be available within approximately 2-3 weeks' time, at which point they will be disclosed by telephone to Jennifer Mosley, as will any additional recommendations warranted by these results. Jennifer Mosley will receive a  summary of her genetic counseling visit and a copy of her results once available. This information will also be available in Epic.   Jennifer Mosley questions were answered to her satisfaction today. Our contact information was provided should additional questions or concerns arise. Thank you for the referral and allowing [place] to share in the care of your patient.   Faith Rogue, MS, [facility] Genetic Counselor [email or web address] Phone: [phone]  The patient was seen for a total of 35 minutes in face-to-face genetic counseling. Patient's friend Arville Go was also present. Dr. Grayland Ormond was available for discussion regarding this case.   _______________________________________________________________________ For Office Staff:  Number of people involved in session: 2 Was an Intern/ student involved with case: no

## 2019-07-03 ENCOUNTER — Ambulatory Visit: Payer: Self-pay | Admitting: Licensed Clinical Social Worker

## 2019-07-03 ENCOUNTER — Encounter: Payer: Self-pay | Admitting: Licensed Clinical Social Worker

## 2019-07-03 ENCOUNTER — Ambulatory Visit (INDEPENDENT_AMBULATORY_CARE_PROVIDER_SITE_OTHER): Payer: Medicare Other | Admitting: Family

## 2019-07-03 ENCOUNTER — Encounter: Payer: Self-pay | Admitting: Family

## 2019-07-03 ENCOUNTER — Other Ambulatory Visit: Payer: Self-pay

## 2019-07-03 ENCOUNTER — Telehealth: Payer: Self-pay | Admitting: Licensed Clinical Social Worker

## 2019-07-03 VITALS — BP 130/86 | HR 55 | Ht 64.5 in | Wt 238.4 lb

## 2019-07-03 DIAGNOSIS — I10 Essential (primary) hypertension: Secondary | ICD-10-CM | POA: Diagnosis not present

## 2019-07-03 DIAGNOSIS — Z7189 Other specified counseling: Secondary | ICD-10-CM | POA: Insufficient documentation

## 2019-07-03 DIAGNOSIS — E785 Hyperlipidemia, unspecified: Secondary | ICD-10-CM | POA: Diagnosis not present

## 2019-07-03 DIAGNOSIS — R748 Abnormal levels of other serum enzymes: Secondary | ICD-10-CM

## 2019-07-03 DIAGNOSIS — Z1379 Encounter for other screening for genetic and chromosomal anomalies: Secondary | ICD-10-CM

## 2019-07-03 DIAGNOSIS — I25118 Atherosclerotic heart disease of native coronary artery with other forms of angina pectoris: Secondary | ICD-10-CM

## 2019-07-03 DIAGNOSIS — Z800 Family history of malignant neoplasm of digestive organs: Secondary | ICD-10-CM

## 2019-07-03 DIAGNOSIS — Z803 Family history of malignant neoplasm of breast: Secondary | ICD-10-CM

## 2019-07-03 DIAGNOSIS — Z860100 Personal history of colon polyps, unspecified: Secondary | ICD-10-CM

## 2019-07-03 DIAGNOSIS — I5189 Other ill-defined heart diseases: Secondary | ICD-10-CM

## 2019-07-03 DIAGNOSIS — Z8049 Family history of malignant neoplasm of other genital organs: Secondary | ICD-10-CM

## 2019-07-03 MED ORDER — FUROSEMIDE 20 MG PO TABS: 20.0000 mg | ORAL_TABLET | ORAL | 3 refills | Status: AC | PRN

## 2019-07-03 MED ORDER — NADOLOL 20 MG PO TABS: 20.0000 mg | ORAL_TABLET | Freq: Every day | ORAL | 3 refills | Status: AC

## 2019-07-03 MED ORDER — ROSUVASTATIN CALCIUM 20 MG PO TABS: 20.0000 mg | ORAL_TABLET | Freq: Every day | ORAL | 2 refills | Status: AC

## 2019-07-03 NOTE — Progress Notes (Signed)
HPI:  Jennifer Mosley was previously seen in the [place] clinic due to a personal history of colon polyps, family history of cancer and concerns regarding a hereditary predisposition to cancer. Please refer to our prior cancer genetics clinic note for more information regarding our discussion, assessment and recommendations, at the time. Jennifer Mosley recent genetic test results were disclosed to her, as were recommendations warranted by these results. These results and recommendations are discussed in more detail below.  Jennifer Mosley is a 64 y.o. female with no personal history of cancer.  She reports having lots of polyps, she believes the total is between 20 and 100. She reports her first colonoscopy was in 2004 and then had one 5 years later, then every 3 years and then every year.    2019 colonoscopy: Multiple polyps (at least 12), combination of tubular adenomas and hyperplastic 2020 colonoscopy: 3 sessile serrated polyps, 3 hyperplastic polyps 2021 colonoscopy: 5 sessile serrated polyps   CANCER HISTORY:  Oncology History   No history exists.    FAMILY HISTORY:  We obtained a detailed, 4-generation family history.  Significant diagnoses are listed below: Family History  Problem Relation Age of Onset  . Hypertension Mother   . Hyperlipidemia Mother   . Cervical cancer Mother   . Coronary artery disease Father 37       3-vessel CABG  . Colon cancer Father        dx 77s  . Lung cancer Father        dx 52s  . Breast cancer Cousin   . Heart attack Sister 52  . Heart attack Brother 89  . Cancer Maternal Aunt        unk types  . Cancer Maternal Uncle        unk type  . Cancer Paternal Aunt        unk type  . Skin cancer Sister   . Colon polyps Sister   . Colon cancer Brother        dx 70s    Jennifer Mosley has 2 sons, ages 47 and 11, and she does not believe they have had colonoscopies yet. Her youngest son had a son who had brain cancer/tumor when he was 54,  and is currently 67 and doing well. Patient has 3 sisters and 3 brothers. Her brother had colon cancer in his 32s and is living at 44. One of her sisters has had skin cancer and possibly colon polyps.  Jennifer Mosley. [place] father had colon cancer in his 52s, lung cancer in his 8s and died in his 30s. Patient had  2 paternal aunts and 1 paternal uncle. One aunt had cancer but she is unsure type. This aunt's daughter had breast cancer. She does not have information about grandparents.  Jennifer Mosley. [place] mother died at 11. She had history of cervical cancer. Patient had 3 maternal aunts and 1 uncle, all died from cancer but she is unsure of types. Maternal grandmother died at 81, she does not have information about grandfather.   Jennifer Mosley. [place] is unaware of previous family history of genetic testing for hereditary cancer risks. Patient's maternal ancestors are of unknown descent, and paternal ancestors are of unknown descent. There is no reported Ashkenazi Jewish ancestry. There is no known consanguinity.  GENETIC TEST RESULTS: Genetic testing reported out on 06/29/2019 through the [facility] Multi- cancer panel found no pathogenic mutations.   The Multi-Cancer Panel offered by [facility] includes sequencing and/or deletion duplication testing of the  following 85 genes: AIP, ALK, APC, ATM, AXIN2,BAP1,  BARD1, BLM, BMPR1A, BRCA1, BRCA2, BRIP1, CASR, CDC73, CDH1, CDK4, CDKN1B, CDKN1C, CDKN2A (p14ARF), CDKN2A (p16INK4a), CEBPA, CHEK2, CTNNA1, DICER1, DIS3L2, EGFR (c.2369C>T, p.Thr790Met variant only), EPCAM (Deletion/duplication testing only), FH, FLCN, GATA2, GPC3, GREM1 (Promoter region deletion/duplication testing only), HOXB13 (c.251G>A, p.Gly84Glu), HRAS, KIT, MAX, MEN1, MET, MITF (c.952G>A, p.Glu318Lys variant only), MLH1, MSH2, MSH3, MSH6, MUTYH, NBN, NF1, NF2, NTHL1, PALB2, PDGFRA, PHOX2B, PMS2, POLD1, POLE, POT1, PRKAR1A, PTCH1, PTEN, RAD50, RAD51C, RAD51D, RB1, RECQL4, RET, RNF43, RUNX1, SDHAF2, SDHA (sequence  changes only), SDHB, SDHC, SDHD, SMAD4, SMARCA4, SMARCB1, SMARCE1, STK11, SUFU, TERC, TERT, TMEM127, TP53, TSC1, TSC2, VHL, WRN and WT1.   The test report has been scanned into EPIC and is located under the Molecular Pathology section of the Results Review tab.  A portion of the result report is included below for reference.     We discussed with Jennifer Mosley that because current genetic testing is not perfect, it is possible there may be a gene mutation in one of these genes that current testing cannot detect, but that chance is small.  We also discussed, that there could be another gene that has not yet been discovered, or that we have not yet tested, that is responsible for her colon polyps/ cancer diagnoses in the family. It is also possible there is a hereditary cause for the cancer in the family that Jennifer Mosley did not inherit and therefore was not identified in her testing.  Therefore, it is important to remain in touch with cancer genetics in the future so that we can continue to offer Jennifer Mosley the most up to date genetic testing.   ADDITIONAL GENETIC TESTING: We discussed with Jennifer Mosley that her genetic testing was fairly extensive.  If there are genes identified to increase cancer risk that can be analyzed in the future, we would be happy to discuss and coordinate this testing at that time.    CANCER SCREENING RECOMMENDATIONS: Jennifer Mosley test result is considered negative (normal).  This means that we have not identified a hereditary cause for her history of colon polyps/family history of cancer at this time.  While reassuring, this does not definitively rule out a hereditary predisposition to cancer or colon polyps. It is still possible that there could be genetic mutations that are undetectable by current technology. There could be genetic mutations in genes that have not been tested or identified to increase cancer risk.  Therefore, it is recommended she continue to follow the  cancer management and screening guidelines provided by her  primary healthcare provider.   This negative genetic test simply tells [place] that we cannot yet define why Jennifer Mosley has had an increased number of colorectal polyps. Jennifer Mosley medical management and screening should be based on the prospect that she will likely form more colon polyps and should, therefore, undergo more frequent colonoscopy screening at intervals determined by her GI providers.  We also recommended that Jennifer Mosley have an upper endoscopy periodically.  She may also have a clinical diagnosis of Serrated Polyposis Syndrome, which we discussed:  A clinical diagnosis of serrated polyposis is considered in an individual who meets at least one of the following empiric criteria:  1. At least 5 serrated polyps proximal to the rectum with 2 or more of these being >10 mm 2. >/= 20 serrated polyps of any size, with >5 being proximal to the rectum but distributed throughout the colon.  Currently, no causative gene has been identified  for serrated polyposis. RNF43 seems to be responsible for some cases. The risk for colon cancer in this syndrome is elevated, although the precise risk remains to be defined.  Occasionally, more than one affected case of serrated polyposis is seen in a family, and therefore family members should undergo colon screening.  Surveillance recommendations for individuals with serrated polyposis include:  1. Colonoscopy with polypectomy until all polyps >/= 5 mm are removed, then colonoscopy every 1-3 years depending on the number and size of polyps.  Clearing of all polyps is preferable but not always possible. 2. Consider surgical referral if colonoscopic treatment and/or surveillance is inadequate or if high-grade dysplasia occurs.   The risk for colon cancer in relatives of individuals with serrated polyposis is still unclear.  Pending further data, it is reasonable to screen first-degree relatives at  the youngest age of onset of serrated polyposis diagnosis and subsequently per colonoscopic findings.  First degree relatives are encouraged to have colonoscopy at the earliest of the following: 1. Age 38 2. Same age as youngest diagnosis of serrated polyposis if uncomplicated by cancer,  3. 10 years earlier than the earliest diagnosis in family of colorectal cancer complicating serrated polyposis.  Following the baseline exam, family members should repeat their colonoscopy every 5 years if no polyps are found.  If proximal serrated polyps or multiple adenomas are found, consider colonoscopy every 3 years.  An individual's cancer risk and medical management are not determined by genetic test results alone. Overall cancer risk assessment incorporates additional factors, including personal medical history, family history, and any available genetic information that may result in a personalized plan for cancer prevention and surveillance.   RECOMMENDATIONS FOR FAMILY MEMBERS:  Relatives in this family might be at some increased risk of developing cancer, over the general population risk, simply due to the family history of cancer.  We recommended female relatives in this family have a yearly mammogram beginning at age 5, or 92 years younger than the earliest onset of cancer, an annual clinical breast exam, and perform monthly breast self-exams. Female relatives in this family should also have a gynecological exam as recommended by their primary provider.  All family members should be referred for colonoscopy starting at age 31.    It is also possible there is a hereditary cause for the cancer in Jennifer Mosley's family that she did not inherit and therefore was not identified in her.  Based on Jennifer Mosley's family history, we recommended her brother who had colon cancer in his 37s have genetic counseling and testing. Jennifer Mosley. [place] will let us know if we can be of any assistance in coordinating genetic  counseling and/or testing for these family members.  FOLLOW-UP: Lastly, we discussed with Jennifer Mosley. [place] that cancer genetics is a rapidly advancing field and it is possible that new genetic tests will be appropriate for her and/or her family members in the future. We encouraged her to remain in contact with cancer genetics on an annual basis so we can update her personal and family histories and let her know of advances in cancer genetics that may benefit this family.   Our contact number was provided. Jennifer Mosley. [place] questions were answered to her satisfaction, and she knows she is welcome to call us at anytime with additional questions or concerns.   Faith Rogue, Jennifer Mosley, [facility] Genetic Counselor [email or web address] Phone: [phone]

## 2019-07-03 NOTE — Progress Notes (Signed)
Office Visit    Patient Name: Jennifer Mosley Date of Encounter: 07/03/2019  Primary Care Provider:  [name], MD Primary Cardiologist:  Nelva Bush, MD Electrophysiologist:  None   Chief Complaint    Jennifer Mosley is a 64 y.o. female with a hx of HTN, HLD, Dm2, CVA, COPD, anxiety, CAD, diastolic dysfunction presents today for follow up of CAD after addition of Imdur.  Past Medical History    Past Medical History:  Diagnosis Date  . Anxiety   . COPD (chronic obstructive pulmonary disease) ([place])   . Depression   . Essential hypertension   . Family history of breast cancer   . Family history of cervical cancer   . Family history of colon cancer   . Hyperlipidemia   . Hypertension   . Personal history of colonic polyps   . Stroke ([place])   . 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 BILATERAL CARPAL TUNNEL RELEASE    . COLONOSCOPY    . COLONOSCOPY WITH PROPOFOL N/A 03/15/2017   Procedure: COLONOSCOPY WITH PROPOFOL;  Surgeon: [name], MD;  Location: [facility] ENDOSCOPY;  Service: Gastroenterology;  Laterality: N/A;  . COLONOSCOPY WITH PROPOFOL N/A 03/29/2018   Procedure: COLONOSCOPY WITH PROPOFOL;  Surgeon: [name], MD;  Location: [facility] ENDOSCOPY;  Service: Gastroenterology;  Laterality: N/A;  . COLONOSCOPY WITH PROPOFOL N/A 05/23/2019   Procedure: COLONOSCOPY WITH PROPOFOL;  Surgeon: [name], MD;  Location: [facility] ENDOSCOPY;  Service: Gastroenterology;  Laterality: N/A;  . ECTOPIC PREGNANCY SURGERY    . ESOPHAGOGASTRODUODENOSCOPY (EGD) WITH PROPOFOL N/A 05/23/2019   Procedure: ESOPHAGOGASTRODUODENOSCOPY (EGD) WITH PROPOFOL;  Surgeon: [name], MD;  Location: [facility] ENDOSCOPY;  Service: Gastroenterology;  Laterality: N/A;  . LEFT HEART CATH AND CORONARY ANGIOGRAPHY Left 05/02/2019   Procedure: LEFT HEART CATH AND CORONARY ANGIOGRAPHY;  Surgeon: Nelva Bush, MD;  Location: [place] CV LAB;  Service: Cardiovascular;  Laterality: Left;    Allergies  No Known Allergies  History of Present Illness    Jennifer Mosley is a 64 y.o. female with a hx of HTN, HLD, Dm2, multiple CVA, COPD, anxiety, CAD, diastolic dysfunction. She was last seen in clinic 05/31/19.  Previously evaluated by Dr. Fletcher Anon in 2016 with normal pharmacological myocardial perfusion stress test. Echo 2017 in setting of CVA with mild LVH, LVEF 55-60%, gr1DD. Was seen by Dr. Saunders Revel 04/28/19 at request of Dr. Vicente Males of GI for one year history of chest discomfort with exertion, stable exertional dyspnea, and chronic LE edema. She was recommended for cardiac catheterization, started on Metoprolol tartrate.   Cardiac catheterization 05/02/19 with mild to moderate nonobstructive CAD (30% distal LMCA and prox LAD, 50% ostial and prox LCx lesion flanking high OM1) and normal LVEF with mildly elevated filling pressures consistent with diastolic dysfunction. She was started on Aspirin 81mg  daily. Recommended for statin pending review of her liver function. Labs 05/02/19 with AST 54, ALT 34, alkaliline phosphatase 191, albumin 2.7. Cholesterol panel with total cholesterol 208, HDL 48, LDL 144, triglycerides 81.   Has had colonoscopy with Dr. Vicente Males of GI since catheterization. Previous workup includes resection of multiple polyps on 03/15/17, 03/29/18, and 05/23/19. Her abdominal ultrasound 04/13/19 showed slightly nodular hepatic contour suggestive of cirrhosis. She was recommended for liver biopsy and genetic testing regarding polyps.  She told Dr. Vicente Males she was going to think about the biopsy as she had concerns that she has had  a previous kidney biopsy and did not enjoy the experience.  Family history notable for 2 sisters with CAD.   She has previously declined home BP cuff and cardiac rehab. At clinic visit 05/31/19 Imdur was added. Discussion regarding statin was started. Her mother had cirrhosis which Miss Phagan presumed  due to Atorvastatin. Reached out to Dr. Vicente Males of GI who said statin would be appropriate. Because of her liver dysfunction recommended to avoid Zetia, Nexlizet. PCSK9i would be fine for Childs A/B (she is presently B) but unstudied in C. She has had sessile polyp on colonscopy with subsequent genetic testing unrevealing. She has been recommended for liver biopsy for consideration of autoimmune hepatitis which she has declined.  Dr. Vicente Males did request to change Metoprolol to Propranolol or Nadolol to help provide prophylaxis from variceal bleeding. Sees Dr. Lennox Grumbles (primary care) later this month.   Reports no shortness of breath at rest. Reports improvement in her dyspnea on exertion. Using her inhaler with good response.  Reports no chest pain, pressure, or tightness. No edema, orthopnea, PND. Reports no palpitations. Endorses headaches each times she takes Imdur.   Long discussion regarding cholesterol medications, liver dysfunction, LDL goal, and recommendations by GO.   Her 45 year old granddaughter lives with her.  EKGs/Labs/Other Studies Reviewed:   The following studies were reviewed today:  LHC 05/02/2019 Conclusions: 1. Mild to moderate, non-obstructive coronary artery disease, including 30% distal LMCA and proximal LAD stenoses, as well as 50% ostial and proximal LCx lesions flanking high OM1. 2. Normal left ventricular contraction with mildly elevated filling pressure consistent with diastolic dysfunction.   Recommendations: 1. Continue medical therapy to prevent progression of disease.  We will check a lipid panel and LFTs with intention of adding statin therapy if possible in the setting of underlying liver disease. 2. Proceed with GI evaluation.  EKG:  EKG is ordered today.  The ekg ordered today demonstrates NSR 73 bpm with LVH and stable mildly prolonged QT (QTc 180)  Recent Labs: 04/28/2019: BUN 9; Creatinine, Ser 0.74; Hemoglobin 14.6; Platelets 117; Potassium 4.6; Sodium  141 05/02/2019: ALT 34  Recent Lipid Panel    Component Value Date/Time   CHOL 208 (H) 05/02/2019 0952   TRIG 81 05/02/2019 0952   HDL 48 05/02/2019 0952   CHOLHDL 4.3 05/02/2019 0952   VLDL 16 05/02/2019 0952   LDLCALC 144 (H) 05/02/2019 0952    Home Medications   Current Meds  Medication Sig  . albuterol (PROAIR HFA) 108 (90 Base) MCG/ACT inhaler Inhale 2 puffs into the lungs every 4 (four) hours as needed for wheezing or shortness of breath.   Marland Kitchen aspirin 81 MG tablet Take 81 mg by mouth daily.  . enalapril (VASOTEC) 10 MG tablet Take 1 tablet (10 mg total) by mouth daily.  Marland Kitchen esomeprazole (NEXIUM) 40 MG capsule Take 40 mg by mouth daily at 12 noon.  Marland Kitchen FLOVENT HFA 220 MCG/ACT inhaler Inhale 2 puffs into the lungs daily as needed (shortness of breath).   . furosemide (LASIX) 20 MG tablet Take 1 tablet (20 mg total) by mouth as needed. For Lower extremity edema.  Marland Kitchen levothyroxine (SYNTHROID) 88 MCG tablet Take 88 mcg by mouth daily.  . nitroGLYCERIN (NITROSTAT) 0.4 MG SL tablet Place 1 tablet (0.4 mg total) under the tongue every 5 (five) minutes as needed for chest pain.  Marland Kitchen oxybutynin (DITROPAN-XL) 10 MG 24 hr tablet Take 10 mg by mouth daily.  . TRADJENTA 5 MG TABS tablet Take 5 mg by mouth  daily.  . [DISCONTINUED] furosemide (LASIX) 20 MG tablet Take 20 mg by mouth daily.  . [DISCONTINUED] isosorbide mononitrate (IMDUR) 30 MG 24 hr tablet Take 1 tablet (30 mg total) by mouth daily.  . [DISCONTINUED] metoprolol tartrate (LOPRESSOR) 25 MG tablet Take 1 tablet (25 mg total) by mouth 2 (two) times daily.    Review of Systems   Review of Systems  Constitutional: Negative for chills, fever and malaise/fatigue.  Cardiovascular: Positive for dyspnea on exertion. Negative for chest pain, leg swelling, near-syncope, orthopnea, palpitations and syncope.  Respiratory: Negative for cough, shortness of breath and wheezing.   Gastrointestinal: Negative for nausea and vomiting.  Neurological:  Negative for dizziness, light-headedness and weakness.   All other systems reviewed and are otherwise negative except as noted above.  Physical Exam    VS:  BP 130/86 (BP Location: Left Arm, Patient Position: Sitting, Cuff Size: Normal)   Pulse (!) 55   Ht 5' 4.5" (1.638 m)   Wt 238 lb 6 oz (108.1 kg)   SpO2 97%   BMI 40.29 kg/m  , BMI Body mass index is 40.29 kg/m. GEN: Well nourished, overweight, well developed, in no acute distress. HEENT: normal. Neck: Supple, no JVD, carotid bruits, or masses. Cardiac: RRR, no murmurs, rubs, or gallops. No clubbing, cyanosis, edema.  Radials/DP/PT 2+ and equal bilaterally.  Respiratory:  Respirations regular and unlabored, clear to auscultation bilaterally. GI: Soft, nontender, nondistended, BS + x 4. MS: No deformity or atrophy. Skin: Warm and dry, no rash.  Right radial cath site clean, dry, intact with no ecchymosis, erythema or signs of infection. Neuro:  Strength and sensation are intact. Psych: Normal affect  Assessment & Plan    1. CAD - Cardiac cath 05/23/19 with mild-moderate nonobstructive CAD.  Reports improvement in DOE since last seen and no chest pain.  Present GDMT includes aspirin, beta-blocker. Start Crestor 20mg  daily.  Intolerant of Imdur with headache, stop Imdur. Low sodium diet encouraged.  Stop Metoprolol, start Nadolol 20mg  daily per request of GI to transition to alternate beta blocker for prophylaxis of esophageal varices.  2. HLD, LDL goal <70 -Most recent LDL of 144 - LDL goal <70. Discussed with Dr. Vicente Males of GI who encouraged statin, recommended to avoid Nexletol, and PCSK9i would be appropriate as well if needed. She is understandably concerned about her liver function and reassured we would monitor closely. Start Crestor 20mg  daily. Repeat lipid/liver in 6 weeks.  3. Diastolic dysfunction -Euvolemic on exam today. Continue ACE inhibitor. She is uncertain if she is taking Lasix. She will check her medications when she  gets home. Provided Rx for Lasix 20mg  as needed.   4. Elevated liver enzymes/?Cirrhosis - Following with GI regarding possible cirrhosis.  Recommended for liver biopsy but is presently considering this.  Monitor liver function carefully and continue to coordinate with GI for utilization of statin.   5. Kidney cyst -noted on MRI of the abdomen.  Following with nephrology.  6. DOE -likely multifactorial obesity, deconditioning, diastolic dysfunction, coronary disease, COPD.  Regular cardiovascular exercise and weight loss encouraged.  Disposition: Follow up in 3 month(s) with Dr. Saunders Revel or APP.    Loel Dubonnet, NP 07/03/2019, 9:33 PM

## 2019-07-03 NOTE — Patient Instructions (Signed)
Medication Instructions:   Your physician has recommended you make the following change in your medication:   1.  STOP taking your Imdur (Isosorbide Mononitrate). 2.  STOP taking your Metoprolol Tartrate (Lopressor) 3.  START Crestor (Rosuvastatin): Take 1 tablet (20 mg total) by mouth daily. 4.  START Nadolol (Corgard): Take 1 tablet (20 mg total) by mouth daily  *If you need a refill on your cardiac medications before your next appointment, please call your pharmacy*   Lab Work: Your physician recommends that you return for lab work in: 6 weeks.  If you have labs (blood work) drawn today and your tests are completely normal, you will receive your results only by: Marland Kitchen MyChart Message (if you have MyChart) OR . A paper copy in the mail If you have any lab test that is abnormal or we need to change your treatment, we will call you to review the results.   Testing/Procedures: None Ordered.   Follow-Up: At [facility], you and your health needs are our priority.  As part of our continuing mission to provide you with exceptional heart care, we have created designated Provider Care Teams.  These Care Teams include your primary Cardiologist (physician) and Advanced Practice Providers (APPs -  Physician Assistants and Nurse Practitioners) who all work together to provide you with the care you need, when you need it.  We recommend signing up for the patient portal called "MyChart".  Sign up information is provided on this After Visit Summary.  MyChart is used to connect with patients for Virtual Visits (Telemedicine).  Patients are able to view lab/test results, encounter notes, upcoming appointments, etc.  Non-urgent messages can be sent to your provider as well.   To learn more about what you can do with MyChart, go to [email or web address].    Your next appointment:   3 month(s)  The format for your next appointment:   In Person  Provider:    You may see Nelva Bush, MD or  one of the following Advanced Practice Providers on your designated Care Team:    Murray Hodgkins, NP  Christell Faith, PA-C  Marrianne Mood, PA-C  Laurann Montana, NP    Other Instructions N/A

## 2019-07-03 NOTE — Telephone Encounter (Signed)
Revealed negative genetic testing.    We discussed that we do not know why she has had colon polyps or why there is cancer in the family. It could be due to a different gene that we are not testing, or something our current technology cannot pick up.  It will be important for her to keep in contact with genetics to learn if additional testing may be needed in the future. She may have a diagnosis of Serrated Polyposis Syndrome. We discussed that her first degree relatives should have colonoscopies if they have not had them already.

## 2019-08-09 ENCOUNTER — Other Ambulatory Visit: Payer: Self-pay

## 2019-08-09 ENCOUNTER — Ambulatory Visit
Admission: RE | Admit: 2019-08-09 | Discharge: 2019-08-09 | Disposition: A | Discharge status: Home / Self Care | Payer: Medicare Other | Source: Ambulatory Visit | Attending: Urology | Admitting: Urology

## 2019-08-09 DIAGNOSIS — N2889 Other specified disorders of kidney and ureter: Secondary | ICD-10-CM | POA: Diagnosis present

## 2019-08-09 MED ORDER — GADOBUTROL 1 MMOL/ML IV SOLN
10.0000 mL | Freq: Once | INTRAVENOUS | Status: AC | PRN
  Administered 2019-08-09: 10 mL via INTRAVENOUS

## 2019-08-22 ENCOUNTER — Emergency Department: Payer: Medicare Other

## 2019-08-22 ENCOUNTER — Other Ambulatory Visit: Payer: Self-pay

## 2019-08-22 ENCOUNTER — Encounter: Payer: Self-pay | Admitting: Emergency Medicine

## 2019-08-22 ENCOUNTER — Inpatient Hospital Stay
Admission: EM | Admit: 2019-08-22 | Discharge: 2019-09-13 | DRG: 177 | Disposition: E | Payer: Medicare Other | Attending: Internal Medicine | Admitting: Internal Medicine

## 2019-08-22 DIAGNOSIS — D696 Thrombocytopenia, unspecified: Secondary | ICD-10-CM

## 2019-08-22 DIAGNOSIS — R0602 Shortness of breath: Secondary | ICD-10-CM

## 2019-08-22 DIAGNOSIS — Z6841 Body Mass Index (BMI) 40.0 and over, adult: Secondary | ICD-10-CM

## 2019-08-22 DIAGNOSIS — I959 Hypotension, unspecified: Secondary | ICD-10-CM | POA: Diagnosis not present

## 2019-08-22 DIAGNOSIS — K828 Other specified diseases of gallbladder: Secondary | ICD-10-CM | POA: Diagnosis present

## 2019-08-22 DIAGNOSIS — J440 Chronic obstructive pulmonary disease with acute lower respiratory infection: Secondary | ICD-10-CM | POA: Diagnosis present

## 2019-08-22 DIAGNOSIS — Z515 Encounter for palliative care: Secondary | ICD-10-CM | POA: Diagnosis not present

## 2019-08-22 DIAGNOSIS — Z8249 Family history of ischemic heart disease and other diseases of the circulatory system: Secondary | ICD-10-CM

## 2019-08-22 DIAGNOSIS — E039 Hypothyroidism, unspecified: Secondary | ICD-10-CM | POA: Diagnosis present

## 2019-08-22 DIAGNOSIS — R748 Abnormal levels of other serum enzymes: Secondary | ICD-10-CM

## 2019-08-22 DIAGNOSIS — R0603 Acute respiratory distress: Secondary | ICD-10-CM | POA: Diagnosis not present

## 2019-08-22 DIAGNOSIS — E785 Hyperlipidemia, unspecified: Secondary | ICD-10-CM | POA: Diagnosis present

## 2019-08-22 DIAGNOSIS — J1282 Pneumonia due to coronavirus disease 2019: Secondary | ICD-10-CM | POA: Diagnosis present

## 2019-08-22 DIAGNOSIS — I5032 Chronic diastolic (congestive) heart failure: Secondary | ICD-10-CM

## 2019-08-22 DIAGNOSIS — J9601 Acute respiratory failure with hypoxia: Secondary | ICD-10-CM | POA: Diagnosis present

## 2019-08-22 DIAGNOSIS — I509 Heart failure, unspecified: Secondary | ICD-10-CM | POA: Diagnosis not present

## 2019-08-22 DIAGNOSIS — Z79890 Hormone replacement therapy (postmenopausal): Secondary | ICD-10-CM

## 2019-08-22 DIAGNOSIS — I5031 Acute diastolic (congestive) heart failure: Secondary | ICD-10-CM | POA: Diagnosis not present

## 2019-08-22 DIAGNOSIS — E119 Type 2 diabetes mellitus without complications: Secondary | ICD-10-CM | POA: Diagnosis not present

## 2019-08-22 DIAGNOSIS — K760 Fatty (change of) liver, not elsewhere classified: Secondary | ICD-10-CM | POA: Diagnosis present

## 2019-08-22 DIAGNOSIS — K7469 Other cirrhosis of liver: Secondary | ICD-10-CM | POA: Diagnosis present

## 2019-08-22 DIAGNOSIS — R001 Bradycardia, unspecified: Secondary | ICD-10-CM | POA: Diagnosis not present

## 2019-08-22 DIAGNOSIS — E11610 Type 2 diabetes mellitus with diabetic neuropathic arthropathy: Secondary | ICD-10-CM | POA: Diagnosis present

## 2019-08-22 DIAGNOSIS — Z79899 Other long term (current) drug therapy: Secondary | ICD-10-CM

## 2019-08-22 DIAGNOSIS — J9600 Acute respiratory failure, unspecified whether with hypoxia or hypercapnia: Secondary | ICD-10-CM | POA: Diagnosis present

## 2019-08-22 DIAGNOSIS — R7989 Other specified abnormal findings of blood chemistry: Secondary | ICD-10-CM | POA: Diagnosis not present

## 2019-08-22 DIAGNOSIS — Z8673 Personal history of transient ischemic attack (TIA), and cerebral infarction without residual deficits: Secondary | ICD-10-CM | POA: Diagnosis not present

## 2019-08-22 DIAGNOSIS — Z801 Family history of malignant neoplasm of trachea, bronchus and lung: Secondary | ICD-10-CM | POA: Diagnosis not present

## 2019-08-22 DIAGNOSIS — Z7189 Other specified counseling: Secondary | ICD-10-CM | POA: Diagnosis not present

## 2019-08-22 DIAGNOSIS — I110 Hypertensive heart disease with heart failure: Secondary | ICD-10-CM | POA: Diagnosis present

## 2019-08-22 DIAGNOSIS — E872 Acidosis: Secondary | ICD-10-CM | POA: Diagnosis present

## 2019-08-22 DIAGNOSIS — G931 Anoxic brain damage, not elsewhere classified: Secondary | ICD-10-CM | POA: Diagnosis not present

## 2019-08-22 DIAGNOSIS — R0600 Dyspnea, unspecified: Secondary | ICD-10-CM | POA: Diagnosis not present

## 2019-08-22 DIAGNOSIS — R319 Hematuria, unspecified: Secondary | ICD-10-CM

## 2019-08-22 DIAGNOSIS — U071 COVID-19: Secondary | ICD-10-CM | POA: Diagnosis present

## 2019-08-22 DIAGNOSIS — R0902 Hypoxemia: Secondary | ICD-10-CM

## 2019-08-22 DIAGNOSIS — I10 Essential (primary) hypertension: Secondary | ICD-10-CM | POA: Diagnosis present

## 2019-08-22 DIAGNOSIS — R1084 Generalized abdominal pain: Secondary | ICD-10-CM

## 2019-08-22 DIAGNOSIS — Z7984 Long term (current) use of oral hypoglycemic drugs: Secondary | ICD-10-CM

## 2019-08-22 DIAGNOSIS — Z66 Do not resuscitate: Secondary | ICD-10-CM | POA: Diagnosis not present

## 2019-08-22 DIAGNOSIS — E875 Hyperkalemia: Secondary | ICD-10-CM | POA: Diagnosis not present

## 2019-08-22 DIAGNOSIS — Z87891 Personal history of nicotine dependence: Secondary | ICD-10-CM | POA: Diagnosis not present

## 2019-08-22 DIAGNOSIS — Z7982 Long term (current) use of aspirin: Secondary | ICD-10-CM

## 2019-08-22 LAB — TRIGLYCERIDES: Triglycerides: 137 mg/dL (ref <150)

## 2019-08-22 LAB — COMPREHENSIVE METABOLIC PANEL
ALT: 22 U/L (ref 0–44)
AST: 82 U/L — ABNORMAL HIGH (ref 15–41)
Albumin: 2.2 g/dL — ABNORMAL LOW (ref 3.5–5.0)
Alkaline Phosphatase: 122 U/L (ref 38–126)
Anion gap: 15 (ref 5–15)
BUN: 22 mg/dL (ref 8–23)
CO2: 21 mmol/L — ABNORMAL LOW (ref 22–32)
Calcium: 7.8 mg/dL — ABNORMAL LOW (ref 8.9–10.3)
Chloride: 95 mmol/L — ABNORMAL LOW (ref 98–111)
Creatinine, Ser: 1.19 mg/dL — ABNORMAL HIGH (ref 0.44–1.00)
GFR calc Af Amer: 57 mL/min — ABNORMAL LOW (ref >60)
GFR calc non Af Amer: 49 mL/min — ABNORMAL LOW (ref >60)
Glucose, Bld: 135 mg/dL — ABNORMAL HIGH (ref 70–99)
Potassium: 4.7 mmol/L (ref 3.5–5.1)
Sodium: 131 mmol/L — ABNORMAL LOW (ref 135–145)
Total Bilirubin: 1.8 mg/dL — ABNORMAL HIGH (ref 0.3–1.2)
Total Protein: 6.7 g/dL (ref 6.5–8.1)

## 2019-08-22 LAB — CBC WITH DIFFERENTIAL/PLATELET
Abs Immature Granulocytes: 0.15 10*3/uL — ABNORMAL HIGH (ref 0.00–0.07)
Basophils Absolute: 0 10*3/uL (ref 0.0–0.1)
Basophils Relative: 0 %
Eosinophils Absolute: 0 10*3/uL (ref 0.0–0.5)
Eosinophils Relative: 0 %
HCT: 46.2 % — ABNORMAL HIGH (ref 36.0–46.0)
Hemoglobin: 15.9 g/dL — ABNORMAL HIGH (ref 12.0–15.0)
Immature Granulocytes: 2 %
Lymphocytes Relative: 13 %
Lymphs Abs: 1 10*3/uL (ref 0.7–4.0)
MCH: 31.7 pg (ref 26.0–34.0)
MCHC: 34.4 g/dL (ref 30.0–36.0)
MCV: 92 fL (ref 80.0–100.0)
Monocytes Absolute: 0.3 10*3/uL (ref 0.1–1.0)
Monocytes Relative: 4 %
Neutro Abs: 6 10*3/uL (ref 1.7–7.7)
Neutrophils Relative %: 81 %
Platelets: 153 10*3/uL (ref 150–400)
RBC: 5.02 MIL/uL (ref 3.87–5.11)
RDW: 14 % (ref 11.5–15.5)
Smear Review: NORMAL
WBC: 7.5 10*3/uL (ref 4.0–10.5)
nRBC: 0 % (ref 0.0–0.2)

## 2019-08-22 LAB — FERRITIN: Ferritin: 409 ng/mL — ABNORMAL HIGH (ref 11–307)

## 2019-08-22 LAB — LACTIC ACID, PLASMA
Lactic Acid, Venous: 5.9 mmol/L (ref 0.5–1.9)
Lactic Acid, Venous: 2.3 mmol/L (ref 0.5–1.9)

## 2019-08-22 LAB — BRAIN NATRIURETIC PEPTIDE: B Natriuretic Peptide: 156.5 pg/mL — ABNORMAL HIGH (ref 0.0–100.0)

## 2019-08-22 LAB — C-REACTIVE PROTEIN: CRP: 16.1 mg/dL — ABNORMAL HIGH (ref <1.0)

## 2019-08-22 LAB — PROCALCITONIN: Procalcitonin: 0.74 ng/mL

## 2019-08-22 LAB — D-DIMER, QUANTITATIVE: D-Dimer, Quant: 2.46 ug/mL-FEU — ABNORMAL HIGH (ref 0.00–0.50)

## 2019-08-22 LAB — LACTATE DEHYDROGENASE: LDH: 644 U/L — ABNORMAL HIGH (ref 98–192)

## 2019-08-22 LAB — FIBRINOGEN: Fibrinogen: 470 mg/dL (ref 210–475)

## 2019-08-22 LAB — SARS CORONAVIRUS 2 BY RT PCR (HOSPITAL ORDER, PERFORMED IN ~~LOC~~ HOSPITAL LAB): SARS Coronavirus 2: POSITIVE — AB

## 2019-08-22 LAB — GLUCOSE, CAPILLARY: Glucose-Capillary: 137 mg/dL — ABNORMAL HIGH (ref 70–99)

## 2019-08-22 LAB — ABO/RH: ABO/RH(D): O POS

## 2019-08-22 MED ORDER — GUAIFENESIN-DM 100-10 MG/5ML PO SYRP
10.0000 mL | ORAL_SOLUTION | ORAL | Status: DC | PRN
  Administered 2019-08-24 – 2019-08-26 (×2): 10 mL via ORAL
  Filled 2019-08-22 (×3): qty 10

## 2019-08-22 MED ORDER — LEVOTHYROXINE SODIUM 88 MCG PO TABS
88.0000 ug | ORAL_TABLET | Freq: Every day | ORAL | Status: DC
  Administered 2019-08-23 – 2019-09-02 (×11): 88 ug via ORAL
  Filled 2019-08-22 (×16): qty 1

## 2019-08-22 MED ORDER — SODIUM CHLORIDE 0.9 % IV SOLN
200.0000 mg | Freq: Once | INTRAVENOUS | Status: DC
  Filled 2019-08-22: qty 40

## 2019-08-22 MED ORDER — NITROGLYCERIN 0.4 MG SL SUBL: 0.4000 mg | SUBLINGUAL_TABLET | SUBLINGUAL | Status: DC | PRN

## 2019-08-22 MED ORDER — SODIUM CHLORIDE 0.9 % IV SOLN
500.0000 mg | INTRAVENOUS | Status: DC
  Administered 2019-08-22 – 2019-08-23 (×2): 500 mg via INTRAVENOUS
  Filled 2019-08-22 (×3): qty 500

## 2019-08-22 MED ORDER — DEXAMETHASONE SODIUM PHOSPHATE 10 MG/ML IJ SOLN
6.0000 mg | INTRAMUSCULAR | Status: DC
  Filled 2019-08-22: qty 1

## 2019-08-22 MED ORDER — DEXAMETHASONE SODIUM PHOSPHATE 10 MG/ML IJ SOLN
8.0000 mg | Freq: Once | INTRAMUSCULAR | Status: AC
  Administered 2019-08-22: 8 mg via INTRAVENOUS
  Filled 2019-08-22: qty 1

## 2019-08-22 MED ORDER — SODIUM CHLORIDE 0.9 % IV SOLN: Freq: Once | INTRAVENOUS | Status: DC

## 2019-08-22 MED ORDER — DEXAMETHASONE SODIUM PHOSPHATE 10 MG/ML IJ SOLN: 6.0000 mg | INTRAMUSCULAR | Status: DC

## 2019-08-22 MED ORDER — ONDANSETRON HCL 4 MG PO TABS: 4.0000 mg | ORAL_TABLET | Freq: Four times a day (QID) | ORAL | Status: DC | PRN

## 2019-08-22 MED ORDER — PANTOPRAZOLE SODIUM 40 MG PO TBEC
40.0000 mg | DELAYED_RELEASE_TABLET | Freq: Every day | ORAL | Status: DC
  Administered 2019-08-23 – 2019-09-02 (×11): 40 mg via ORAL
  Filled 2019-08-22 (×12): qty 1

## 2019-08-22 MED ORDER — ALBUTEROL SULFATE HFA 108 (90 BASE) MCG/ACT IN AERS
2.0000 | INHALATION_SPRAY | RESPIRATORY_TRACT | Status: DC | PRN
  Filled 2019-08-22: qty 6.7

## 2019-08-22 MED ORDER — ASPIRIN EC 81 MG PO TBEC
81.0000 mg | DELAYED_RELEASE_TABLET | Freq: Every day | ORAL | Status: DC
  Administered 2019-08-23 – 2019-09-02 (×11): 81 mg via ORAL
  Filled 2019-08-22 (×12): qty 1

## 2019-08-22 MED ORDER — ENOXAPARIN SODIUM 40 MG/0.4ML ~~LOC~~ SOLN
40.0000 mg | Freq: Two times a day (BID) | SUBCUTANEOUS | Status: DC
  Administered 2019-08-23 – 2019-08-25 (×5): 40 mg via SUBCUTANEOUS
  Filled 2019-08-22 (×5): qty 0.4

## 2019-08-22 MED ORDER — NADOLOL 20 MG PO TABS
20.0000 mg | ORAL_TABLET | Freq: Every day | ORAL | Status: DC
  Administered 2019-08-22 – 2019-08-27 (×5): 20 mg via ORAL
  Filled 2019-08-22 (×7): qty 1

## 2019-08-22 MED ORDER — ONDANSETRON HCL 4 MG/2ML IJ SOLN
4.0000 mg | Freq: Four times a day (QID) | INTRAMUSCULAR | Status: DC | PRN
  Administered 2019-08-29: 4 mg via INTRAVENOUS
  Filled 2019-08-22: qty 2

## 2019-08-22 MED ORDER — SODIUM CHLORIDE 0.9 % IV SOLN
2.0000 g | INTRAVENOUS | Status: AC
  Administered 2019-08-22 – 2019-08-28 (×7): 2 g via INTRAVENOUS
  Filled 2019-08-22: qty 20
  Filled 2019-08-22 (×2): qty 2
  Filled 2019-08-22: qty 20
  Filled 2019-08-22 (×2): qty 2
  Filled 2019-08-22: qty 20

## 2019-08-22 MED ORDER — INSULIN ASPART 100 UNIT/ML ~~LOC~~ SOLN
0.0000 [IU] | Freq: Three times a day (TID) | SUBCUTANEOUS | Status: DC
  Administered 2019-08-23 (×2): 3 [IU] via SUBCUTANEOUS
  Administered 2019-08-24 – 2019-08-25 (×2): 7 [IU] via SUBCUTANEOUS
  Administered 2019-08-25: 3 [IU] via SUBCUTANEOUS
  Administered 2019-08-26: 7 [IU] via SUBCUTANEOUS
  Administered 2019-08-26: 4 [IU] via SUBCUTANEOUS
  Administered 2019-08-27: 7 [IU] via SUBCUTANEOUS
  Administered 2019-08-27: 11 [IU] via SUBCUTANEOUS
  Administered 2019-08-28: 4 [IU] via SUBCUTANEOUS
  Administered 2019-08-29: 3 [IU] via SUBCUTANEOUS
  Administered 2019-08-29: 4 [IU] via SUBCUTANEOUS
  Administered 2019-08-29: 7 [IU] via SUBCUTANEOUS
  Administered 2019-08-30: 15 [IU] via SUBCUTANEOUS
  Administered 2019-08-30: 4 [IU] via SUBCUTANEOUS
  Administered 2019-08-30: 15 [IU] via SUBCUTANEOUS
  Administered 2019-08-31 – 2019-09-01 (×3): 3 [IU] via SUBCUTANEOUS
  Administered 2019-09-02 (×2): 7 [IU] via SUBCUTANEOUS
  Administered 2019-09-02 – 2019-09-04 (×5): 4 [IU] via SUBCUTANEOUS
  Filled 2019-08-22 (×26): qty 1

## 2019-08-22 MED ORDER — ROSUVASTATIN CALCIUM 10 MG PO TABS
20.0000 mg | ORAL_TABLET | Freq: Every day | ORAL | Status: DC
  Administered 2019-08-23 – 2019-09-01 (×10): 20 mg via ORAL
  Filled 2019-08-22: qty 2
  Filled 2019-08-22: qty 1
  Filled 2019-08-22 (×6): qty 2
  Filled 2019-08-22: qty 1
  Filled 2019-08-22 (×2): qty 2

## 2019-08-22 MED ORDER — ASCORBIC ACID 500 MG PO TABS
500.0000 mg | ORAL_TABLET | Freq: Every day | ORAL | Status: DC
  Administered 2019-08-23 – 2019-09-02 (×11): 500 mg via ORAL
  Filled 2019-08-22 (×12): qty 1

## 2019-08-22 MED ORDER — ZINC SULFATE 220 (50 ZN) MG PO CAPS
220.0000 mg | ORAL_CAPSULE | Freq: Every day | ORAL | Status: DC
  Administered 2019-08-23 – 2019-09-02 (×11): 220 mg via ORAL
  Filled 2019-08-22 (×12): qty 1

## 2019-08-22 MED ORDER — ACETAMINOPHEN 325 MG PO TABS
650.0000 mg | ORAL_TABLET | Freq: Four times a day (QID) | ORAL | Status: DC | PRN
  Administered 2019-08-23 – 2019-08-25 (×2): 650 mg via ORAL
  Filled 2019-08-22 (×2): qty 2

## 2019-08-22 MED ORDER — SODIUM CHLORIDE 0.9 % IV SOLN: 100.0000 mg | Freq: Every day | INTRAVENOUS | Status: DC

## 2019-08-22 MED ORDER — SODIUM CHLORIDE 0.9% FLUSH: 3.0000 mL | INTRAVENOUS | Status: DC | PRN

## 2019-08-22 MED ORDER — SODIUM CHLORIDE 0.9% FLUSH
3.0000 mL | Freq: Two times a day (BID) | INTRAVENOUS | Status: DC
  Administered 2019-08-23 – 2019-09-08 (×30): 3 mL via INTRAVENOUS

## 2019-08-22 MED ORDER — SODIUM CHLORIDE 0.9 % IV SOLN
200.0000 mg | Freq: Once | INTRAVENOUS | Status: AC
  Administered 2019-08-22: 200 mg via INTRAVENOUS
  Filled 2019-08-22: qty 200

## 2019-08-22 MED ORDER — INSULIN DETEMIR 100 UNIT/ML ~~LOC~~ SOLN
0.1500 [IU]/kg | Freq: Two times a day (BID) | SUBCUTANEOUS | Status: DC
  Administered 2019-08-22 – 2019-08-31 (×18): 16 [IU] via SUBCUTANEOUS
  Filled 2019-08-22 (×24): qty 0.16

## 2019-08-22 MED ORDER — SODIUM CHLORIDE 0.9 % IV SOLN: 250.0000 mL | INTRAVENOUS | Status: DC | PRN

## 2019-08-22 MED ORDER — SODIUM CHLORIDE 0.9 % IV SOLN
100.0000 mg | Freq: Every day | INTRAVENOUS | Status: AC
  Administered 2019-08-23 – 2019-08-26 (×4): 100 mg via INTRAVENOUS
  Filled 2019-08-22: qty 20
  Filled 2019-08-22: qty 100
  Filled 2019-08-22: qty 20
  Filled 2019-08-22: qty 100

## 2019-08-22 MED ORDER — OXYBUTYNIN CHLORIDE ER 10 MG PO TB24
10.0000 mg | ORAL_TABLET | Freq: Every day | ORAL | Status: DC
  Administered 2019-08-22 – 2019-09-02 (×11): 10 mg via ORAL
  Filled 2019-08-22 (×14): qty 1

## 2019-08-22 MED ORDER — FUROSEMIDE 10 MG/ML IJ SOLN
40.0000 mg | Freq: Every day | INTRAMUSCULAR | Status: DC
  Administered 2019-08-23 – 2019-08-25 (×3): 40 mg via INTRAVENOUS
  Filled 2019-08-22 (×3): qty 4

## 2019-08-22 MED ORDER — FLUTICASONE PROPIONATE HFA 220 MCG/ACT IN AERO
2.0000 | INHALATION_SPRAY | Freq: Every day | RESPIRATORY_TRACT | Status: DC | PRN
  Filled 2019-08-22 (×2): qty 12

## 2019-08-22 NOTE — ED Notes (Signed)
Meal tray provided with PO fluids.

## 2019-08-22 NOTE — Progress Notes (Signed)
Remdesivir - Pharmacy Brief Note   O:  ALT: 22 CXR: Cardiomegaly with pulmonary vascular congestion. Interstitial edema. Appearance is felt to be indicative of a degree of congestive heart failure. No airspace consolidation. SpO2: 90s% on 50 L HFNC   A/P:  Remdesivir 200 mg IVPB once followed by 100 mg IVPB daily x 4 days.   Dorena Bodo, PharmD 09/04/2019 2:16 PM

## 2019-08-22 NOTE — ED Notes (Signed)
Pts O2 dropping while on high flow. Respiratory recommends non-re breather. Non-re breather in place.

## 2019-08-22 NOTE — ED Provider Notes (Signed)
[facility] Emergency Department Provider Note    First MD Initiated Contact with Patient 08/19/2019 1139     (approximate)  I have reviewed the triage vital signs and the nursing notes.   HISTORY  Chief Complaint Shortness of Breath    HPI Jennifer Mosley is a 64 y.o. female the below listed past medical history presents to the ER for shortness of breath and weakness.  States that she recently tested positive for Covid but does not prefer this positive test.  Also has a history of cirrhosis COPD and high blood pressure.  Has been having nightly chills and fevers.  No productive cough.  No history of CHF.  She presents to the ER with acute respiratory failure with hypoxia arrives on CPAP.    Past Medical History:  Diagnosis Date  . Anxiety   . COPD (chronic obstructive pulmonary disease) ([place])   . Depression   . Essential hypertension   . Family history of breast cancer   . Family history of cervical cancer   . Family history of colon cancer   . Hyperlipidemia   . Hypertension   . Personal history of colonic polyps   . Stroke ([place])   . 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 Hypertension Mother   . Hyperlipidemia Mother   . Cervical cancer Mother   . Coronary artery disease Father 65       3-vessel CABG  . Colon cancer Father        dx 60s  . Lung cancer Father        dx 58s  . Breast cancer Cousin   . Heart attack Sister 65  . Heart attack Brother 73  . Cancer Maternal Aunt        unk types  . Cancer Maternal Uncle        unk type  . Cancer Paternal Aunt        unk type  . Skin cancer Sister   . Colon polyps Sister   . Colon cancer Brother        dx 98s   Past Surgical History:  Procedure Laterality Date  . BILATERAL CARPAL TUNNEL RELEASE    . COLONOSCOPY    . COLONOSCOPY WITH PROPOFOL N/A 03/15/2017   Procedure: COLONOSCOPY WITH PROPOFOL;  Surgeon: [name], MD;  Location: [facility] ENDOSCOPY;  Service: Gastroenterology;  Laterality: N/A;  . COLONOSCOPY WITH PROPOFOL N/A 03/29/2018   Procedure: COLONOSCOPY WITH PROPOFOL;  Surgeon: [name], MD;  Location: [facility] ENDOSCOPY;  Service: Gastroenterology;  Laterality: N/A;  . COLONOSCOPY WITH PROPOFOL N/A 05/23/2019   Procedure: COLONOSCOPY WITH PROPOFOL;  Surgeon: [name], MD;  Location: [facility] ENDOSCOPY;  Service: Gastroenterology;  Laterality: N/A;  . ECTOPIC PREGNANCY SURGERY    . ESOPHAGOGASTRODUODENOSCOPY (EGD) WITH PROPOFOL N/A 05/23/2019   Procedure: ESOPHAGOGASTRODUODENOSCOPY (EGD) WITH PROPOFOL;  Surgeon: [name], MD;  Location: [facility] ENDOSCOPY;  Service: Gastroenterology;  Laterality: N/A;  . LEFT HEART CATH AND CORONARY ANGIOGRAPHY Left 05/02/2019   Procedure: LEFT HEART CATH AND CORONARY ANGIOGRAPHY;  Surgeon: Nelva Bush, MD;  Location: [place] CV LAB;  Service: Cardiovascular;  Laterality: Left;   Patient Active Problem List   Diagnosis Date Noted  . Genetic testing 07/03/2019  . Personal history of colonic polyps 06/22/2019  . Family history of colon cancer   . Family history of breast cancer   . Family history  of cervical cancer   . Unstable angina ([place]) 05/02/2019  . Chest pain 04/29/2019  . Morbid obesity ([place]) 04/29/2019  . Neuroma 07/11/2015  . Plantar fasciitis 05/28/2015  . 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 Hyperlipidemia LDL goal <70   . Essential hypertension       Prior to Admission medications   Medication Sig Start Date End Date Taking? Authorizing Provider  albuterol (PROAIR HFA) 108 (90 Base) MCG/ACT inhaler Inhale 2 puffs into the lungs every 4 (four) hours as needed for wheezing or shortness of breath.     [provider]  aspirin 81 MG tablet Take 81 mg by mouth daily.    [provider]  enalapril (VASOTEC) 10 MG tablet Take 1 tablet (10 mg total) by mouth daily. 10/16/14   Wellington Hampshire, MD  esomeprazole (NEXIUM) 40 MG capsule Take 40 mg by mouth daily at 12 noon.    [provider]  FLOVENT HFA 220 MCG/ACT inhaler Inhale 2 puffs into the lungs daily as needed (shortness of breath).  04/06/19   [provider]  furosemide (LASIX) 20 MG tablet Take 1 tablet (20 mg total) by mouth as needed. For Lower extremity edema. 07/03/19   Loel Dubonnet, NP  levothyroxine (SYNTHROID) 88 MCG tablet Take 88 mcg by mouth daily. 06/08/19   [provider]  nadolol (CORGARD) 20 MG tablet Take 1 tablet (20 mg total) by mouth daily. 07/03/19   Loel Dubonnet, NP  nitroGLYCERIN (NITROSTAT) 0.4 MG SL tablet Place 1 tablet (0.4 mg total) under the tongue every 5 (five) minutes as needed for chest pain. 04/28/19 07/27/19  End, Harrell Gave, MD  oxybutynin (DITROPAN-XL) 10 MG 24 hr tablet Take 10 mg by mouth daily. 02/07/19   [provider]  rosuvastatin (CRESTOR) 20 MG tablet Take 1 tablet (20 mg total) by mouth daily. 07/03/19 10/01/19  Loel Dubonnet, NP  TRADJENTA 5 MG TABS tablet Take 5 mg by mouth daily. 02/07/19   [provider]    Allergies Patient has no known allergies.    Social History Social History   Tobacco Use  . Smoking status: Former Smoker    Packs/day: 0.50    Years: 37.00    Pack years: 18.50    Types: Cigarettes    Quit date: 03/16/2015    Years since quitting: 4.4  . Smokeless tobacco: Never Used  Vaping Use  . Vaping Use: Never used  Substance Use Topics  . Alcohol use: Yes    Alcohol/week: 1.0 standard drink    Types: 1 Cans of beer per week  . Drug use: No    Review of Systems Patient denies headaches, rhinorrhea, blurry vision, numbness, shortness of breath, chest pain, edema, cough, abdominal pain, nausea, vomiting, diarrhea, dysuria, fevers, rashes or hallucinations unless otherwise stated above in HPI. ____________________________________________   PHYSICAL EXAM:  VITAL SIGNS: Vitals:    08/29/2019 1200 08/21/2019 1230  BP: 116/88 135/70  Pulse: 77 86  Resp: 11 17  Temp:    SpO2: 95% 95%    Constitutional: Alert and oriented. Critically ill appearing Eyes: Conjunctivae are normal.  Head: Atraumatic. Nose: No congestion/rhinnorhea. Mouth/Throat: Mucous membranes are moist.   Neck: No stridor. Painless ROM.  Cardiovascular: Normal rate, regular rhythm. Grossly normal heart sounds.  Good peripheral circulation. Respiratory: tachypnea with diffuse crackles throughoutGastrointestinal: Soft and nontender. No distention. No abdominal bruits. No CVA tenderness. Genitourinary:  Musculoskeletal: No lower extremity  tenderness nor edema.  No joint effusions. Neurologic:  Normal speech and language. No gross focal neurologic deficits are appreciated. No facial droop Skin:  Skin is warm, dry and intact. No rash noted. Psychiatric: Mood and affect are normal. Speech and behavior are normal.  ____________________________________________   LABS (all labs ordered are listed, but only abnormal results are displayed)  Results for orders placed or performed during the hospital encounter of 09/03/2019 (from the past 24 hour(s))  Lactic acid, plasma     Status: Abnormal   Collection Time: 08/16/2019 11:40 AM  Result Value Ref Range   Lactic Acid, Venous 5.9 (HH) 0.5 - 1.9 mmol/L  CBC WITH DIFFERENTIAL     Status: Abnormal   Collection Time: 08/17/2019 11:40 AM  Result Value Ref Range   WBC 7.5 4.0 - 10.5 K/uL   RBC 5.02 3.87 - 5.11 MIL/uL   Hemoglobin 15.9 (H) 12.0 - 15.0 g/dL   HCT 46.2 (H) 36 - 46 %   MCV 92.0 80.0 - 100.0 fL   MCH 31.7 26.0 - 34.0 pg   MCHC 34.4 30.0 - 36.0 g/dL   RDW 14.0 11.5 - 15.5 %   Platelets 153 150 - 400 K/uL   nRBC 0.0 0.0 - 0.2 %   Neutrophils Relative % 81 %   Neutro Abs 6.0 1.7 - 7.7 K/uL   Lymphocytes Relative 13 %   Lymphs Abs 1.0 0.7 - 4.0 K/uL   Monocytes Relative 4 %   Monocytes Absolute 0.3 0 - 1 K/uL   Eosinophils Relative 0 %   Eosinophils  Absolute 0.0 0 - 0 K/uL   Basophils Relative 0 %   Basophils Absolute 0.0 0 - 0 K/uL   WBC Morphology MORPHOLOGY UNREMARKABLE    RBC Morphology MORPHOLOGY UNREMARKABLE    Smear Review Normal platelet morphology    Immature Granulocytes 2 %   Abs Immature Granulocytes 0.15 (H) 0.00 - 0.07 K/uL  Comprehensive metabolic panel     Status: Abnormal   Collection Time: 09/12/2019 11:40 AM  Result Value Ref Range   Sodium 131 (L) 135 - 145 mmol/L   Potassium 4.7 3.5 - 5.1 mmol/L   Chloride 95 (L) 98 - 111 mmol/L   CO2 21 (L) 22 - 32 mmol/L   Glucose, Bld 135 (H) 70 - 99 mg/dL   BUN 22 8 - 23 mg/dL   Creatinine, Ser 1.19 (H) 0.44 - 1.00 mg/dL   Calcium 7.8 (L) 8.9 - 10.3 mg/dL   Total Protein 6.7 6.5 - 8.1 g/dL   Albumin 2.2 (L) 3.5 - 5.0 g/dL   AST 82 (H) 15 - 41 U/L   ALT 22 0 - 44 U/L   Alkaline Phosphatase 122 38 - 126 U/L   Total Bilirubin 1.8 (H) 0.3 - 1.2 mg/dL   GFR calc non Af Amer 49 (L) >60 mL/min   GFR calc Af Amer 57 (L) >60 mL/min   Anion gap 15 5 - 15  Procalcitonin     Status: None   Collection Time: 08/25/2019 11:40 AM  Result Value Ref Range   Procalcitonin 0.74 ng/mL  Lactate dehydrogenase     Status: Abnormal   Collection Time: 08/19/2019 11:40 AM  Result Value Ref Range   LDH 644 (H) 98 - 192 U/L  Ferritin     Status: Abnormal   Collection Time: 08/30/2019 11:40 AM  Result Value Ref Range   Ferritin 409 (H) 11 - 307 ng/mL  Triglycerides     Status:  None   Collection Time: 08/18/2019 11:40 AM  Result Value Ref Range   Triglycerides 137 <150 mg/dL  Fibrinogen     Status: None   Collection Time: 08/29/2019 11:40 AM  Result Value Ref Range   Fibrinogen 470 210 - 475 mg/dL   ____________________________________________  EKG My review and personal interpretation at Time: 11:32   Indication: sob  Rate: 80  Rhythm: sinus Axis: normal Other: normal intervals, no stemi ____________________________________________  RADIOLOGY  I personally reviewed all radiographic  images ordered to evaluate for the above acute complaints and reviewed radiology reports and findings.  These findings were personally discussed with the patient.  Please see medical record for radiology report.  ____________________________________________   PROCEDURES  Procedure(s) performed:  .Critical Care Performed by: Merlyn Lot, MD Authorized by: Merlyn Lot, MD   Critical care provider statement:    Critical care time (minutes):  45   Critical care time was exclusive of:  Separately billable procedures and treating other patients   Critical care was necessary to treat or prevent imminent or life-threatening deterioration of the following conditions:  Respiratory failure   Critical care was time spent personally by me on the following activities:  Development of treatment plan with patient or surrogate, discussions with consultants, evaluation of patient's response to treatment, examination of patient, obtaining history from patient or surrogate, ordering and performing treatments and interventions, ordering and review of laboratory studies, ordering and review of radiographic studies, pulse oximetry, re-evaluation of patient's condition and review of old charts      Critical Care performed: yes ____________________________________________   INITIAL IMPRESSION / [place] / ED COURSE  Pertinent labs & imaging results that were available during my care of the patient were reviewed by me and considered in my medical decision making (see chart for details).   DDX: covid, Asthma, copd, CHF, pna, ptx, malignancy, Pe, anemia   KIYOKO MCGUIRT is a 64 y.o. who presents to the ED with evidence of acute respiratory failure requiring significant supplemental oxygen and respiratory support placed on high flow nasal cannula due to suspected Covid.  She is protecting her airway.  Chest x-ray shows diffuse edema possible CHF however patient does not have a history  of this and I suspect that she has multilobar pneumonia secondary to Covid.  Blood work we sent for the but differential.  She states that she feels well on high flow.  Clinical Course as of Aug 21 1401  Tue Aug 22, 2019  1306 Patient peers a comfortable on high flow nasal cannula.   [PR]  1610 Patient without any white count. Lactate is elevated but in the setting of her known cirrhosis may be secondary to underlying liver disease as well as her hypoxia. She is normotensive right now do not want to give aggressive IV fluid resuscitation due to concern for volume overload and I am also suspicious this is secondary to COVID-19 pneumonia. Will give antibiotics. She will require hospitalization.   [PR]    Clinical Course User Index [PR] Merlyn Lot, MD    The patient was evaluated in Emergency Department today for the symptoms described in the history of present illness. He/she was evaluated in the context of the global COVID-19 pandemic, which necessitated consideration that the patient might be at risk for infection with the SARS-CoV-2 virus that causes COVID-19. Institutional protocols and algorithms that pertain to the evaluation of patients at risk for COVID-19 are in a state of rapid change based on  information released by regulatory bodies including the CDC and federal and state organizations. These policies and algorithms were followed during the patient's care in the ED.  As part of my medical decision making, I reviewed the following data within the [place] notes reviewed and incorporated, Labs reviewed, notes from prior ED visits and [place] Controlled Substance Database   ____________________________________________   FINAL CLINICAL IMPRESSION(S) / ED DIAGNOSES  Final diagnoses:  Acute respiratory failure with hypoxia ([place])  Pneumonia due to COVID-19 virus      NEW MEDICATIONS STARTED DURING THIS VISIT:  New Prescriptions   No medications on file      Note:  This document was prepared using Dragon voice recognition software and may include unintentional dictation errors.    Merlyn Lot, MD 09/02/[id number]

## 2019-08-22 NOTE — ED Triage Notes (Signed)
Pt presents via acems with c/o shortness of breath. Neighbor of patient called ems due to pt having altered mental status. When ems arrived patient was reported to be 48% on room air. Ems placed patient on CPAP and oxygen saturations improved to 88%. Pt is currently 77% on CPAP. MD Quentin Cornwall at bedside verbal order for high flow nasal cannula. Pt respirations even and slightly labored at this time. Pt states she does not feel short of breath at this time. Pt alert and oriented x4 at this time.Respiratory at bedside now to place patient on high flow nasal cannula.

## 2019-08-22 NOTE — Progress Notes (Signed)
PHARMACIST - PHYSICIAN COMMUNICATION  CONCERNING:  Enoxaparin (Lovenox) for DVT Prophylaxis    RECOMMENDATION: Patient was prescribed enoxaprin 40mg  q24 hours for VTE prophylaxis.   Filed Weights   08/15/2019 1151  Weight: 108.1 kg (238 lb 5.1 oz)    Body mass index is 40.91 kg/m.  Estimated Creatinine Clearance: 58.9 mL/min (A) (by C-G formula based on SCr of 1.19 mg/dL (H)).   Based on [place] patient is candidate for enoxaparin 40mg  every 12 hour dosing due to BMI being >40.  DESCRIPTION: Pharmacy has adjusted enoxaparin dose per [facility]/[place] policy.  Patient is now receiving enoxaparin 40mg  every 12 hours.    Kwinton Maahs, PharmD Clinical Pharmacist  08/23/2019 2:49 PM

## 2019-08-22 NOTE — ED Notes (Signed)
Lab called to collect patient admission labs

## 2019-08-22 NOTE — H&P (Addendum)
History and Physical    MIOSHA BEHE ZHY:[id number] DOB: July 14, 1955 DOA: 09/04/2019  PCP: [name], MD   Patient coming from: Home  I have personally briefly reviewed patient's old medical records in [place]  Chief Complaint: Shortness of breath  HPI: Jennifer Mosley is a 64 y.o. female with medical history significant for COPD, hypertension, liver cirrhosis and CHF who presents to the ER by EMS for evaluation of shortness of breath.  Patient's neighbor had called EMS because of mental status changes.  When EMS arrived patient was said to have a pulse oximetry of 48% on room air.  She was placed on CPAP with improvement in her pulse oximetry to 88%.  By the time she arrived in the ER her pulse oximetry was 77% on CPAP and patient was switched to high flow nasal cannula.  She is currently awake, alert and oriented to person place and time. Patient tested positive for COVID-19 virus on July 31 but had symptoms for 24 hours prior to getting tested.  Other family members are sick as well.  She is unvaccinated. She has a cough which is nonproductive and also complains of nausea and poor oral intake. Labs reveal a sodium of 131, potassium of 4.7, BUN of 22, creatinine, BNP of 156, LDH of 644, ferritin 409, procalcitonin of 0.74, lactic acid of 5.9, white count of 7.5 and hemoglobin of 15.9. Chest x-ray reviewed by me shows cardiomegaly with pulmonary vascular congestion. Interstitial edema. No airspace consolidation. Twelve-lead EKG reviewed by me shows sinus rhythm with LVH   ED Course: Patient is a 64 year old female for evaluation of mental status changes.  Patient is on August 12, 2019 and has had progressively worsening symptoms since then.  She was hypoxic in the field with room air pulse oximetry of 48% and she was placed on CPAP with pulse oximetry still in the 70s. She is currently on high flow nasal cannula 50//100% with pulse oximetry of 91%.  Chest x-ray suggestive of  possible pulmonary edema.  Lactic acid is elevated at 5.9 and prolactin is 0.74.  Patient received IV Rocephin and Zithromax in the ER.  Patient will be admitted to the hospital for further evaluation.   Review of Systems: As per HPI otherwise 10 point review of systems negative.    Past Medical History:  Diagnosis Date  . Anxiety   . COPD (chronic obstructive pulmonary disease) ([place])   . Depression   . Essential hypertension   . Family history of breast cancer   . Family history of cervical cancer   . Family history of colon cancer   . Hyperlipidemia   . Hypertension   . Personal history of colonic polyps   . Stroke ([place])   . 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 BILATERAL CARPAL TUNNEL RELEASE    . COLONOSCOPY    . COLONOSCOPY WITH PROPOFOL N/A 03/15/2017   Procedure: COLONOSCOPY WITH PROPOFOL;  Surgeon: [name], MD;  Location: [facility] ENDOSCOPY;  Service: Gastroenterology;  Laterality: N/A;  . COLONOSCOPY WITH PROPOFOL N/A 03/29/2018   Procedure: COLONOSCOPY WITH PROPOFOL;  Surgeon: [name], MD;  Location: [facility] ENDOSCOPY;  Service: Gastroenterology;  Laterality: N/A;  . COLONOSCOPY WITH PROPOFOL N/A 05/23/2019   Procedure: COLONOSCOPY WITH PROPOFOL;  Surgeon: [name], MD;  Location: [facility] ENDOSCOPY;  Service: Gastroenterology;  Laterality: N/A;  . ECTOPIC PREGNANCY SURGERY    . ESOPHAGOGASTRODUODENOSCOPY (  EGD) WITH PROPOFOL N/A 05/23/2019   Procedure: ESOPHAGOGASTRODUODENOSCOPY (EGD) WITH PROPOFOL;  Surgeon: [name], MD;  Location: [facility] ENDOSCOPY;  Service: Gastroenterology;  Laterality: N/A;  . LEFT HEART CATH AND CORONARY ANGIOGRAPHY Left 05/02/2019   Procedure: LEFT HEART CATH AND CORONARY ANGIOGRAPHY;  Surgeon: Nelva Bush, MD;  Location: [place] CV LAB;  Service: Cardiovascular;  Laterality: Left;     reports that she quit smoking about 4 years ago. Her smoking use included  cigarettes. She has a 18.50 pack-year smoking history. She has never used smokeless tobacco. She reports current alcohol use of about 1.0 standard drink of alcohol per week. She reports that she does not use drugs.  No Known Allergies  Family History  Problem Relation Age of Onset  . Hypertension Mother   . Hyperlipidemia Mother   . Cervical cancer Mother   . Coronary artery disease Father 52       3-vessel CABG  . Colon cancer Father        dx 34s  . Lung cancer Father        dx 68s  . Breast cancer Cousin   . Heart attack Sister 28  . Heart attack Brother 23  . Cancer Maternal Aunt        unk types  . Cancer Maternal Uncle        unk type  . Cancer Paternal Aunt        unk type  . Skin cancer Sister   . Colon polyps Sister   . Colon cancer Brother        dx 77s     Prior to Admission medications   Medication Sig Start Date End Date Taking? Authorizing Provider  albuterol (PROAIR HFA) 108 (90 Base) MCG/ACT inhaler Inhale 2 puffs into the lungs every 4 (four) hours as needed for wheezing or shortness of breath.    Yes [provider]  aspirin 81 MG tablet Take 81 mg by mouth daily.   Yes [provider]  Calcium Carbonate-Vitamin D3 (CALCIUM 600-D) 600-400 MG-UNIT TABS Take 1 tablet by mouth in the morning and at bedtime.   Yes [provider]  enalapril (VASOTEC) 20 MG tablet Take 20 mg by mouth daily.   Yes [provider]  FLOVENT HFA 220 MCG/ACT inhaler Inhale 2 puffs into the lungs daily as needed (shortness of breath).  04/06/19  Yes [provider]  furosemide (LASIX) 20 MG tablet Take 1 tablet (20 mg total) by mouth as needed. For Lower extremity edema. 07/03/19  Yes Loel Dubonnet, NP  ibuprofen (ADVIL) 800 MG tablet Take 800 mg by mouth every 8 (eight) hours as needed for mild pain or moderate pain.   Yes [provider]  levothyroxine (SYNTHROID) 88 MCG tablet Take 88 mcg by mouth daily. 06/08/19  Yes [provider]  magnesium oxide (MAG-OX) 400 MG tablet Take 400 mg by mouth 2 (two) times daily.   Yes [provider]  oxybutynin (DITROPAN-XL) 10 MG 24 hr tablet Take 10 mg by mouth daily. 02/07/19  Yes [provider]  TRADJENTA 5 MG TABS tablet Take 5 mg by mouth daily. 02/07/19  Yes [provider]  nadolol (CORGARD) 20 MG tablet Take 1 tablet (20 mg total) by mouth daily. 07/03/19   Loel Dubonnet, NP  nitroGLYCERIN (NITROSTAT) 0.4 MG SL tablet Place 1 tablet (0.4 mg total) under the tongue every 5 (five) minutes as needed for chest pain. 04/28/19 07/27/19  End,  Harrell Gave, MD  rosuvastatin (CRESTOR) 20 MG tablet Take 1 tablet (20 mg total) by mouth daily. 07/03/19 10/01/19  Loel Dubonnet, NP    Physical Exam: Vitals:   09/08/2019 1200 08/21/2019 1230 08/27/2019 1400 09/06/2019 1430  BP: 116/88 135/70 (!) 153/76 (!) 154/84  Pulse: 77 86 71 73  Resp: 11 17 (!) 22   Temp:      TempSrc:      SpO2: 95% 95% 92%   Weight:      Height:         Vitals:   08/30/2019 1200 09/08/2019 1230 08/16/2019 1400 08/21/2019 1430  BP: 116/88 135/70 (!) 153/76 (!) 154/84  Pulse: 77 86 71 73  Resp: 11 17 (!) 22   Temp:      TempSrc:      SpO2: 95% 95% 92%   Weight:      Height:        Constitutional: NAD, alert and oriented x 3.  Chronically ill-appearing Eyes: PERRL, lids and conjunctivae normal ENMT: Mucous membranes are moist.  Neck: normal, supple, no masses, no thyromegaly Respiratory: Air movement in both lung fields, no wheezing, crackles at the bases. Normal respiratory effort. No accessory muscle use.  Cardiovascular: Regular rate and rhythm, no murmurs / rubs / gallops. No extremity edema. 2+ pedal pulses. No carotid bruits.  Abdomen: no tenderness, no masses palpated. No hepatosplenomegaly. Bowel sounds positive.  Musculoskeletal: no clubbing / cyanosis. No joint deformity upper and lower extremities.  Skin: no rashes, lesions, ulcers.  Neurologic: No gross focal  neurologic deficit. Psychiatric: Normal mood and affect.   Labs on Admission: I have personally reviewed following labs and imaging studies  CBC: Recent Labs  Lab 08/14/2019 1140  WBC 7.5  NEUTROABS 6.0  HGB 15.9*  HCT 46.2*  MCV 92.0  PLT 938   Basic Metabolic Panel: Recent Labs  Lab 09/08/2019 1140  NA 131*  K 4.7  CL 95*  CO2 21*  GLUCOSE 135*  BUN 22  CREATININE 1.19*  CALCIUM 7.8*   GFR: Estimated Creatinine Clearance: 58.9 mL/min (A) (by C-G formula based on SCr of 1.19 mg/dL (H)). Liver Function Tests: Recent Labs  Lab 08/15/2019 1140  AST 82*  ALT 22  ALKPHOS 122  BILITOT 1.8*  PROT 6.7  ALBUMIN 2.2*   No results for input(s): LIPASE, AMYLASE in the last 168 hours. No results for input(s): AMMONIA in the last 168 hours. Coagulation Profile: No results for input(s): INR, PROTIME in the last 168 hours. Cardiac Enzymes: No results for input(s): CKTOTAL, CKMB, CKMBINDEX, TROPONINI in the last 168 hours. BNP (last 3 results) No results for input(s): PROBNP in the last 8760 hours. HbA1C: No results for input(s): HGBA1C in the last 72 hours. CBG: No results for input(s): GLUCAP in the last 168 hours. Lipid Profile: Recent Labs    09/12/2019 1140  TRIG 137   Thyroid Function Tests: No results for input(s): TSH, T4TOTAL, FREET4, T3FREE, THYROIDAB in the last 72 hours. Anemia Panel: Recent Labs    09/08/2019 1140  FERRITIN 409*   Urine analysis: No results found for: COLORURINE, APPEARANCEUR, LABSPEC, [place], GLUCOSEU, HGBUR, BILIRUBINUR, KETONESUR, PROTEINUR, UROBILINOGEN, NITRITE, LEUKOCYTESUR  Radiological Exams on Admission: DG Chest Port 1 View  Result Date: 08/17/2019 CLINICAL DATA:  Respiratory distress/shortness of breath EXAM: PORTABLE CHEST 1 VIEW COMPARISON:  March 21, 2017 FINDINGS: There is cardiomegaly with pulmonary venous hypertension. There is interstitial edema. There is no airspace opacity. No adenopathy appreciable. No bone lesions.  IMPRESSION: Cardiomegaly with pulmonary vascular congestion. Interstitial edema. Appearance is felt to be indicative of a degree of congestive heart failure. No airspace consolidation. Electronically Signed   By: Lowella Grip III M.D.   On: 09/05/2019 12:07    EKG: Independently reviewed.    Assessment/Plan Principal Problem:   Acute respiratory failure due to COVID-19 [facility]) Active Problems:   Essential hypertension   Morbid obesity ([place])   Pneumonia due to COVID-19 virus   Acute CHF (congestive heart failure) (H[facility])    Acute respiratory failure due to COVID-19  Patient was brought in for evaluation of mental status changes and shortness of breath and in the field she was noted to have room air pulse oximetry of 48%.   She was placed on CPAP by EMS and pulse oximetry improved to to the high 70s. Patient is currently on high flow nasal cannula and pulse oximetry is 91% on 50L/100% FiO2 Continue to maintain pulse oximetry of about 92%   COVID-19 pneumonia Place patient on Decadron 6 mg IV daily Place patient on remdesivir protocol Continue oxygen supplementation to maintain pulse oximetry greater than 92%   CHF Unclear etiology We will obtain 2D echocardiogram to assess LVEF Place patient on Lasix 40 mg IV daily Continue beta-blocker   Diabetes mellitus Maintain consistent carbohydrate diet Glycemic control with sliding scale insulin as well as Levemir   Sepsis As evidenced by elevated lactic acid level of 5.9 on admission Procalcitonin level of 0.74 We will continue empiric antibiotic therapy with Rocephin and Zithromax Trend lactic acid levels Patient did not receive sepsis fluids due to her underlying COVID-19 status and risk of worsening respiratory failure with aggressive IV fluid hydration   Hypothyroidism Continue Synthroid   Morbid obesity (BMI 40) Complicates overall prognosis and care   DVT prophylaxis: Lovenox Code Status: Full code Family  Communication: Greater than 50% of time was spent discussing patient's condition had bedside.  All questions and concerns have been addressed.  She verbalizes understanding and agrees with the plan. Disposition Plan: Back to previous home environment Consults called: None    Tanisa Lagace MD [facility]     08/26/2019, 3:33 PM

## 2019-08-23 ENCOUNTER — Inpatient Hospital Stay (HOSPITAL_COMMUNITY)
Admit: 2019-08-23 | Discharge: 2019-08-23 | Disposition: A | Discharge status: Home / Self Care | Payer: Medicare Other | Attending: Internal Medicine | Admitting: Internal Medicine

## 2019-08-23 DIAGNOSIS — I5031 Acute diastolic (congestive) heart failure: Secondary | ICD-10-CM

## 2019-08-23 LAB — COMPREHENSIVE METABOLIC PANEL
ALT: 21 U/L (ref 0–44)
AST: 70 U/L — ABNORMAL HIGH (ref 15–41)
Albumin: 2 g/dL — ABNORMAL LOW (ref 3.5–5.0)
Alkaline Phosphatase: 128 U/L — ABNORMAL HIGH (ref 38–126)
Anion gap: 8 (ref 5–15)
BUN: 21 mg/dL (ref 8–23)
CO2: 28 mmol/L (ref 22–32)
Calcium: 7.7 mg/dL — ABNORMAL LOW (ref 8.9–10.3)
Chloride: 98 mmol/L (ref 98–111)
Creatinine, Ser: 0.86 mg/dL (ref 0.44–1.00)
GFR calc Af Amer: >60 mL/min (ref >60)
GFR calc non Af Amer: >60 mL/min (ref >60)
Glucose, Bld: 128 mg/dL — ABNORMAL HIGH (ref 70–99)
Potassium: 5.1 mmol/L (ref 3.5–5.1)
Sodium: 134 mmol/L — ABNORMAL LOW (ref 135–145)
Total Bilirubin: 1.3 mg/dL — ABNORMAL HIGH (ref 0.3–1.2)
Total Protein: 6.5 g/dL (ref 6.5–8.1)

## 2019-08-23 LAB — LACTIC ACID, PLASMA: Lactic Acid, Venous: 2.6 mmol/L (ref 0.5–1.9)

## 2019-08-23 LAB — CBC WITH DIFFERENTIAL/PLATELET
Abs Immature Granulocytes: 0.11 10*3/uL — ABNORMAL HIGH (ref 0.00–0.07)
Basophils Absolute: 0.1 10*3/uL (ref 0.0–0.1)
Basophils Relative: 1 %
Eosinophils Absolute: 0 10*3/uL (ref 0.0–0.5)
Eosinophils Relative: 0 %
HCT: 45.8 % (ref 36.0–46.0)
Hemoglobin: 15.9 g/dL — ABNORMAL HIGH (ref 12.0–15.0)
Immature Granulocytes: 1 %
Lymphocytes Relative: 12 %
Lymphs Abs: 1.3 10*3/uL (ref 0.7–4.0)
MCH: 31.2 pg (ref 26.0–34.0)
MCHC: 34.7 g/dL (ref 30.0–36.0)
MCV: 89.8 fL (ref 80.0–100.0)
Monocytes Absolute: 0.3 10*3/uL (ref 0.1–1.0)
Monocytes Relative: 3 %
Neutro Abs: 8.8 10*3/uL — ABNORMAL HIGH (ref 1.7–7.7)
Neutrophils Relative %: 83 %
Platelets: 146 10*3/uL — ABNORMAL LOW (ref 150–400)
RBC: 5.1 MIL/uL (ref 3.87–5.11)
RDW: 14 % (ref 11.5–15.5)
WBC: 10.6 10*3/uL — ABNORMAL HIGH (ref 4.0–10.5)
nRBC: 0 % (ref 0.0–0.2)

## 2019-08-23 LAB — GLUCOSE, CAPILLARY
Glucose-Capillary: 124 mg/dL — ABNORMAL HIGH (ref 70–99)
Glucose-Capillary: 120 mg/dL — ABNORMAL HIGH (ref 70–99)
Glucose-Capillary: 144 mg/dL — ABNORMAL HIGH (ref 70–99)
Glucose-Capillary: 138 mg/dL — ABNORMAL HIGH (ref 70–99)

## 2019-08-23 LAB — C-REACTIVE PROTEIN: CRP: 12.4 mg/dL — ABNORMAL HIGH (ref <1.0)

## 2019-08-23 LAB — HIV ANTIBODY (ROUTINE TESTING W REFLEX): HIV Screen 4th Generation wRfx: NONREACTIVE

## 2019-08-23 LAB — FIBRIN DERIVATIVES D-DIMER (ARMC ONLY): Fibrin derivatives D-dimer (ARMC): 6210.93 ng/mL (FEU) — ABNORMAL HIGH (ref 0.00–499.00)

## 2019-08-23 LAB — PHOSPHORUS: Phosphorus: 2.8 mg/dL (ref 2.5–4.6)

## 2019-08-23 LAB — MAGNESIUM: Magnesium: 2.2 mg/dL (ref 1.7–2.4)

## 2019-08-23 LAB — ECHOCARDIOGRAM COMPLETE
Height: 64 in
S' Lateral: 2.78 cm
Weight: 3813.08 oz

## 2019-08-23 LAB — FERRITIN: Ferritin: 296 ng/mL (ref 11–307)

## 2019-08-23 MED ORDER — LINAGLIPTIN 5 MG PO TABS
5.0000 mg | ORAL_TABLET | Freq: Every day | ORAL | Status: DC
  Administered 2019-08-23 – 2019-08-31 (×9): 5 mg via ORAL
  Filled 2019-08-23 (×9): qty 1

## 2019-08-23 MED ORDER — METHYLPREDNISOLONE SODIUM SUCC 40 MG IJ SOLR
40.0000 mg | Freq: Two times a day (BID) | INTRAMUSCULAR | Status: DC
  Administered 2019-08-23 – 2019-09-02 (×21): 40 mg via INTRAVENOUS
  Filled 2019-08-23 (×21): qty 1

## 2019-08-23 NOTE — ED Notes (Signed)
Pt repositioned, high flow repositioned and non-re breather takne off per pts request. O2 sats at 86-89%.

## 2019-08-23 NOTE — ED Notes (Signed)
CBG READING= 99

## 2019-08-23 NOTE — ED Notes (Signed)
Lab to come draw morning labs at 0600.

## 2019-08-23 NOTE — ED Notes (Signed)
Pt placed on purwick at this time. Purwick in place, patient has brief under her and clean chux. Will continue to monitor.

## 2019-08-23 NOTE — Progress Notes (Signed)
Transported pt to 237 on NRB. Pt is now back on [place] with NRB. Pt tol well. Report given to Floor RT.

## 2019-08-23 NOTE — Progress Notes (Signed)
Patient assisted to a side lying prone position. The patient was educated on the benefits of being in the prone position. Voices understanding.Will continue to monitor.

## 2019-08-23 NOTE — ED Notes (Signed)
Report provided to floor RN. Pt to go up to floor with respiratory and tech.

## 2019-08-23 NOTE — Progress Notes (Signed)
*  PRELIMINARY RESULTS* Echocardiogram 2D Echocardiogram has been performed.  Sherrie Sport 08/23/2019, 9:20 AM

## 2019-08-23 NOTE — Progress Notes (Signed)
PROGRESS NOTE    [name]  TDS:[id number] DOB: 10/05/1955 DOA: 08/29/2019 PCP: [name], MD   Brief Narrative: Taken from H&P Jennifer Mosley is a 64 y.o. female with medical history significant for COPD, hypertension, liver cirrhosis and CHF who presents to the ER by EMS for evaluation of shortness of breath.  Patient's neighbor had called EMS because of mental status changes.  When EMS arrived patient was said to have a pulse oximetry of 48% on room air.  She was placed on CPAP with improvement in her pulse oximetry to 88%.  By the time she arrived in the ER her pulse oximetry was 77% on CPAP and patient was switched to high flow nasal cannula.  Patient tested positive for COVID-19 virus on July 31 but had symptoms for 24 hours prior to getting tested.  Other family members are sick as well.  She is unvaccinated. On arrival she had elevated inflammatory markers, lactic acidosis and elevated procalcitonin.  Chest x-ray with pulmonary vascular congestion/bilateral infiltrate.  Subjective: Patient is still feeling short of breath when seen today.  We discussed regarding getting Actemra in the setting of elevated CRP and requiring 50L with heated HFNC.  Does not want to get Actemra at this time due to her prior liver disease.  Assessment & Plan:   Principal Problem:   Acute respiratory failure due to COVID-19 [facility]) Active Problems:   Essential hypertension   Morbid obesity ([place])   Pneumonia due to COVID-19 virus   Acute CHF (congestive heart failure) ([place])  Acute hypoxic respiratory failure secondary to COVID-19 pneumonia. Elevated inflammatory markers, lactic acidosis which is trending down but has not resolved yet.  She was requiring higher level of oxygen and saturating barely at 90%.  Discussed about getting Actemra and she refused. -Continue remdesivir.-Day 2. -Switch Decadron with Solu Medrol and see if that will help. -Continue supportive care. -Continue with  supplemental oxygen with heated HFNC to maintain saturation above 90%. -Try proning. -Continue to monitor inflammatory markers. -Repeat chest x-ray tomorrow morning.  Lactic acidosis.  Sepsis ruled out.  Most likely secondary to hypoxia.  Now trending down, patient did not receive IV fluid for concern of pulmonary vascular congestion. Most likely is SIRS response, she did not meet sepsis criteria completely. Blood cultures remain negative. Procalcitonin elevated at 0.74 and she was also started on ceftriaxone and azithromycin for a possible superadded bacterial infection. -Continue to monitor. -Continue with antibiotics.  History of HFpEF/pulmonary vascular congestion.  Pulmonary vascular congestion was mentioned on her chest x-ray, similar picture can be obtained with COVID-19 pneumonia.  BNP normal.  Patient does not appear volume up on exam.  She was started on Lasix 40 mg daily and echocardiogram was ordered which was done-pending results. Her echocardiogram with grade 1 diastolic dysfunction. -Follow-up echo results. -Continue Lasix as it will help suspected ARDS secondary to COVID-19.  Diabetes mellitus.  No recent A1c in chart.  CBG currently within goal. -Check A1c -Continue home dose of Tradjenta. -Continue with sliding scale.  Hypothyroidism. -Continue home dose of Synthroid.  Morbid obesity. Body mass index is 40.91 kg/m.  This will complicate overall prognosis.  Objective: Vitals:   08/23/19 0730 08/23/19 0924 08/23/19 1202 08/23/19 1214  BP: 138/78  133/78   Pulse: (!) 53  (!) 51 (!) 51  Resp: (!) 25  (!) 25 (!) 22  Temp:      TempSrc:      SpO2: 91% 91% (!) 88% 94%  Weight:      Height:       No intake or output data in the 24 hours ending 08/23/19 1254 Filed Weights   08/30/2019 1151  Weight: 108.1 kg    Examination:  General exam: Morbidly obese lady, appears calm and comfortable  Respiratory system: Bilateral scattered rhonchi, respiratory effort  normal. Cardiovascular system: S1 & S2 heard, RRR. No JVD, murmurs, Gastrointestinal system: Soft, nontender, nondistended, bowel sounds positive. Central nervous system: Alert and oriented. No focal neurological deficits. Extremities: No edema, no cyanosis, pulses intact and symmetrical. Psychiatry: Judgement and insight appear normal. Mood & affect appropriate.    DVT prophylaxis: Lovenox Code Status: Full Family Communication:  Disposition Plan:  Status is: Inpatient  Remains inpatient appropriate because:Inpatient level of care appropriate due to severity of illness   Dispo: The patient is from: Home              Anticipated d/c is to: Home              Anticipated d/c date is: > 3 days              Patient currently is not medically stable to d/c.   Consultants:   None  Procedures:  Antimicrobials:  Ceftriaxone Azithromycin  Data Reviewed: I have personally reviewed following labs and imaging studies  CBC: Recent Labs  Lab 08/26/2019 1140 08/23/19 0724  WBC 7.5 10.6*  NEUTROABS 6.0 8.8*  HGB 15.9* 15.9*  HCT 46.2* 45.8  MCV 92.0 89.8  PLT 153 732*   Basic Metabolic Panel: Recent Labs  Lab 08/28/2019 1140 08/23/19 0724  NA 131* 134*  K 4.7 5.1  CL 95* 98  CO2 21* 28  GLUCOSE 135* 128*  BUN 22 21  CREATININE 1.19* 0.86  CALCIUM 7.8* 7.7*  MG  --  2.2  PHOS  --  2.8   GFR: Estimated Creatinine Clearance: 81.5 mL/min (by C-G formula based on SCr of 0.86 mg/dL). Liver Function Tests: Recent Labs  Lab 09/12/2019 1140 08/23/19 0724  AST 82* 70*  ALT 22 21  ALKPHOS 122 128*  BILITOT 1.8* 1.3*  PROT 6.7 6.5  ALBUMIN 2.2* 2.0*   No results for input(s): LIPASE, AMYLASE in the last 168 hours. No results for input(s): AMMONIA in the last 168 hours. Coagulation Profile: No results for input(s): INR, PROTIME in the last 168 hours. Cardiac Enzymes: No results for input(s): CKTOTAL, CKMB, CKMBINDEX, TROPONINI in the last 168 hours. BNP (last 3  results) No results for input(s): PROBNP in the last 8760 hours. HbA1C: No results for input(s): HGBA1C in the last 72 hours. CBG: Recent Labs  Lab 09/08/2019 1847 08/23/19 0727 08/23/19 1203  GLUCAP 137* 124* 120*   Lipid Profile: Recent Labs    09/08/2019 1140  TRIG 137   Thyroid Function Tests: No results for input(s): TSH, T4TOTAL, FREET4, T3FREE, THYROIDAB in the last 72 hours. Anemia Panel: Recent Labs    08/25/2019 1140 08/23/19 0724  FERRITIN 409* 296   Sepsis Labs: Recent Labs  Lab 08/28/2019 1140 08/21/2019 1912 08/23/19 0724  PROCALCITON 0.74  --   --   LATICACIDVEN 5.9* 2.3* 2.6*    Recent Results (from the past 240 hour(s))  Blood Culture (routine x 2)     Status: None (Preliminary result)   Collection Time: 09/10/2019 11:40 AM   Specimen: BLOOD  Result Value Ref Range Status   Specimen Description BLOOD LEFT ANTECUBITAL  Final   Special Requests   Final  BOTTLES DRAWN AEROBIC AND ANAEROBIC Blood Culture results may not be optimal due to an inadequate volume of blood received in culture bottles   Culture   Final    NO GROWTH < 24 HOURS Performed at [facility], [address] [place], [place] [address]    Report Status PENDING  Incomplete  Blood Culture (routine x 2)     Status: None (Preliminary result)   Collection Time: 08/21/2019 11:40 AM   Specimen: BLOOD  Result Value Ref Range Status   Specimen Description BLOOD BLOOD LEFT HAND  Final   Special Requests   Final    BOTTLES DRAWN AEROBIC AND ANAEROBIC Blood Culture adequate volume   Culture   Final    NO GROWTH < 24 HOURS Performed at [facility], [address] [place], [place] [address]    Report Status PENDING  Incomplete  SARS Coronavirus 2 by RT PCR (hospital order, performed in [place] hospital lab) Nasopharyngeal Nasopharyngeal Swab     Status: Abnormal   Collection Time: 08/23/2019 11:40 AM   Specimen: Nasopharyngeal Swab  Result Value Ref Range Status   SARS  Coronavirus 2 POSITIVE (A) NEGATIVE Final    Comment: RESULT CALLED TO, READ BACK BY AND VERIFIED WITH:  Verlene Mayer, RN @ 7846 ON 08/16/2019. PG. (NOTE) SARS-CoV-2 target nucleic acids are DETECTED  SARS-CoV-2 RNA is generally detectable in upper respiratory specimens  during the acute phase of infection.  Positive results are indicative  of the presence of the identified virus, but do not rule out bacterial infection or co-infection with other pathogens not detected by the test.  Clinical correlation with patient history and  other diagnostic information is necessary to determine patient infection status.  The expected result is negative.  Fact Sheet for Patients:   StrictlyIdeas.no   Fact Sheet for Healthcare Providers:   BankingDealers.co.za    This test is not yet approved or cleared by the [place] FDA and  has been authorized for detection and/or diagnosis of SARS-CoV-2 by FDA under an Emergency Use Authorization (EUA).  This EUA will remain in effect (meaning  this test can be used) for the duration of  the COVID-19 declaration under Section 564(b)(1) of the Act, 21 U.S.C. section 360-bbb-3(b)(1), unless the authorization is terminated or revoked sooner.  Performed at [facility], [address] [place],  [address]      Radiology Studies: DG Chest [place] 1 View  Result Date: 08/17/2019 CLINICAL DATA:  Respiratory distress/shortness of breath EXAM: PORTABLE CHEST 1 VIEW COMPARISON:  March 21, 2017 FINDINGS: There is cardiomegaly with pulmonary venous hypertension. There is interstitial edema. There is no airspace opacity. No adenopathy appreciable. No bone lesions. IMPRESSION: Cardiomegaly with pulmonary vascular congestion. Interstitial edema. Appearance is felt to be indicative of a degree of congestive heart failure. No airspace consolidation. Electronically Signed   By: Lowella Grip III M.D.   On:  08/29/2019 12:07    Scheduled Meds: . vitamin C  500 mg Oral Daily  . aspirin EC  81 mg Oral Daily  . dexamethasone (DECADRON) injection  6 mg Intravenous Q24H  . enoxaparin (LOVENOX) injection  40 mg Subcutaneous Q12H  . furosemide  40 mg Intravenous Daily  . insulin aspart  0-20 Units Subcutaneous TID WC  . insulin detemir  0.15 Units/kg Subcutaneous BID  . levothyroxine  88 mcg Oral Q0600  . nadolol  20 mg Oral Daily  . oxybutynin  10 mg Oral Daily  .  pantoprazole  40 mg Oral Daily  . rosuvastatin  20 mg Oral Daily  . sodium chloride flush  3 mL Intravenous Q12H  . zinc sulfate  220 mg Oral Daily   Continuous Infusions: . sodium chloride    . sodium chloride    . azithromycin Stopped (08/16/2019 1851)  . cefTRIAXone (ROCEPHIN)  IV Stopped (09/02/2019 1508)  . remdesivir 100 mg in NS 100 mL Stopped (08/23/19 1138)     LOS: 1 day   Time spent: 50 minutes.  Lorella Nimrod, MD [facility]  If 7PM-7AM, please contact night-coverage [email or web address]  08/23/2019, 12:54 PM   This record has been created using Systems analyst. Errors have been sought and corrected,but may not always be located. Such creation errors do not reflect on the standard of care.

## 2019-08-24 ENCOUNTER — Inpatient Hospital Stay: Payer: Medicare Other

## 2019-08-24 ENCOUNTER — Encounter: Payer: Self-pay | Admitting: Internal Medicine

## 2019-08-24 LAB — LACTIC ACID, PLASMA
Lactic Acid, Venous: 2.3 mmol/L (ref 0.5–1.9)
Lactic Acid, Venous: 2.3 mmol/L (ref 0.5–1.9)

## 2019-08-24 LAB — COMPREHENSIVE METABOLIC PANEL
ALT: 19 U/L (ref 0–44)
AST: 65 U/L — ABNORMAL HIGH (ref 15–41)
Albumin: 1.9 g/dL — ABNORMAL LOW (ref 3.5–5.0)
Alkaline Phosphatase: 141 U/L — ABNORMAL HIGH (ref 38–126)
Anion gap: 10 (ref 5–15)
BUN: 32 mg/dL — ABNORMAL HIGH (ref 8–23)
CO2: 26 mmol/L (ref 22–32)
Calcium: 7.7 mg/dL — ABNORMAL LOW (ref 8.9–10.3)
Chloride: 98 mmol/L (ref 98–111)
Creatinine, Ser: 0.83 mg/dL (ref 0.44–1.00)
GFR calc Af Amer: >60 mL/min (ref >60)
GFR calc non Af Amer: >60 mL/min (ref >60)
Glucose, Bld: 112 mg/dL — ABNORMAL HIGH (ref 70–99)
Potassium: 4.5 mmol/L (ref 3.5–5.1)
Sodium: 134 mmol/L — ABNORMAL LOW (ref 135–145)
Total Bilirubin: 1.3 mg/dL — ABNORMAL HIGH (ref 0.3–1.2)
Total Protein: 6.6 g/dL (ref 6.5–8.1)

## 2019-08-24 LAB — CBC WITH DIFFERENTIAL/PLATELET
Abs Immature Granulocytes: 0.14 10*3/uL — ABNORMAL HIGH (ref 0.00–0.07)
Basophils Absolute: 0 10*3/uL (ref 0.0–0.1)
Basophils Relative: 0 %
Eosinophils Absolute: 0 10*3/uL (ref 0.0–0.5)
Eosinophils Relative: 0 %
HCT: 45.8 % (ref 36.0–46.0)
Hemoglobin: 15.8 g/dL — ABNORMAL HIGH (ref 12.0–15.0)
Immature Granulocytes: 1 %
Lymphocytes Relative: 13 %
Lymphs Abs: 1.7 10*3/uL (ref 0.7–4.0)
MCH: 31.4 pg (ref 26.0–34.0)
MCHC: 34.5 g/dL (ref 30.0–36.0)
MCV: 91.1 fL (ref 80.0–100.0)
Monocytes Absolute: 0.5 10*3/uL (ref 0.1–1.0)
Monocytes Relative: 4 %
Neutro Abs: 10.4 10*3/uL — ABNORMAL HIGH (ref 1.7–7.7)
Neutrophils Relative %: 82 %
Platelets: 158 10*3/uL (ref 150–400)
RBC: 5.03 MIL/uL (ref 3.87–5.11)
RDW: 14.2 % (ref 11.5–15.5)
Smear Review: NORMAL
WBC: 12.9 10*3/uL — ABNORMAL HIGH (ref 4.0–10.5)
nRBC: 0.2 % (ref 0.0–0.2)

## 2019-08-24 LAB — HEMOGLOBIN A1C
Hgb A1c MFr Bld: 7.6 % — ABNORMAL HIGH (ref 4.8–5.6)
Mean Plasma Glucose: 171 mg/dL

## 2019-08-24 LAB — MAGNESIUM: Magnesium: 2.1 mg/dL (ref 1.7–2.4)

## 2019-08-24 LAB — GLUCOSE, CAPILLARY
Glucose-Capillary: 112 mg/dL — ABNORMAL HIGH (ref 70–99)
Glucose-Capillary: 249 mg/dL — ABNORMAL HIGH (ref 70–99)
Glucose-Capillary: 117 mg/dL — ABNORMAL HIGH (ref 70–99)
Glucose-Capillary: 197 mg/dL — ABNORMAL HIGH (ref 70–99)

## 2019-08-24 LAB — C-REACTIVE PROTEIN: CRP: 8.1 mg/dL — ABNORMAL HIGH (ref <1.0)

## 2019-08-24 LAB — FERRITIN: Ferritin: 206 ng/mL (ref 11–307)

## 2019-08-24 LAB — FIBRIN DERIVATIVES D-DIMER (ARMC ONLY): Fibrin derivatives D-dimer (ARMC): >7500 ng/mL (FEU) — ABNORMAL HIGH (ref 0.00–499.00)

## 2019-08-24 LAB — PHOSPHORUS: Phosphorus: 2.5 mg/dL (ref 2.5–4.6)

## 2019-08-24 MED ORDER — AZITHROMYCIN 250 MG PO TABS
500.0000 mg | ORAL_TABLET | Freq: Every day | ORAL | Status: AC
  Administered 2019-08-24 – 2019-08-26 (×3): 500 mg via ORAL
  Filled 2019-08-24 (×3): qty 2

## 2019-08-24 MED ORDER — IOHEXOL 350 MG/ML SOLN
100.0000 mL | Freq: Once | INTRAVENOUS | Status: AC | PRN
  Administered 2019-08-24: 100 mL via INTRAVENOUS

## 2019-08-24 NOTE — Progress Notes (Signed)
PROGRESS NOTE    Jennifer Mosley  OQH:[id number] DOB: [birth year] DOA: 09/11/2019 PCP: [name], MD   Brief Narrative: Taken from H&P Jennifer Mosley is a 64 y.o. female with medical history significant for COPD, hypertension, liver cirrhosis and CHF who presents to the ER by EMS for evaluation of shortness of breath.  Patient's neighbor had called EMS because of mental status changes.  When EMS arrived patient was said to have a pulse oximetry of 48% on room air.  She was placed on CPAP with improvement in her pulse oximetry to 88%.  By the time she arrived in the ER her pulse oximetry was 77% on CPAP and patient was switched to high flow nasal cannula.  Patient tested positive for COVID-19 virus on July 31 but had symptoms for 24 hours prior to getting tested.  Other family members are sick as well.  She is unvaccinated. On arrival she had elevated inflammatory markers, lactic acidosis and elevated procalcitonin.  Chest x-ray with pulmonary vascular congestion/bilateral infiltrate.  Subjective: Patient is still coughing and short of breath.  She was on heated HFNC at [place].  Assessment & Plan:   Principal Problem:   Acute respiratory failure due to COVID-19 [facility]) Active Problems:   Essential hypertension   Morbid obesity ([place])   Pneumonia due to COVID-19 virus   Acute CHF (congestive heart failure) ([place])  Acute hypoxic respiratory failure secondary to COVID-19 pneumonia. Elevated inflammatory markers, lactic acidosis which is stable but has not resolved yet.  She was requiring higher level of oxygen and saturating barely at 90%.  Discussed about getting Actemra and she refused. Worsening D-dimer with some improvement in CRP.  Repeat chest x-ray this morning with persistent diffuse airway disease. -Get CTA -Continue remdesivir.-Day 3. -Continue Solu Medrol-day 3 -Continue supportive care. -Continue with supplemental oxygen with heated HFNC to maintain saturation above  90%. -Try proning. -Continue to monitor inflammatory markers.  Lactic acidosis.  Sepsis ruled out.  Most likely secondary to hypoxia.  Now trending down, patient did not receive IV fluid for concern of pulmonary vascular congestion. Most likely is SIRS response, she did not meet sepsis criteria completely. Blood cultures remain negative. Procalcitonin elevated at 0.74 and she was also started on ceftriaxone and azithromycin for a possible superadded bacterial infection. -Continue to monitor. -Continue with antibiotics.  History of HFpEF/pulmonary vascular congestion.  Pulmonary vascular congestion was mentioned on her chest x-ray, similar picture can be obtained with COVID-19 pneumonia.  BNP normal.  Patient does not appear volume up on exam.  She was started on Lasix 40 mg daily to keep in negative balance . -Echocardiogram with normal EF, unable to determine diastolic function and IVC with greater than 50% collapsibility which is consistent with euvolemia. -Continue Lasix as it will help suspected ARDS secondary to COVID-19.  Diabetes mellitus.  A1c at 7.6.  CBG mildly elevated. -Continue home dose of Tradjenta. -Continue with sliding scale.  Hypothyroidism. -Continue home dose of Synthroid.  Morbid obesity. Body mass index is 38.96 kg/m.  This will complicate overall prognosis.  Objective: Vitals:   08/24/19 1210 08/24/19 1212 08/24/19 1612 08/24/19 1621  BP: (!) 126/52   126/70  Pulse: (!) 54   (!) 53  Resp:    20  Temp: 97.7 F (36.5 C)   (!) 97.5 F (36.4 C)  TempSrc: Oral   Oral  SpO2: (!) 87% 92% 93% 91%  Weight:      Height:  Intake/Output Summary (Last 24 hours) at 08/24/2019 1718 Last data filed at 08/24/2019 1621 Gross per 24 hour  Intake 220 ml  Output 700 ml  Net -480 ml   Filed Weights   09/06/2019 1151 08/23/19 2053 08/24/19 0517  Weight: 108.1 kg 103.8 kg 103 kg    Examination:  General.  Well-developed, obese lady, in no acute  distress. Pulmonary.  Bilateral scattered crackles, normal respiratory effort. CV.  Regular rate and rhythm, no JVD, rub or murmur. Abdomen.  Soft, nontender, nondistended, BS positive. CNS.  Alert and oriented x3.  No focal neurologic deficit. Extremities.  Trace LE edema, no cyanosis, pulses intact and symmetrical. Psychiatry.  Judgment and insight appears normal.  DVT prophylaxis: Lovenox Code Status: Full Family Communication: Discussed with patient Disposition Plan:  Status is: Inpatient  Remains inpatient appropriate because:Inpatient level of care appropriate due to severity of illness   Dispo: The patient is from: Home              Anticipated d/c is to: Home              Anticipated d/c date is: > 3 days              Patient currently is not medically stable to d/c.   Consultants:   None  Procedures:  Antimicrobials:  Ceftriaxone Azithromycin  Data Reviewed: I have personally reviewed following labs and imaging studies  CBC: Recent Labs  Lab 08/25/2019 1140 08/23/19 0724 08/24/19 0455  WBC 7.5 10.6* 12.9*  NEUTROABS 6.0 8.8* 10.4*  HGB 15.9* 15.9* 15.8*  HCT 46.2* 45.8 45.8  MCV 92.0 89.8 91.1  PLT 153 146* 578   Basic Metabolic Panel: Recent Labs  Lab 09/02/2019 1140 08/23/19 0724 08/24/19 0455  NA 131* 134* 134*  K 4.7 5.1 4.5  CL 95* 98 98  CO2 21* 28 26  GLUCOSE 135* 128* 112*  BUN 22 21 32*  CREATININE 1.19* 0.86 0.83  CALCIUM 7.8* 7.7* 7.7*  MG  --  2.2 2.1  PHOS  --  2.8 2.5   GFR: Estimated Creatinine Clearance: 82.1 mL/min (by C-G formula based on SCr of 0.83 mg/dL). Liver Function Tests: Recent Labs  Lab 09/02/2019 1140 08/23/19 0724 08/24/19 0455  AST 82* 70* 65*  ALT 22 21 19   ALKPHOS 122 128* 141*  BILITOT 1.8* 1.3* 1.3*  PROT 6.7 6.5 6.6  ALBUMIN 2.2* 2.0* 1.9*   No results for input(s): LIPASE, AMYLASE in the last 168 hours. No results for input(s): AMMONIA in the last 168 hours. Coagulation Profile: No results for  input(s): INR, PROTIME in the last 168 hours. Cardiac Enzymes: No results for input(s): CKTOTAL, CKMB, CKMBINDEX, TROPONINI in the last 168 hours. BNP (last 3 results) No results for input(s): PROBNP in the last 8760 hours. HbA1C: Recent Labs    08/23/19 0702  HGBA1C 7.6*   CBG: Recent Labs  Lab 08/23/19 1619 08/23/19 2048 08/24/19 0829 08/24/19 1201 08/24/19 1620  GLUCAP 144* 138* 112* 249* 117*   Lipid Profile: Recent Labs    09/11/2019 1140  TRIG 137   Thyroid Function Tests: No results for input(s): TSH, T4TOTAL, FREET4, T3FREE, THYROIDAB in the last 72 hours. Anemia Panel: Recent Labs    08/23/19 0724 08/24/19 0455  FERRITIN 296 206   Sepsis Labs: Recent Labs  Lab 09/08/2019 1140 08/16/2019 1912 08/23/19 0724  PROCALCITON 0.74  --   --   LATICACIDVEN 5.9* 2.3* 2.6*    Recent Results (from the past  240 hour(s))  Blood Culture (routine x 2)     Status: None (Preliminary result)   Collection Time: 09/01/2019 11:40 AM   Specimen: BLOOD  Result Value Ref Range Status   Specimen Description BLOOD LEFT ANTECUBITAL  Final   Special Requests   Final    BOTTLES DRAWN AEROBIC AND ANAEROBIC Blood Culture results may not be optimal due to an inadequate volume of blood received in culture bottles   Culture   Final    NO GROWTH 2 DAYS Performed at [facility], [address], [place], [place] [address]    Report Status PENDING  Incomplete  Blood Culture (routine x 2)     Status: None (Preliminary result)   Collection Time: 08/18/2019 11:40 AM   Specimen: BLOOD  Result Value Ref Range Status   Specimen Description BLOOD BLOOD LEFT HAND  Final   Special Requests   Final    BOTTLES DRAWN AEROBIC AND ANAEROBIC Blood Culture adequate volume   Culture   Final    NO GROWTH 2 DAYS Performed at [facility], [address], [place], [place] [address]    Report Status PENDING  Incomplete  SARS Coronavirus 2 by RT PCR (hospital order, performed in [place] hospital lab) Nasopharyngeal Nasopharyngeal Swab     Status: Abnormal   Collection Time: 09/11/2019 11:40 AM   Specimen: Nasopharyngeal Swab  Result Value Ref Range Status   SARS Coronavirus 2 POSITIVE (A) NEGATIVE Final    Comment: RESULT CALLED TO, READ BACK BY AND VERIFIED WITH:  Verlene Mayer, RN @ [id number] ON 09/06/2019. PG. (NOTE) SARS-CoV-2 target nucleic acids are DETECTED  SARS-CoV-2 RNA is generally detectable in upper respiratory specimens  during the acute phase of infection.  Positive results are indicative  of the presence of the identified virus, but do not rule out bacterial infection or co-infection with other pathogens not detected by the test.  Clinical correlation with patient history and  other diagnostic information is necessary to determine patient infection status.  The expected result is negative.  Fact Sheet for Patients:   StrictlyIdeas.no   Fact Sheet for Healthcare Providers:   BankingDealers.co.za    This test is not yet approved or cleared by the [place] FDA and  has been authorized for detection and/or diagnosis of SARS-CoV-2 by FDA under an Emergency Use Authorization (EUA).  This EUA will remain in effect (meaning  this test can be used) for the duration of  the COVID-19 declaration under Section 564(b)(1) of the Act, 21 U.S.C. section 360-bbb-3(b)(1), unless the authorization is terminated or revoked sooner.  Performed at [facility], [address], [place],  [address]      Radiology Studies: [facility] Chest [place] 1 View  Result Date: 08/24/2019 CLINICAL DATA:  Shortness of breath. EXAM: PORTABLE CHEST 1 VIEW COMPARISON:  08/26/2019 FINDINGS: The cardiac silhouette, mediastinal and hilar contours are stable. Persistent diffuse interstitial and airspace process in the lungs. No pleural effusions. No pneumothorax. IMPRESSION: Persistent diffuse interstitial and airspace process.  Electronically Signed   By: Marijo Sanes M.D.   On: 08/24/2019 05:36   ECHOCARDIOGRAM COMPLETE  Result Date: 08/23/2019    ECHOCARDIOGRAM REPORT   Patient Name:   Jennifer Mosley Date of Exam: 08/23/2019 Medical Rec #:  [id number]          Height:       64.0 in Accession #:    [id number]         Weight:  238.3 lb Date of Birth:  1955-10-15           BSA:          2.108 m Patient Age:    45 years           BP:           138/78 mmHg Patient Gender: F                  HR:           53 bpm. Exam Location:  [facility] Procedure: 2D Echo, Cardiac Doppler and Color Doppler Indications:     CHF- acute diastolic 465.03  History:         Patient has prior history of Echocardiogram examinations, most                  recent 05/16/2015. COPD and Stroke; Risk Factors:Diabetes.  Sonographer:     Sherrie Sport RDCS (AE) Referring Phys:  [id number] Collier Bullock Diagnosing Phys: Kate Sable MD  Sonographer Comments: No apical window, no subcostal window and Technically challenging study due to limited acoustic windows. IMPRESSIONS  1. Left ventricular ejection fraction, by estimation, is 60 to 65%. The left ventricle has normal function. The left ventricle has no regional wall motion abnormalities. There is mild left ventricular hypertrophy. Left ventricular diastolic function could not be evaluated.  2. Right ventricular systolic function is normal. The right ventricular size is normal. There is normal pulmonary artery systolic pressure.  3. The mitral valve is normal in structure. No evidence of mitral valve regurgitation. No evidence of mitral stenosis.  4. The aortic valve is normal in structure. Aortic valve regurgitation is not visualized. No aortic stenosis is present.  5. The inferior vena cava is normal in size with greater than 50% respiratory variability, suggesting right atrial pressure of 3 mmHg. FINDINGS  Left Ventricle: Left ventricular ejection fraction, by estimation, is 60 to 65%. The left ventricle has normal  function. The left ventricle has no regional wall motion abnormalities. The left ventricular internal cavity size was normal in size. There is  mild left ventricular hypertrophy. Left ventricular diastolic function could not be evaluated. Right Ventricle: The right ventricular size is normal. No increase in right ventricular wall thickness. Right ventricular systolic function is normal. There is normal pulmonary artery systolic pressure. The tricuspid regurgitant velocity is 1.57 m/s, and  with an assumed right atrial pressure of 10 mmHg, the estimated right ventricular systolic pressure is 12.7 mmHg. Left Atrium: Left atrial size was normal in size. Right Atrium: Right atrial size was normal in size. Pericardium: There is no evidence of pericardial effusion. Mitral Valve: The mitral valve is normal in structure. Normal mobility of the mitral valve leaflets. No evidence of mitral valve regurgitation. No evidence of mitral valve stenosis. Tricuspid Valve: The tricuspid valve is normal in structure. Tricuspid valve regurgitation is not demonstrated. No evidence of tricuspid stenosis. Aortic Valve: The aortic valve is normal in structure. Aortic valve regurgitation is not visualized. No aortic stenosis is present. Pulmonic Valve: The pulmonic valve was not well visualized. Pulmonic valve regurgitation is not visualized. No evidence of pulmonic stenosis. Aorta: The aortic root is normal in size and structure. Venous: The inferior vena cava is normal in size with greater than 50% respiratory variability, suggesting right atrial pressure of 3 mmHg. IAS/Shunts: No atrial level shunt detected by color flow Doppler.  LEFT VENTRICLE PLAX 2D LVIDd:  3.94 cm LVIDs:         2.78 cm LV PW:         1.13 cm LV IVS:        1.25 cm LVOT diam:     2.00 cm LVOT Area:     3.14 cm  LEFT ATRIUM         Index LA diam:    3.70 cm 1.76 cm/m                        PULMONIC VALVE AORTA                 PV Vmax:        0.80 m/s Ao Root  diam: 2.50 cm PV Peak grad:   2.6 mmHg                       RVOT Peak grad: 4 mmHg  TRICUSPID VALVE TR Peak grad:   9.9 mmHg TR Vmax:        157.00 cm/s  SHUNTS Systemic Diam: 2.00 cm Kate Sable MD Electronically signed by Kate Sable MD Signature Date/Time: 08/23/2019/3:03:17 PM    Final     Scheduled Meds: . vitamin C  500 mg Oral Daily  . aspirin EC  81 mg Oral Daily  . azithromycin  500 mg Oral Daily  . enoxaparin (LOVENOX) injection  40 mg Subcutaneous Q12H  . furosemide  40 mg Intravenous Daily  . insulin aspart  0-20 Units Subcutaneous TID WC  . insulin detemir  0.15 Units/kg Subcutaneous BID  . levothyroxine  88 mcg Oral Q0600  . linagliptin  5 mg Oral Daily  . methylPREDNISolone (SOLU-MEDROL) injection  40 mg Intravenous Q12H  . nadolol  20 mg Oral Daily  . oxybutynin  10 mg Oral Daily  . pantoprazole  40 mg Oral Daily  . rosuvastatin  20 mg Oral Daily  . sodium chloride flush  3 mL Intravenous Q12H  . zinc sulfate  220 mg Oral Daily   Continuous Infusions: . sodium chloride    . sodium chloride    . cefTRIAXone (ROCEPHIN)  IV 2 g (08/24/19 1425)  . remdesivir 100 mg in NS 100 mL 100 mg (08/24/19 0952)     LOS: 2 days   Time spent: 40 minutes.  Lorella Nimrod, MD [facility]  If 7PM-7AM, please contact night-coverage [email or web address]  08/24/2019, 5:18 PM   This record has been created using Systems analyst. Errors have been sought and corrected,but may not always be located. Such creation errors do not reflect on the standard of care.

## 2019-08-25 LAB — COMPREHENSIVE METABOLIC PANEL
ALT: 16 U/L (ref 0–44)
AST: 55 U/L — ABNORMAL HIGH (ref 15–41)
Albumin: 1.8 g/dL — ABNORMAL LOW (ref 3.5–5.0)
Alkaline Phosphatase: 141 U/L — ABNORMAL HIGH (ref 38–126)
Anion gap: 8 (ref 5–15)
BUN: 38 mg/dL — ABNORMAL HIGH (ref 8–23)
CO2: 29 mmol/L (ref 22–32)
Calcium: 7.7 mg/dL — ABNORMAL LOW (ref 8.9–10.3)
Chloride: 98 mmol/L (ref 98–111)
Creatinine, Ser: 1.02 mg/dL — ABNORMAL HIGH (ref 0.44–1.00)
GFR calc Af Amer: >60 mL/min (ref >60)
GFR calc non Af Amer: 59 mL/min — ABNORMAL LOW (ref >60)
Glucose, Bld: 125 mg/dL — ABNORMAL HIGH (ref 70–99)
Potassium: 4.8 mmol/L (ref 3.5–5.1)
Sodium: 135 mmol/L (ref 135–145)
Total Bilirubin: 1.5 mg/dL — ABNORMAL HIGH (ref 0.3–1.2)
Total Protein: 6.1 g/dL — ABNORMAL LOW (ref 6.5–8.1)

## 2019-08-25 LAB — C-REACTIVE PROTEIN: CRP: 5.4 mg/dL — ABNORMAL HIGH (ref <1.0)

## 2019-08-25 LAB — CBC WITH DIFFERENTIAL/PLATELET
Abs Immature Granulocytes: 0.1 10*3/uL — ABNORMAL HIGH (ref 0.00–0.07)
Basophils Absolute: 0 10*3/uL (ref 0.0–0.1)
Basophils Relative: 0 %
Eosinophils Absolute: 0 10*3/uL (ref 0.0–0.5)
Eosinophils Relative: 0 %
HCT: 46.2 % — ABNORMAL HIGH (ref 36.0–46.0)
Hemoglobin: 15.4 g/dL — ABNORMAL HIGH (ref 12.0–15.0)
Immature Granulocytes: 1 %
Lymphocytes Relative: 12 %
Lymphs Abs: 1.2 10*3/uL (ref 0.7–4.0)
MCH: 30.8 pg (ref 26.0–34.0)
MCHC: 33.3 g/dL (ref 30.0–36.0)
MCV: 92.4 fL (ref 80.0–100.0)
Monocytes Absolute: 0.5 10*3/uL (ref 0.1–1.0)
Monocytes Relative: 5 %
Neutro Abs: 8.5 10*3/uL — ABNORMAL HIGH (ref 1.7–7.7)
Neutrophils Relative %: 82 %
Platelets: 111 10*3/uL — ABNORMAL LOW (ref 150–400)
RBC: 5 MIL/uL (ref 3.87–5.11)
RDW: 14.6 % (ref 11.5–15.5)
Smear Review: DECREASED
WBC: 10.3 10*3/uL (ref 4.0–10.5)
nRBC: 0 % (ref 0.0–0.2)

## 2019-08-25 LAB — GLUCOSE, CAPILLARY
Glucose-Capillary: 120 mg/dL — ABNORMAL HIGH (ref 70–99)
Glucose-Capillary: 249 mg/dL — ABNORMAL HIGH (ref 70–99)
Glucose-Capillary: 149 mg/dL — ABNORMAL HIGH (ref 70–99)
Glucose-Capillary: 276 mg/dL — ABNORMAL HIGH (ref 70–99)

## 2019-08-25 LAB — FIBRIN DERIVATIVES D-DIMER (ARMC ONLY): Fibrin derivatives D-dimer (ARMC): >7500 ng/mL (FEU) — ABNORMAL HIGH (ref 0.00–499.00)

## 2019-08-25 LAB — LACTIC ACID, PLASMA
Lactic Acid, Venous: 2.9 mmol/L (ref 0.5–1.9)
Lactic Acid, Venous: 3 mmol/L (ref 0.5–1.9)

## 2019-08-25 LAB — FERRITIN: Ferritin: 174 ng/mL (ref 11–307)

## 2019-08-25 LAB — PHOSPHORUS: Phosphorus: 3.3 mg/dL (ref 2.5–4.6)

## 2019-08-25 LAB — MAGNESIUM: Magnesium: 2.4 mg/dL (ref 1.7–2.4)

## 2019-08-25 MED ORDER — SODIUM CHLORIDE 0.9 % IV SOLN: INTRAVENOUS | Status: AC

## 2019-08-25 MED ORDER — ENOXAPARIN SODIUM 60 MG/0.6ML ~~LOC~~ SOLN
0.5000 mg/kg | SUBCUTANEOUS | Status: DC
  Administered 2019-08-26 – 2019-09-08 (×13): 50 mg via SUBCUTANEOUS
  Filled 2019-08-25 (×15): qty 0.6

## 2019-08-25 MED ORDER — PHENOL 1.4 % MT LIQD
1.0000 | OROMUCOSAL | Status: DC | PRN
  Administered 2019-08-25: 1 via OROMUCOSAL
  Filled 2019-08-25: qty 177

## 2019-08-25 NOTE — Progress Notes (Signed)
PROGRESS NOTE    ADONAI HELZER  KPT:[id number] DOB: 11/25/1955 DOA: 08/27/2019 PCP: [name], MD   Brief Narrative: Taken from H&P Jennifer Mosley is a 64 y.o. female with medical history significant for COPD, hypertension, liver cirrhosis and CHF who presents to the ER by EMS for evaluation of shortness of breath.  Patient's neighbor had called EMS because of mental status changes.  When EMS arrived patient was said to have a pulse oximetry of 48% on room air.  She was placed on CPAP with improvement in her pulse oximetry to 88%.  By the time she arrived in the ER her pulse oximetry was 77% on CPAP and patient was switched to high flow nasal cannula.  Patient tested positive for COVID-19 virus on July 31 but had symptoms for 24 hours prior to getting tested.  Other family members are sick as well.  She is unvaccinated. On arrival she had elevated inflammatory markers, lactic acidosis and elevated procalcitonin.  Chest x-ray with pulmonary vascular congestion/bilateral infiltrate.  Subjective: Patient has no new complaint today.  Continues to have some cough.  Appears little more comfortable today as compared to yesterday.  Assessment & Plan:   Principal Problem:   Acute respiratory failure due to COVID-19 [facility]) Active Problems:   Essential hypertension   Morbid obesity ([place])   Pneumonia due to COVID-19 virus   Acute CHF (congestive heart failure) ([place])  Acute hypoxic respiratory failure secondary to COVID-19 pneumonia. Elevated inflammatory markers, lactic acidosis which is stable but has not resolved yet.  She was requiring higher level of oxygen and saturating barely at 90%.  Discussed about getting Actemra and she refused. Worsening D-dimer with some improvement in CRP.  Repeat chest x-ray this morning with persistent diffuse airway disease. -Get CTA -Continue remdesivir.-Day 4. -Continue Solu Medrol-day 4 -Continue supportive care. -Continue with supplemental oxygen  with heated HFNC to maintain saturation above 90%. -Try proning. -Continue to monitor inflammatory markers.  Lactic acidosis.  Sepsis ruled out.  Most likely secondary to hypoxia.  Little worsening today, patient did not receive IV fluid for concern of pulmonary vascular congestion. Most likely is SIRS response, she did not meet sepsis criteria completely. Blood cultures remain negative. Procalcitonin elevated at 0.74 and she was also started on ceftriaxone and azithromycin for a possible superadded bacterial infection. -Continue with antibiotics. -Give her some gentle fluid -Continue to monitor lactic acid.  History of HFpEF/pulmonary vascular congestion.  Pulmonary vascular congestion was mentioned on her chest x-ray, similar picture can be obtained with COVID-19 pneumonia.  BNP normal.  Patient does not appear volume up on exam.  She was started on Lasix 40 mg daily to keep in negative balance . -Echocardiogram with normal EF, unable to determine diastolic function and IVC with greater than 50% collapsibility which is consistent with euvolemia. -Discontinue Lasix due to increase in her creatinine today.  Diabetes mellitus.  A1c at 7.6.  CBG mildly elevated. -Continue home dose of Tradjenta. -Continue with sliding scale.  Hypothyroidism. -Continue home dose of Synthroid.  Morbid obesity. Body mass index is 38.62 kg/m.  This will complicate overall prognosis.  Objective: Vitals:   08/25/19 0819 08/25/19 1146 08/25/19 1235 08/25/19 1421  BP: (!) 154/82 133/60    Pulse: (!) 46 (!) 44    Resp: 16 15    Temp: 97.9 F (36.6 C) 98 F (36.7 C)    TempSrc: Oral Oral    SpO2: 92% 91% 93% (!) 89%  Weight:  Height:        Intake/Output Summary (Last 24 hours) at 08/25/2019 1551 Last data filed at 08/25/2019 1500 Gross per 24 hour  Intake 1086.14 ml  Output 900 ml  Net 186.14 ml   Filed Weights   08/23/19 2053 08/24/19 0517 08/25/19 0432  Weight: 103.8 kg 103 kg 102.1 kg      Examination:  General.  Chronically ill-appearing lady, in no acute distress. Pulmonary.  Decreased air entry bilaterally, normal respiratory effort. CV.  Regular rate and rhythm, no JVD, rub or murmur. Abdomen.  Soft, nontender, nondistended, BS positive. CNS.  Alert and oriented x3.  No focal neurologic deficit. Extremities.  No edema, no cyanosis, pulses intact and symmetrical. Psychiatry.  Judgment and insight appears normal.  DVT prophylaxis: Lovenox Code Status: Full Family Communication: Discussed with patient Disposition Plan:  Status is: Inpatient  Remains inpatient appropriate because:Inpatient level of care appropriate due to severity of illness   Dispo: The patient is from: Home              Anticipated d/c is to: Home              Anticipated d/c date is: > 3 days              Patient currently is not medically stable to d/c.   Consultants:   None  Procedures:  Antimicrobials:  Ceftriaxone Azithromycin  Data Reviewed: I have personally reviewed following labs and imaging studies  CBC: Recent Labs  Lab 08/31/2019 1140 08/23/19 0724 08/24/19 0455 08/25/19 0534  WBC 7.5 10.6* 12.9* 10.3  NEUTROABS 6.0 8.8* 10.4* 8.5*  HGB 15.9* 15.9* 15.8* 15.4*  HCT 46.2* 45.8 45.8 46.2*  MCV 92.0 89.8 91.1 92.4  PLT 153 146* 158 280*   Basic Metabolic Panel: Recent Labs  Lab 08/31/2019 1140 08/23/19 0724 08/24/19 0455 08/25/19 0534  NA 131* 134* 134* 135  K 4.7 5.1 4.5 4.8  CL 95* 98 98 98  CO2 21* 28 26 29   GLUCOSE 135* 128* 112* 125*  BUN 22 21 32* 38*  CREATININE 1.19* 0.86 0.83 1.02*  CALCIUM 7.8* 7.7* 7.7* 7.7*  MG  --  2.2 2.1 2.4  PHOS  --  2.8 2.5 3.3   GFR: Estimated Creatinine Clearance: 66.5 mL/min (A) (by C-G formula based on SCr of 1.02 mg/dL (H)). Liver Function Tests: Recent Labs  Lab 09/01/2019 1140 08/23/19 0724 08/24/19 0455 08/25/19 0534  AST 82* 70* 65* 55*  ALT 22 21 19 16   ALKPHOS 122 128* 141* 141*  BILITOT 1.8* 1.3*  1.3* 1.5*  PROT 6.7 6.5 6.6 6.1*  ALBUMIN 2.2* 2.0* 1.9* 1.8*   No results for input(s): LIPASE, AMYLASE in the last 168 hours. No results for input(s): AMMONIA in the last 168 hours. Coagulation Profile: No results for input(s): INR, PROTIME in the last 168 hours. Cardiac Enzymes: No results for input(s): CKTOTAL, CKMB, CKMBINDEX, TROPONINI in the last 168 hours. BNP (last 3 results) No results for input(s): PROBNP in the last 8760 hours. HbA1C: Recent Labs    08/23/19 0702  HGBA1C 7.6*   CBG: Recent Labs  Lab 08/24/19 1201 08/24/19 1620 08/24/19 2315 08/25/19 0824 08/25/19 1148  GLUCAP 249* 117* 197* 120* 249*   Lipid Profile: No results for input(s): CHOL, HDL, LDLCALC, TRIG, CHOLHDL, LDLDIRECT in the last 72 hours. Thyroid Function Tests: No results for input(s): TSH, T4TOTAL, FREET4, T3FREE, THYROIDAB in the last 72 hours. Anemia Panel: Recent Labs    08/24/19 0455  08/25/19 0534  FERRITIN 206 174   Sepsis Labs: Recent Labs  Lab 09/03/2019 1140 08/30/2019 1912 08/24/19 1742 08/24/19 2032 08/25/19 0931 08/25/19 1215  PROCALCITON 0.74  --   --   --   --   --   LATICACIDVEN 5.9*   < > 2.3* 2.3* 2.9* 3.0*   < > = values in this interval not displayed.    Recent Results (from the past 240 hour(s))  Blood Culture (routine x 2)     Status: None (Preliminary result)   Collection Time: 08/27/2019 11:40 AM   Specimen: BLOOD  Result Value Ref Range Status   Specimen Description BLOOD LEFT ANTECUBITAL  Final   Special Requests   Final    BOTTLES DRAWN AEROBIC AND ANAEROBIC Blood Culture results may not be optimal due to an inadequate volume of blood received in culture bottles   Culture   Final    NO GROWTH 3 DAYS Performed at [facility], [address], [place], [place] [address]    Report Status PENDING  Incomplete  Blood Culture (routine x 2)     Status: None (Preliminary result)   Collection Time: 09/07/2019 11:40 AM   Specimen: BLOOD  Result Value  Ref Range Status   Specimen Description BLOOD BLOOD LEFT HAND  Final   Special Requests   Final    BOTTLES DRAWN AEROBIC AND ANAEROBIC Blood Culture adequate volume   Culture   Final    NO GROWTH 3 DAYS Performed at [facility], [address] [place], [place] [address]    Report Status PENDING  Incomplete  SARS Coronavirus 2 by RT PCR (hospital order, performed in [place] hospital lab) Nasopharyngeal Nasopharyngeal Swab     Status: Abnormal   Collection Time: 09/01/2019 11:40 AM   Specimen: Nasopharyngeal Swab  Result Value Ref Range Status   SARS Coronavirus 2 POSITIVE (A) NEGATIVE Final    Comment: RESULT CALLED TO, READ BACK BY AND VERIFIED WITH:  Verlene Mayer, RN @ 1696 ON 08/18/2019. PG. (NOTE) SARS-CoV-2 target nucleic acids are DETECTED  SARS-CoV-2 RNA is generally detectable in upper respiratory specimens  during the acute phase of infection.  Positive results are indicative  of the presence of the identified virus, but do not rule out bacterial infection or co-infection with other pathogens not detected by the test.  Clinical correlation with patient history and  other diagnostic information is necessary to determine patient infection status.  The expected result is negative.  Fact Sheet for Patients:   StrictlyIdeas.no   Fact Sheet for Healthcare Providers:   BankingDealers.co.za    This test is not yet approved or cleared by the [place] FDA and  has been authorized for detection and/or diagnosis of SARS-CoV-2 by FDA under an Emergency Use Authorization (EUA).  This EUA will remain in effect (meaning  this test can be used) for the duration of  the COVID-19 declaration under Section 564(b)(1) of the Act, 21 U.S.C. section 360-bbb-3(b)(1), unless the authorization is terminated or revoked sooner.  Performed at [facility], [address] [place], [place] [address]      Radiology  Studies: CT ANGIO CHEST PE W OR WO CONTRAST  Result Date: 08/24/2019 CLINICAL DATA:  Respiratory failure, COPD.  COVID. EXAM: CT ANGIOGRAPHY CHEST WITH CONTRAST TECHNIQUE: Multidetector CT imaging of the chest was performed using the standard protocol during bolus administration of intravenous contrast. Multiplanar CT image reconstructions and MIPs were obtained to evaluate the vascular anatomy. CONTRAST:  123mL OMNIPAQUE IOHEXOL 350 MG/ML SOLN COMPARISON:  None. FINDINGS: Cardiovascular: No filling defects in the pulmonary arteries to suggest pulmonary emboli. Cardiomegaly. Aorta normal caliber. Coronary artery and aortic calcifications. Mediastinum/Nodes: No mediastinal, hilar, or axillary adenopathy. Trachea and esophagus are unremarkable. Thyroid unremarkable. Lungs/Pleura: Extensive ground-glass opacities throughout the lungs most compatible with COVID pneumonia. No effusions. Upper Abdomen: Imaging into the upper abdomen demonstrates no acute findings. Musculoskeletal: Chest wall soft tissues are unremarkable. No acute bony abnormality. Review of the MIP images confirms the above findings. IMPRESSION: No evidence of pulmonary embolus. Extensive bilateral ground-glass airspace opacities most compatible with COVID pneumonia. Cardiomegaly, coronary artery disease. Aortic Atherosclerosis (ICD10-I70.0). Electronically Signed   By: Rolm Baptise M.D.   On: 08/24/2019 21:21   DG Chest Port 1 View  Result Date: 08/24/2019 CLINICAL DATA:  Shortness of breath. EXAM: PORTABLE CHEST 1 VIEW COMPARISON:  09/03/2019 FINDINGS: The cardiac silhouette, mediastinal and hilar contours are stable. Persistent diffuse interstitial and airspace process in the lungs. No pleural effusions. No pneumothorax. IMPRESSION: Persistent diffuse interstitial and airspace process. Electronically Signed   By: Marijo Sanes M.D.   On: 08/24/2019 05:36    Scheduled Meds: . vitamin C  500 mg Oral Daily  . aspirin EC  81 mg Oral Daily  .  azithromycin  500 mg Oral Daily  . [START ON 08/26/2019] enoxaparin (LOVENOX) injection  0.5 mg/kg Subcutaneous Q24H  . insulin aspart  0-20 Units Subcutaneous TID WC  . insulin detemir  0.15 Units/kg Subcutaneous BID  . levothyroxine  88 mcg Oral Q0600  . linagliptin  5 mg Oral Daily  . methylPREDNISolone (SOLU-MEDROL) injection  40 mg Intravenous Q12H  . nadolol  20 mg Oral Daily  . oxybutynin  10 mg Oral Daily  . pantoprazole  40 mg Oral Daily  . rosuvastatin  20 mg Oral Daily  . sodium chloride flush  3 mL Intravenous Q12H  . zinc sulfate  220 mg Oral Daily   Continuous Infusions: . sodium chloride    . cefTRIAXone (ROCEPHIN)  IV 2 g (08/25/19 1404)  . remdesivir 100 mg in NS 100 mL 100 mg (08/25/19 0851)     LOS: 3 days   Time spent: 40 minutes.  Lorella Nimrod, MD [facility]  If 7PM-7AM, please contact night-coverage [email or web address]  08/25/2019, 3:51 PM   This record has been created using Systems analyst. Errors have been sought and corrected,but may not always be located. Such creation errors do not reflect on the standard of care.

## 2019-08-25 NOTE — Care Management Important Message (Signed)
Important Message  Patient Details  Name: Jennifer Mosley MRN: [id number] Date of Birth: [birth year]   Medicare Important Message Given:  Yes  Reviewed with patient via room phone due to isolation status.  Declined receiving copy of Medicare IM at this time.     Dannette Barbara 08/25/2019, 11:34 AM

## 2019-08-26 ENCOUNTER — Inpatient Hospital Stay: Payer: Medicare Other

## 2019-08-26 LAB — PHOSPHORUS: Phosphorus: 2.7 mg/dL (ref 2.5–4.6)

## 2019-08-26 LAB — COMPREHENSIVE METABOLIC PANEL
ALT: 22 U/L (ref 0–44)
AST: 69 U/L — ABNORMAL HIGH (ref 15–41)
Albumin: 1.8 g/dL — ABNORMAL LOW (ref 3.5–5.0)
Alkaline Phosphatase: 243 U/L — ABNORMAL HIGH (ref 38–126)
Anion gap: 9 (ref 5–15)
BUN: 32 mg/dL — ABNORMAL HIGH (ref 8–23)
CO2: 27 mmol/L (ref 22–32)
Calcium: 7.9 mg/dL — ABNORMAL LOW (ref 8.9–10.3)
Chloride: 100 mmol/L (ref 98–111)
Creatinine, Ser: 0.86 mg/dL (ref 0.44–1.00)
GFR calc Af Amer: >60 mL/min (ref >60)
GFR calc non Af Amer: >60 mL/min (ref >60)
Glucose, Bld: 107 mg/dL — ABNORMAL HIGH (ref 70–99)
Potassium: 4.5 mmol/L (ref 3.5–5.1)
Sodium: 136 mmol/L (ref 135–145)
Total Bilirubin: 1.6 mg/dL — ABNORMAL HIGH (ref 0.3–1.2)
Total Protein: 6.5 g/dL (ref 6.5–8.1)

## 2019-08-26 LAB — CBC WITH DIFFERENTIAL/PLATELET
Abs Immature Granulocytes: 0.06 10*3/uL (ref 0.00–0.07)
Basophils Absolute: 0.1 10*3/uL (ref 0.0–0.1)
Basophils Relative: 1 %
Eosinophils Absolute: 0 10*3/uL (ref 0.0–0.5)
Eosinophils Relative: 0 %
HCT: 46.5 % — ABNORMAL HIGH (ref 36.0–46.0)
Hemoglobin: 16.1 g/dL — ABNORMAL HIGH (ref 12.0–15.0)
Immature Granulocytes: 1 %
Lymphocytes Relative: 7 %
Lymphs Abs: 0.7 10*3/uL (ref 0.7–4.0)
MCH: 30.9 pg (ref 26.0–34.0)
MCHC: 34.6 g/dL (ref 30.0–36.0)
MCV: 89.3 fL (ref 80.0–100.0)
Monocytes Absolute: 0.5 10*3/uL (ref 0.1–1.0)
Monocytes Relative: 5 %
Neutro Abs: 9 10*3/uL — ABNORMAL HIGH (ref 1.7–7.7)
Neutrophils Relative %: 86 %
Platelets: 113 10*3/uL — ABNORMAL LOW (ref 150–400)
RBC: 5.21 MIL/uL — ABNORMAL HIGH (ref 3.87–5.11)
RDW: 15.4 % (ref 11.5–15.5)
Smear Review: DECREASED
WBC: 10.3 10*3/uL (ref 4.0–10.5)
nRBC: 0.2 % (ref 0.0–0.2)

## 2019-08-26 LAB — FIBRIN DERIVATIVES D-DIMER (ARMC ONLY): Fibrin derivatives D-dimer (ARMC): >7500 ng/mL (FEU) — ABNORMAL HIGH (ref 0.00–499.00)

## 2019-08-26 LAB — C-REACTIVE PROTEIN: CRP: 5.3 mg/dL — ABNORMAL HIGH (ref <1.0)

## 2019-08-26 LAB — GLUCOSE, CAPILLARY
Glucose-Capillary: 99 mg/dL (ref 70–99)
Glucose-Capillary: 227 mg/dL — ABNORMAL HIGH (ref 70–99)
Glucose-Capillary: 174 mg/dL — ABNORMAL HIGH (ref 70–99)
Glucose-Capillary: 186 mg/dL — ABNORMAL HIGH (ref 70–99)

## 2019-08-26 LAB — FERRITIN: Ferritin: 157 ng/mL (ref 11–307)

## 2019-08-26 LAB — PROCALCITONIN: Procalcitonin: 0.12 ng/mL

## 2019-08-26 LAB — MAGNESIUM: Magnesium: 2.4 mg/dL (ref 1.7–2.4)

## 2019-08-26 LAB — LACTIC ACID, PLASMA
Lactic Acid, Venous: 4 mmol/L (ref 0.5–1.9)
Lactic Acid, Venous: 3.7 mmol/L (ref 0.5–1.9)

## 2019-08-26 NOTE — Progress Notes (Signed)
PROGRESS NOTE    Jennifer Mosley  VFI:[id number] DOB: 10-Feb-1955 DOA: 08/15/2019 PCP: Jennifer Merles, MD   Brief Narrative: Taken from H&P Jennifer Mosley is a 64 y.o. female with medical history significant for COPD, hypertension, liver cirrhosis and CHF who presents to the ER by EMS for evaluation of shortness of breath.  Patient's neighbor had called EMS because of mental status changes.  When EMS arrived patient was said to have a pulse oximetry of 48% on room air.  She was placed on CPAP with improvement in her pulse oximetry to 88%.  By the time she arrived in the ER her pulse oximetry was 77% on CPAP and patient was switched to high flow nasal cannula.  Patient tested positive for COVID-19 virus on July 31 but had symptoms for 24 hours prior to getting tested.  Other family members are sick as well.  She is unvaccinated. On arrival she had elevated inflammatory markers, lactic acidosis and elevated procalcitonin.  Chest x-ray with pulmonary vascular congestion/bilateral infiltrate.  Continue to require higher level of oxygen.  Subjective: Patient was still requiring a lot of oxygen.  Continue to have cough.  Appetite improving.  Asking for warm food.  Assessment & Plan:   Principal Problem:   Acute respiratory failure due to COVID-19 [facility]) Active Problems:   Essential hypertension   Morbid obesity ([place])   Pneumonia due to COVID-19 virus   Acute CHF (congestive heart failure) ([place])  Acute hypoxic respiratory failure secondary to COVID-19 pneumonia. Elevated inflammatory markers, lactic acidosis which is stable but has not resolved yet.  She was requiring higher level of oxygen and saturating barely at 90%.  Discussed about getting Actemra and she refused. Worsening D-dimer with some improvement in CRP.  Repeat chest x-ray this morning with persistent diffuse airway disease. - CTA done yesterday was without PE but extensive bilateral groundglass opacities compatible with COVID-19  pneumonia. -Continue remdesivir.-Day 5-course today. -Continue Solu Medrol-day 5 -Continue supportive care. -Continue with supplemental oxygen with heated HFNC to maintain saturation above 90%. -Try proning. -Continue to monitor inflammatory markers.  Lactic acidosis.  Sepsis ruled out.  Most likely secondary to hypoxia.  Little worsening today, patient did not receive IV fluid for concern of pulmonary vascular congestion. Most likely is SIRS response, she did not meet sepsis criteria completely. Blood cultures remain negative. Procalcitonin elevated at 0.74 and she was also started on ceftriaxone and azithromycin for a possible superadded bacterial infection. -Continue with antibiotics. -Give her some gentle fluid -Continue to monitor lactic acid.  Transaminitis.  Patient with worsening liver enzymes especially alkaline phosphatase and T bili.  Has an history of cirrhosis.  Obtained right upper quadrant ultrasound to rule out any obstruction.  Consistent with chronic cirrhosis, no acute changes and some gallbladder sludge.  History of HFpEF/pulmonary vascular congestion.  Pulmonary vascular congestion was mentioned on her chest x-ray, similar picture can be obtained with COVID-19 pneumonia.  BNP normal.  Patient does not appear volume up on exam.  She was started on Lasix 40 mg daily to keep in negative balance , Lasix was discontinued yesterday after having an increase in creatinine. -Echocardiogram with normal EF, unable to determine diastolic function and IVC with greater than 50% collapsibility which is consistent with euvolemia.  Diabetes mellitus.  A1c at 7.6.  CBG mildly elevated. -Continue home dose of Tradjenta. -Continue with sliding scale.  Hypothyroidism. -Continue home dose of Synthroid.  Morbid obesity. Body mass index is 39.31 kg/m.  This will  complicate overall prognosis.  Objective: Vitals:   08/26/19 0500 08/26/19 0817 08/26/19 1122 08/26/19 1218  BP:  137/82  (!) 144/64   Pulse:  (!) 53 (!) 50   Resp: (!) 24 20 19    Temp:  98.6 F (37 C)    TempSrc:  Oral    SpO2: 94% 92% (!) 89% (!) 88%  Weight:      Height:        Intake/Output Summary (Last 24 hours) at 08/26/2019 1427 Last data filed at 08/26/2019 0600 Gross per 24 hour  Intake 1315.15 ml  Output 950 ml  Net 365.15 ml   Filed Weights   08/24/19 0517 08/25/19 0432 08/26/19 0454  Weight: 103 kg 102.1 kg 103.9 kg    Examination:  General.  Chronically ill-appearing lady, in no acute distress. Pulmonary.  Decreased breath sounds bilaterally, normal respiratory effort. CV.  Regular rate and rhythm, no JVD, rub or murmur. Abdomen.  Soft, nontender, nondistended, BS positive. CNS.  Alert and oriented x3.  No focal neurologic deficit. Extremities.  No edema, no cyanosis, pulses intact and symmetrical. Psychiatry.  Judgment and insight appears normal.  DVT prophylaxis: Lovenox Code Status: Full Family Communication: To the sister on phone, she wants daughter-in-law to be involved, told her to discuss with her now and we will give her a call instead of her from now onward.  Need to contact daughter-in-law Jennifer Mosley for future communication. Disposition Plan:  Status is: Inpatient  Remains inpatient appropriate because:Inpatient level of care appropriate due to severity of illness   Dispo: The patient is from: Home              Anticipated d/c is to: Home              Anticipated d/c date is: > 3 days              Patient currently is not medically stable to d/c.  Patient is very high risk for deterioration and death due to her multiple comorbidities with superadded COVID-19 infection.  Continue to require higher level of oxygen.  Completed the remdesivir.  Received enough antibiotics.  Discussed with sister and we will consult palliative care on Monday.   Consultants:   None  Procedures:  Antimicrobials:  Ceftriaxone Azithromycin  Data Reviewed: I have personally  reviewed following labs and imaging studies  CBC: Recent Labs  Lab 08/25/2019 1140 08/23/19 0724 08/24/19 0455 08/25/19 0534 08/26/19 0543  WBC 7.5 10.6* 12.9* 10.3 10.3  NEUTROABS 6.0 8.8* 10.4* 8.5* 9.0*  HGB 15.9* 15.9* 15.8* 15.4* 16.1*  HCT 46.2* 45.8 45.8 46.2* 46.5*  MCV 92.0 89.8 91.1 92.4 89.3  PLT 153 146* 158 111* 284*   Basic Metabolic Panel: Recent Labs  Lab 08/16/2019 1140 08/23/19 0724 08/24/19 0455 08/25/19 0534 08/26/19 0543  NA 131* 134* 134* 135 136  K 4.7 5.1 4.5 4.8 4.5  CL 95* 98 98 98 100  CO2 21* 28 26 29 27   GLUCOSE 135* 128* 112* 125* 107*  BUN 22 21 32* 38* 32*  CREATININE 1.19* 0.86 0.83 1.02* 0.86  CALCIUM 7.8* 7.7* 7.7* 7.7* 7.9*  MG  --  2.2 2.1 2.4 2.4  PHOS  --  2.8 2.5 3.3 2.7   GFR: Estimated Creatinine Clearance: 79.7 mL/min (by C-G formula based on SCr of 0.86 mg/dL). Liver Function Tests: Recent Labs  Lab 08/26/2019 1140 08/23/19 0724 08/24/19 0455 08/25/19 0534 08/26/19 0543  AST 82* 70* 65* 55* 69*  ALT  22 21 19 16 22   ALKPHOS 122 128* 141* 141* 243*  BILITOT 1.8* 1.3* 1.3* 1.5* 1.6*  PROT 6.7 6.5 6.6 6.1* 6.5  ALBUMIN 2.2* 2.0* 1.9* 1.8* 1.8*   No results for input(s): LIPASE, AMYLASE in the last 168 hours. No results for input(s): AMMONIA in the last 168 hours. Coagulation Profile: No results for input(s): INR, PROTIME in the last 168 hours. Cardiac Enzymes: No results for input(s): CKTOTAL, CKMB, CKMBINDEX, TROPONINI in the last 168 hours. BNP (last 3 results) No results for input(s): PROBNP in the last 8760 hours. HbA1C: No results for input(s): HGBA1C in the last 72 hours. CBG: Recent Labs  Lab 08/25/19 1148 08/25/19 1616 08/25/19 2015 08/26/19 0817 08/26/19 1200  GLUCAP 249* 149* 276* 99 227*   Lipid Profile: No results for input(s): CHOL, HDL, LDLCALC, TRIG, CHOLHDL, LDLDIRECT in the last 72 hours. Thyroid Function Tests: No results for input(s): TSH, T4TOTAL, FREET4, T3FREE, THYROIDAB in the last 72  hours. Anemia Panel: Recent Labs    08/25/19 0534 08/26/19 0543  FERRITIN 174 157   Sepsis Labs: Recent Labs  Lab 09/06/2019 1140 08/26/2019 1912 08/24/19 1742 08/24/19 2032 08/25/19 0931 08/25/19 1215  PROCALCITON 0.74  --   --   --   --   --   LATICACIDVEN 5.9*   < > 2.3* 2.3* 2.9* 3.0*   < > = values in this interval not displayed.    Recent Results (from the past 240 hour(s))  Blood Culture (routine x 2)     Status: None (Preliminary result)   Collection Time: 08/30/2019 11:40 AM   Specimen: BLOOD  Result Value Ref Range Status   Specimen Description BLOOD LEFT ANTECUBITAL  Final   Special Requests   Final    BOTTLES DRAWN AEROBIC AND ANAEROBIC Blood Culture results may not be optimal due to an inadequate volume of blood received in culture bottles   Culture   Final    NO GROWTH 4 DAYS Performed at [facility], [address] [place], [place] [address]    Report Status PENDING  Incomplete  Blood Culture (routine x 2)     Status: None (Preliminary result)   Collection Time: 09/11/2019 11:40 AM   Specimen: BLOOD  Result Value Ref Range Status   Specimen Description BLOOD BLOOD LEFT HAND  Final   Special Requests   Final    BOTTLES DRAWN AEROBIC AND ANAEROBIC Blood Culture adequate volume   Culture   Final    NO GROWTH 4 DAYS Performed at [facility], [address] [place], [place] [address]    Report Status PENDING  Incomplete  SARS Coronavirus 2 by RT PCR (hospital order, performed in [place] hospital lab) Nasopharyngeal Nasopharyngeal Swab     Status: Abnormal   Collection Time: 09/02/2019 11:40 AM   Specimen: Nasopharyngeal Swab  Result Value Ref Range Status   SARS Coronavirus 2 POSITIVE (A) NEGATIVE Final    Comment: RESULT CALLED TO, READ BACK BY AND VERIFIED WITH:  Verlene Mayer, RN @ 0102 ON 09/06/2019. PG. (NOTE) SARS-CoV-2 target nucleic acids are DETECTED  SARS-CoV-2 RNA is generally detectable in upper respiratory specimens    during the acute phase of infection.  Positive results are indicative  of the presence of the identified virus, but do not rule out bacterial infection or co-infection with other pathogens not detected by the test.  Clinical correlation with patient history and  other diagnostic information is necessary to determine patient infection status.  The expected result is negative.  Fact Sheet for Patients:   StrictlyIdeas.no   Fact Sheet for Healthcare Providers:   BankingDealers.co.za    This test is not yet approved or cleared by the [place] FDA and  has been authorized for detection and/or diagnosis of SARS-CoV-2 by FDA under an Emergency Use Authorization (EUA).  This EUA will remain in effect (meaning  this test can be used) for the duration of  the COVID-19 declaration under Section 564(b)(1) of the Act, 21 U.S.C. section 360-bbb-3(b)(1), unless the authorization is terminated or revoked sooner.  Performed at [facility], [address], [place], [place] [address]      Radiology Studies: CT ANGIO CHEST PE W OR WO CONTRAST  Result Date: 08/24/2019 CLINICAL DATA:  Respiratory failure, COPD.  COVID. EXAM: CT ANGIOGRAPHY CHEST WITH CONTRAST TECHNIQUE: Multidetector CT imaging of the chest was performed using the standard protocol during bolus administration of intravenous contrast. Multiplanar CT image reconstructions and MIPs were obtained to evaluate the vascular anatomy. CONTRAST:  173mL OMNIPAQUE IOHEXOL 350 MG/ML SOLN COMPARISON:  None. FINDINGS: Cardiovascular: No filling defects in the pulmonary arteries to suggest pulmonary emboli. Cardiomegaly. Aorta normal caliber. Coronary artery and aortic calcifications. Mediastinum/Nodes: No mediastinal, hilar, or axillary adenopathy. Trachea and esophagus are unremarkable. Thyroid unremarkable. Lungs/Pleura: Extensive ground-glass opacities throughout the lungs most compatible  with COVID pneumonia. No effusions. Upper Abdomen: Imaging into the upper abdomen demonstrates no acute findings. Musculoskeletal: Chest wall soft tissues are unremarkable. No acute bony abnormality. Review of the MIP images confirms the above findings. IMPRESSION: No evidence of pulmonary embolus. Extensive bilateral ground-glass airspace opacities most compatible with COVID pneumonia. Cardiomegaly, coronary artery disease. Aortic Atherosclerosis (ICD10-I70.0). Electronically Signed   By: Rolm Baptise M.D.   On: 08/24/2019 21:21   US Abdomen Limited RUQ  Result Date: 08/26/2019 CLINICAL DATA:  Abnormal LFTs. EXAM: ULTRASOUND ABDOMEN LIMITED RIGHT UPPER QUADRANT COMPARISON:  05/03/2019 FINDINGS: Gallbladder: No gallstones or wall thickening visualized. Suggestion of mild gallbladder sludge. No sonographic Murphy sign noted by sonographer. Common bile duct: Diameter: 5.4 mm. Liver: Increased hazy parenchymal echogenicity with nodular contour suggesting a degree of cirrhosis and unchanged. No focal mass. Portal vein is patent on color Doppler imaging with normal direction of blood flow towards the liver. Other: No free fluid. IMPRESSION: 1. No acute findings. 2. Evidence of cirrhosis and unchanged. 3. Possible mild gallbladder sludge. Electronically Signed   By: Marin Olp M.D.   On: 08/26/2019 11:47    Scheduled Meds: . vitamin C  500 mg Oral Daily  . aspirin EC  81 mg Oral Daily  . enoxaparin (LOVENOX) injection  0.5 mg/kg Subcutaneous Q24H  . insulin aspart  0-20 Units Subcutaneous TID WC  . insulin detemir  0.15 Units/kg Subcutaneous BID  . levothyroxine  88 mcg Oral Q0600  . linagliptin  5 mg Oral Daily  . methylPREDNISolone (SOLU-MEDROL) injection  40 mg Intravenous Q12H  . nadolol  20 mg Oral Daily  . oxybutynin  10 mg Oral Daily  . pantoprazole  40 mg Oral Daily  . rosuvastatin  20 mg Oral Daily  . sodium chloride flush  3 mL Intravenous Q12H  . zinc sulfate  220 mg Oral Daily    Continuous Infusions: . sodium chloride    . sodium chloride 75 mL/hr at 08/26/19 1203  . cefTRIAXone (ROCEPHIN)  IV 2 g (08/26/19 1255)     LOS: 4 days   Time spent: 45 minutes.  Lorella Nimrod, MD  [facility]  If 7PM-7AM, please contact night-coverage [email or web address]  08/26/2019, 2:27 PM   This record has been created using Systems analyst. Errors have been sought and corrected,but may not always be located. Such creation errors do not reflect on the standard of care.

## 2019-08-27 LAB — CBC WITH DIFFERENTIAL/PLATELET
Abs Immature Granulocytes: 0.05 10*3/uL (ref 0.00–0.07)
Basophils Absolute: 0 10*3/uL (ref 0.0–0.1)
Basophils Relative: 0 %
Eosinophils Absolute: 0 10*3/uL (ref 0.0–0.5)
Eosinophils Relative: 0 %
HCT: 45.8 % (ref 36.0–46.0)
Hemoglobin: 15.5 g/dL — ABNORMAL HIGH (ref 12.0–15.0)
Immature Granulocytes: 1 %
Lymphocytes Relative: 4 %
Lymphs Abs: 0.4 10*3/uL — ABNORMAL LOW (ref 0.7–4.0)
MCH: 31.4 pg (ref 26.0–34.0)
MCHC: 33.8 g/dL (ref 30.0–36.0)
MCV: 92.7 fL (ref 80.0–100.0)
Monocytes Absolute: 0.4 10*3/uL (ref 0.1–1.0)
Monocytes Relative: 4 %
Neutro Abs: 9.3 10*3/uL — ABNORMAL HIGH (ref 1.7–7.7)
Neutrophils Relative %: 91 %
Platelets: 81 10*3/uL — ABNORMAL LOW (ref 150–400)
RBC: 4.94 MIL/uL (ref 3.87–5.11)
RDW: 15.5 % (ref 11.5–15.5)
Smear Review: DECREASED
WBC: 10.1 10*3/uL (ref 4.0–10.5)
nRBC: 0 % (ref 0.0–0.2)

## 2019-08-27 LAB — CULTURE, BLOOD (ROUTINE X 2)
Culture: NO GROWTH
Culture: NO GROWTH
Special Requests: ADEQUATE

## 2019-08-27 LAB — MAGNESIUM: Magnesium: 2.2 mg/dL (ref 1.7–2.4)

## 2019-08-27 LAB — FIBRIN DERIVATIVES D-DIMER (ARMC ONLY): Fibrin derivatives D-dimer (ARMC): >7500 ng/mL (FEU) — ABNORMAL HIGH (ref 0.00–499.00)

## 2019-08-27 LAB — COMPREHENSIVE METABOLIC PANEL
ALT: 25 U/L (ref 0–44)
AST: 83 U/L — ABNORMAL HIGH (ref 15–41)
Albumin: 1.7 g/dL — ABNORMAL LOW (ref 3.5–5.0)
Alkaline Phosphatase: 223 U/L — ABNORMAL HIGH (ref 38–126)
Anion gap: 9 (ref 5–15)
BUN: 26 mg/dL — ABNORMAL HIGH (ref 8–23)
CO2: 27 mmol/L (ref 22–32)
Calcium: 7.9 mg/dL — ABNORMAL LOW (ref 8.9–10.3)
Chloride: 102 mmol/L (ref 98–111)
Creatinine, Ser: 0.76 mg/dL (ref 0.44–1.00)
GFR calc Af Amer: >60 mL/min (ref >60)
GFR calc non Af Amer: >60 mL/min (ref >60)
Glucose, Bld: 107 mg/dL — ABNORMAL HIGH (ref 70–99)
Potassium: 4.5 mmol/L (ref 3.5–5.1)
Sodium: 138 mmol/L (ref 135–145)
Total Bilirubin: 1.5 mg/dL — ABNORMAL HIGH (ref 0.3–1.2)
Total Protein: 6.1 g/dL — ABNORMAL LOW (ref 6.5–8.1)

## 2019-08-27 LAB — GLUCOSE, CAPILLARY
Glucose-Capillary: 110 mg/dL — ABNORMAL HIGH (ref 70–99)
Glucose-Capillary: 299 mg/dL — ABNORMAL HIGH (ref 70–99)
Glucose-Capillary: 201 mg/dL — ABNORMAL HIGH (ref 70–99)
Glucose-Capillary: 161 mg/dL — ABNORMAL HIGH (ref 70–99)

## 2019-08-27 LAB — FERRITIN: Ferritin: 168 ng/mL (ref 11–307)

## 2019-08-27 LAB — PHOSPHORUS: Phosphorus: 3.3 mg/dL (ref 2.5–4.6)

## 2019-08-27 LAB — LACTIC ACID, PLASMA
Lactic Acid, Venous: 4.4 mmol/L (ref 0.5–1.9)
Lactic Acid, Venous: 3.3 mmol/L (ref 0.5–1.9)

## 2019-08-27 LAB — C-REACTIVE PROTEIN: CRP: 6.6 mg/dL — ABNORMAL HIGH (ref <1.0)

## 2019-08-27 LAB — PROCALCITONIN: Procalcitonin: 0.13 ng/mL

## 2019-08-27 NOTE — Plan of Care (Signed)
  Problem: Education: Goal: Knowledge of General Education information will improve Description: Including pain rating scale, medication(s)/side effects and non-pharmacologic comfort measures Outcome: Progressing   Problem: Safety: Goal: Ability to remain free from injury will improve Outcome: Progressing   Problem: Clinical Measurements: Goal: Respiratory complications will improve Outcome: Not Progressing Note: Still on HFNC; Get very SOB on exertion

## 2019-08-27 NOTE — Progress Notes (Addendum)
PROGRESS NOTE    Jennifer Mosley  RCV:[id number] DOB: 01/04/1956 DOA: 08/14/2019 PCP: [name], MD   Brief Narrative: Taken from H&P Jennifer Mosley is a 64 y.o. female with medical history significant for COPD, hypertension, liver cirrhosis and CHF who presents to the ER by EMS for evaluation of shortness of breath.  Patient's neighbor had called EMS because of mental status changes.  When EMS arrived patient was said to have a pulse oximetry of 48% on room air.  She was placed on CPAP with improvement in her pulse oximetry to 88%.  By the time she arrived in the ER her pulse oximetry was 77% on CPAP and patient was switched to high flow nasal cannula.  Patient tested positive for COVID-19 virus on July 31 but had symptoms for 24 hours prior to getting tested.  Other family members are sick as well.  She is unvaccinated. On arrival she had elevated inflammatory markers, lactic acidosis and elevated procalcitonin.  Chest x-ray with pulmonary vascular congestion/bilateral infiltrate.  Continue to require higher level of oxygen. Completed the course of remdesivir.  Refused Actemra.  Subjective: Patient was feeling very weak and lethargic.  Continue to require high level of oxygen.  Assessment & Plan:   Principal Problem:   Acute respiratory failure due to COVID-19 [facility]) Active Problems:   Essential hypertension   Morbid obesity ([place])   Pneumonia due to COVID-19 virus   Acute CHF (congestive heart failure) ([place])  Acute hypoxic respiratory failure secondary to COVID-19 pneumonia. Elevated inflammatory markers, lactic acidosis which is stable but has not resolved yet.  She was requiring higher level of oxygen and saturating barely at 90%.  Refused Actemra. Worsening D-dimer with stable CRP.  Repeat chest x-ray with persistent diffuse airway disease. CTA  without PE but extensive bilateral groundglass opacities compatible with COVID-19 pneumonia. Completed the course of  remdesivir. -Continue Solu Medrol-day 6 -Continue supportive care. -Continue with supplemental oxygen with heated HFNC to maintain saturation above 90%. -Try proning. -Continue to monitor inflammatory markers.  Lactic acidosis.  Sepsis ruled out.  Persistent lactic acidosis.  Most likely secondary to hypoxia, and with liver cirrhosis. Most likely is SIRS response, she did not meet sepsis criteria completely. Blood cultures remain negative. Procalcitonin elevated at 0.74 and she was also received ceftriaxone and azithromycin for a possible superadded bacterial infection. -Continue with ceftriaxone for another day, completed a course of Zithromax. -Give her some gentle fluid -Continue to monitor lactic acid.  Transaminitis.  Patient with stable liver enzymes especially alkaline phosphatase and T bili.  Has an history of cirrhosis.  Obtained right upper quadrant ultrasound to rule out any obstruction.  Consistent with chronic cirrhosis, no acute changes and some gallbladder sludge.  History of HFpEF/pulmonary vascular congestion.  Pulmonary vascular congestion was mentioned on her chest x-ray, similar picture can be obtained with COVID-19 pneumonia.  BNP normal.  Patient does not appear volume up on exam.  She was started on Lasix 40 mg daily to keep in negative balance , Lasix was discontinued after having an increase in creatinine. -Echocardiogram with normal EF, unable to determine diastolic function and IVC with greater than 50% collapsibility which is consistent with euvolemia.  Diabetes mellitus.  A1c at 7.6.  CBG mildly elevated. -Continue home dose of Tradjenta. -Continue with sliding scale.  Hypothyroidism. -Continue home dose of Synthroid.  Morbid obesity. Body mass index is 39.31 kg/m.  This will complicate overall prognosis.  Objective: Vitals:   08/27/19 0518 08/27/19  0820 08/27/19 0849 08/27/19 1151  BP: (!) 146/69 (!) 163/62  (!) 124/59  Pulse: (!) 44 (!) 48  (!) 47   Resp:  19  18  Temp: 98.8 F (37.1 C) 97.8 F (36.6 C)  98.2 F (36.8 C)  TempSrc: Oral Oral    SpO2: 93% 90% 95% 91%  Weight:      Height:        Intake/Output Summary (Last 24 hours) at 08/27/2019 1358 Last data filed at 08/27/2019 1229 Gross per 24 hour  Intake 362.81 ml  Output 800 ml  Net -437.19 ml   Filed Weights   08/24/19 0517 08/25/19 0432 08/26/19 0454  Weight: 103 kg 102.1 kg 103.9 kg    Examination:  General.  Lethargic lady, in no acute distress. Pulmonary.  Lungs clear bilaterally, normal respiratory effort. CV.  Regular rate and rhythm, no JVD, rub or murmur. Abdomen.  Soft, nontender, nondistended, BS positive. CNS.  Alert and oriented x3.  No focal neurologic deficit. Extremities.  No edema, no cyanosis, pulses intact and symmetrical. Psychiatry.  Judgment and insight appears normal.  DVT prophylaxis: Lovenox Code Status: Full Family Communication: Discussed with daughter-in-law on phone. Disposition Plan:  Status is: Inpatient  Remains inpatient appropriate because:Inpatient level of care appropriate due to severity of illness   Dispo: The patient is from: Home              Anticipated d/c is to: Home              Anticipated d/c date is: > 3 days              Patient currently is not medically stable to d/c.  Patient is very high risk for deterioration and death due to her multiple comorbidities with superadded COVID-19 infection.  Continue to require higher level of oxygen.  Completed the remdesivir.  Received enough antibiotics.  Discussed with sister and we will consult palliative care on Monday.  Consultants:   Palliative care,  Procedures:  Antimicrobials:  Ceftriaxone  Data Reviewed: I have personally reviewed following labs and imaging studies  CBC: Recent Labs  Lab 08/23/19 0724 08/24/19 0455 08/25/19 0534 08/26/19 0543 08/27/19 0629  WBC 10.6* 12.9* 10.3 10.3 10.1  NEUTROABS 8.8* 10.4* 8.5* 9.0* 9.3*  HGB 15.9* 15.8*  15.4* 16.1* 15.5*  HCT 45.8 45.8 46.2* 46.5* 45.8  MCV 89.8 91.1 92.4 89.3 92.7  PLT 146* 158 111* 113* 81*   Basic Metabolic Panel: Recent Labs  Lab 08/23/19 0724 08/24/19 0455 08/25/19 0534 08/26/19 0543 08/27/19 0629  NA 134* 134* 135 136 138  K 5.1 4.5 4.8 4.5 4.5  CL 98 98 98 100 102  CO2 28 26 29 27 27   GLUCOSE 128* 112* 125* 107* 107*  BUN 21 32* 38* 32* 26*  CREATININE 0.86 0.83 1.02* 0.86 0.76  CALCIUM 7.7* 7.7* 7.7* 7.9* 7.9*  MG 2.2 2.1 2.4 2.4 2.2  PHOS 2.8 2.5 3.3 2.7 3.3   GFR: Estimated Creatinine Clearance: 85.6 mL/min (by C-G formula based on SCr of 0.76 mg/dL). Liver Function Tests: Recent Labs  Lab 08/23/19 0724 08/24/19 0455 08/25/19 0534 08/26/19 0543 08/27/19 0629  AST 70* 65* 55* 69* 83*  ALT 21 19 16 22 25   ALKPHOS 128* 141* 141* 243* 223*  BILITOT 1.3* 1.3* 1.5* 1.6* 1.5*  PROT 6.5 6.6 6.1* 6.5 6.1*  ALBUMIN 2.0* 1.9* 1.8* 1.8* 1.7*   No results for input(s): LIPASE, AMYLASE in the last 168 hours. No results  for input(s): AMMONIA in the last 168 hours. Coagulation Profile: No results for input(s): INR, PROTIME in the last 168 hours. Cardiac Enzymes: No results for input(s): CKTOTAL, CKMB, CKMBINDEX, TROPONINI in the last 168 hours. BNP (last 3 results) No results for input(s): PROBNP in the last 8760 hours. HbA1C: No results for input(s): HGBA1C in the last 72 hours. CBG: Recent Labs  Lab 08/26/19 1200 08/26/19 1639 08/26/19 2035 08/27/19 0818 08/27/19 1149  GLUCAP 227* 174* 186* 110* 299*   Lipid Profile: No results for input(s): CHOL, HDL, LDLCALC, TRIG, CHOLHDL, LDLDIRECT in the last 72 hours. Thyroid Function Tests: No results for input(s): TSH, T4TOTAL, FREET4, T3FREE, THYROIDAB in the last 72 hours. Anemia Panel: Recent Labs    08/26/19 0543 08/27/19 0629  FERRITIN 157 168   Sepsis Labs: Recent Labs  Lab 09/10/2019 1140 08/18/2019 1912 08/25/19 0931 08/25/19 1215 08/26/19 1439 08/26/19 1712 08/27/19 0629   PROCALCITON 0.74  --   --   --  0.12  --  0.13  LATICACIDVEN 5.9*   < > 2.9* 3.0* 4.0* 3.7*  --    < > = values in this interval not displayed.    Recent Results (from the past 240 hour(s))  Blood Culture (routine x 2)     Status: None   Collection Time: 09/12/2019 11:40 AM   Specimen: BLOOD  Result Value Ref Range Status   Specimen Description BLOOD LEFT ANTECUBITAL  Final   Special Requests   Final    BOTTLES DRAWN AEROBIC AND ANAEROBIC Blood Culture results may not be optimal due to an inadequate volume of blood received in culture bottles   Culture   Final    NO GROWTH 5 DAYS Performed at [facility], [address] [place], [place] [address]    Report Status 08/27/2019 FINAL  Final  Blood Culture (routine x 2)     Status: None   Collection Time: 08/27/2019 11:40 AM   Specimen: BLOOD  Result Value Ref Range Status   Specimen Description BLOOD BLOOD LEFT HAND  Final   Special Requests   Final    BOTTLES DRAWN AEROBIC AND ANAEROBIC Blood Culture adequate volume   Culture   Final    NO GROWTH 5 DAYS Performed at [facility], [address] [place], [place] [address]    Report Status 08/27/2019 FINAL  Final  SARS Coronavirus 2 by RT PCR (hospital order, performed in [facility] hospital lab) Nasopharyngeal Nasopharyngeal Swab     Status: Abnormal   Collection Time: 08/29/2019 11:40 AM   Specimen: Nasopharyngeal Swab  Result Value Ref Range Status   SARS Coronavirus 2 POSITIVE (A) NEGATIVE Final    Comment: RESULT CALLED TO, READ BACK BY AND VERIFIED WITH:  Verlene Mayer, RN @ 5631 ON 09/06/2019. PG. (NOTE) SARS-CoV-2 target nucleic acids are DETECTED  SARS-CoV-2 RNA is generally detectable in upper respiratory specimens  during the acute phase of infection.  Positive results are indicative  of the presence of the identified virus, but do not rule out bacterial infection or co-infection with other pathogens not detected by the test.  Clinical correlation  with patient history and  other diagnostic information is necessary to determine patient infection status.  The expected result is negative.  Fact Sheet for Patients:   StrictlyIdeas.no   Fact Sheet for Healthcare Providers:   BankingDealers.co.za    This test is not yet approved or cleared by the [place] FDA and  has been authorized for detection and/or  diagnosis of SARS-CoV-2 by FDA under an Emergency Use Authorization (EUA).  This EUA will remain in effect (meaning  this test can be used) for the duration of  the COVID-19 declaration under Section 564(b)(1) of the Act, 21 U.S.C. section 360-bbb-3(b)(1), unless the authorization is terminated or revoked sooner.  Performed at [facility], [address] [place], [place] [address]      Radiology Studies: US Abdomen Limited RUQ  Result Date: 08/26/2019 CLINICAL DATA:  Abnormal LFTs. EXAM: ULTRASOUND ABDOMEN LIMITED RIGHT UPPER QUADRANT COMPARISON:  05/03/2019 FINDINGS: Gallbladder: No gallstones or wall thickening visualized. Suggestion of mild gallbladder sludge. No sonographic Murphy sign noted by sonographer. Common bile duct: Diameter: 5.4 mm. Liver: Increased hazy parenchymal echogenicity with nodular contour suggesting a degree of cirrhosis and unchanged. No focal mass. Portal vein is patent on color Doppler imaging with normal direction of blood flow towards the liver. Other: No free fluid. IMPRESSION: 1. No acute findings. 2. Evidence of cirrhosis and unchanged. 3. Possible mild gallbladder sludge. Electronically Signed   By: Marin Olp M.D.   On: 08/26/2019 11:47    Scheduled Meds: . vitamin C  500 mg Oral Daily  . aspirin EC  81 mg Oral Daily  . enoxaparin (LOVENOX) injection  0.5 mg/kg Subcutaneous Q24H  . insulin aspart  0-20 Units Subcutaneous TID WC  . insulin detemir  0.15 Units/kg Subcutaneous BID  . levothyroxine  88 mcg Oral Q0600  . linagliptin  5  mg Oral Daily  . methylPREDNISolone (SOLU-MEDROL) injection  40 mg Intravenous Q12H  . nadolol  20 mg Oral Daily  . oxybutynin  10 mg Oral Daily  . pantoprazole  40 mg Oral Daily  . rosuvastatin  20 mg Oral Daily  . sodium chloride flush  3 mL Intravenous Q12H  . zinc sulfate  220 mg Oral Daily   Continuous Infusions: . sodium chloride    . cefTRIAXone (ROCEPHIN)  IV 2 g (08/27/19 1231)     LOS: 5 days   Time spent: 40 minutes.  Lorella Nimrod, MD [facility]  If 7PM-7AM, please contact night-coverage [email or web address]  08/27/2019, 1:58 PM   This record has been created using Systems analyst. Errors have been sought and corrected,but may not always be located. Such creation errors do not reflect on the standard of care.

## 2019-08-27 NOTE — Plan of Care (Signed)
  Problem: Education: Goal: Knowledge of General Education information will improve Description: Including pain rating scale, medication(s)/side effects and non-pharmacologic comfort measures Outcome: Progressing   Problem: Clinical Measurements: Goal: Will remain free from infection Outcome: Progressing   Problem: Safety: Goal: Ability to remain free from injury will improve Outcome: Progressing   

## 2019-08-28 LAB — COMPREHENSIVE METABOLIC PANEL
ALT: 25 U/L (ref 0–44)
AST: 78 U/L — ABNORMAL HIGH (ref 15–41)
Albumin: 1.7 g/dL — ABNORMAL LOW (ref 3.5–5.0)
Alkaline Phosphatase: 218 U/L — ABNORMAL HIGH (ref 38–126)
Anion gap: 11 (ref 5–15)
BUN: 27 mg/dL — ABNORMAL HIGH (ref 8–23)
CO2: 24 mmol/L (ref 22–32)
Calcium: 8 mg/dL — ABNORMAL LOW (ref 8.9–10.3)
Chloride: 101 mmol/L (ref 98–111)
Creatinine, Ser: 0.73 mg/dL (ref 0.44–1.00)
GFR calc Af Amer: >60 mL/min (ref >60)
GFR calc non Af Amer: >60 mL/min (ref >60)
Glucose, Bld: 104 mg/dL — ABNORMAL HIGH (ref 70–99)
Potassium: 4.9 mmol/L (ref 3.5–5.1)
Sodium: 136 mmol/L (ref 135–145)
Total Bilirubin: 1.7 mg/dL — ABNORMAL HIGH (ref 0.3–1.2)
Total Protein: 6.4 g/dL — ABNORMAL LOW (ref 6.5–8.1)

## 2019-08-28 LAB — CBC
HCT: 47.5 % — ABNORMAL HIGH (ref 36.0–46.0)
Hemoglobin: 15.9 g/dL — ABNORMAL HIGH (ref 12.0–15.0)
MCH: 30.9 pg (ref 26.0–34.0)
MCHC: 33.5 g/dL (ref 30.0–36.0)
MCV: 92.4 fL (ref 80.0–100.0)
Platelets: 94 10*3/uL — ABNORMAL LOW (ref 150–400)
RBC: 5.14 MIL/uL — ABNORMAL HIGH (ref 3.87–5.11)
RDW: 15.2 % (ref 11.5–15.5)
WBC: 13.2 10*3/uL — ABNORMAL HIGH (ref 4.0–10.5)
nRBC: 0 % (ref 0.0–0.2)

## 2019-08-28 LAB — FIBRIN DERIVATIVES D-DIMER (ARMC ONLY): Fibrin derivatives D-dimer (ARMC): 7230.15 ng/mL (FEU) — ABNORMAL HIGH (ref 0.00–499.00)

## 2019-08-28 LAB — GLUCOSE, CAPILLARY
Glucose-Capillary: 101 mg/dL — ABNORMAL HIGH (ref 70–99)
Glucose-Capillary: 105 mg/dL — ABNORMAL HIGH (ref 70–99)
Glucose-Capillary: 177 mg/dL — ABNORMAL HIGH (ref 70–99)
Glucose-Capillary: 151 mg/dL — ABNORMAL HIGH (ref 70–99)

## 2019-08-28 LAB — C-REACTIVE PROTEIN: CRP: 9.8 mg/dL — ABNORMAL HIGH (ref <1.0)

## 2019-08-28 LAB — PROCALCITONIN: Procalcitonin: 0.19 ng/mL

## 2019-08-28 MED ORDER — HYDRALAZINE HCL 20 MG/ML IJ SOLN
10.0000 mg | Freq: Four times a day (QID) | INTRAMUSCULAR | Status: DC | PRN
  Administered 2019-08-28: 10 mg via INTRAVENOUS
  Filled 2019-08-28: qty 1

## 2019-08-28 MED ORDER — ENALAPRIL MALEATE 20 MG PO TABS
20.0000 mg | ORAL_TABLET | Freq: Every day | ORAL | Status: DC
  Administered 2019-08-28 – 2019-08-31 (×4): 20 mg via ORAL
  Filled 2019-08-28 (×4): qty 1

## 2019-08-28 MED ORDER — LACTATED RINGERS IV SOLN: INTRAVENOUS | Status: DC

## 2019-08-28 NOTE — Progress Notes (Signed)
[facility] made phone call to pt. per COVID precautions; pt. answered the phone but does not make any needs known at this time.  [place] remains available.

## 2019-08-28 NOTE — Progress Notes (Addendum)
PROGRESS NOTE    Jennifer Mosley  EGB:[id number] DOB: 09/05/1955 DOA: 09/05/2019 PCP: [name], MD   Brief Narrative: Taken from H&P Jennifer Mosley is a 64 y.o. female with medical history significant for COPD, hypertension, liver cirrhosis and CHF who presents to the ER by EMS for evaluation of shortness of breath.  Patient's neighbor had called EMS because of mental status changes.  When EMS arrived patient was said to have a pulse oximetry of 48% on room air.  She was placed on CPAP with improvement in her pulse oximetry to 88%.  By the time she arrived in the ER her pulse oximetry was 77% on CPAP and patient was switched to high flow nasal cannula.  Patient tested positive for COVID-19 virus on July 31 but had symptoms for 24 hours prior to getting tested.  Other family members are sick as well.  She is unvaccinated. On arrival she had elevated inflammatory markers, lactic acidosis and elevated procalcitonin.  Chest x-ray with pulmonary vascular congestion/bilateral infiltrate.  Continue to require higher level of oxygen. Completed the course of remdesivir.  Refused Actemra.  Subjective: Patient has no new complaint.  Appears very lethargic, hardly saturating in mid 80s on maximum setting of heated HFNC.  Assessment & Plan:   Principal Problem:   Acute respiratory failure due to COVID-19 [facility]) Active Problems:   Essential hypertension   Morbid obesity ([place])   Pneumonia due to COVID-19 virus   Acute CHF (congestive heart failure) ([place])  Acute hypoxic respiratory failure secondary to COVID-19 pneumonia. Elevated inflammatory markers, lactic acidosis which is stable but has not resolved yet.  She was requiring higher level of oxygen and saturating barely at mid to high 80s.  Refused Actemra. Today mild improvement in D-dimer but CRP worsening.  Procalcitonin at 0.19 from 0.13 today.Marland Kitchen CTA  without PE but extensive bilateral groundglass opacities compatible with COVID-19  pneumonia. Completed the course of remdesivir. -Continue Solu Medrol-day 6 -Continue supportive care. -Continue with supplemental oxygen with heated HFNC to maintain saturation above 90%. -Try proning. -Continue to monitor inflammatory markers.  Lactic acidosis.  Sepsis ruled out.  Persistent lactic acidosis.  Most likely secondary to hypoxia, and with liver cirrhosis. Most likely is SIRS response, she did not meet sepsis criteria completely. Blood cultures remain negative. Procalcitonin elevated at 0.74 and she was also received ceftriaxone and azithromycin for a possible superadded bacterial infection. -Completed a course of antibiotics with Zithromax and ceftriaxone. -Give her some gentle fluid  Transaminitis.  Mild worsening in T bili with stable alkaline phosphatase.  Has an history of cirrhosis.  Obtained right upper quadrant ultrasound to rule out any obstruction.  Consistent with chronic cirrhosis, no acute changes and some gallbladder sludge.  History of HFpEF/pulmonary vascular congestion.  Pulmonary vascular congestion was mentioned on her chest x-ray, similar picture can be obtained with COVID-19 pneumonia.  BNP normal.  Patient does not appear volume up on exam.  She was started on Lasix 40 mg daily to keep in negative balance , Lasix was discontinued after having an increase in creatinine. -Echocardiogram with normal EF, unable to determine diastolic function and IVC with greater than 50% collapsibility which is consistent with euvolemia.  Diabetes mellitus.  A1c at 7.6.  CBG mildly elevated. -Continue home dose of Tradjenta. -Continue with sliding scale.  Bradycardia and hypertension.  Remain asymptomatic -Discontinue nadolol. -Start her on home enalapril for hypertension.  Hypothyroidism. -Continue home dose of Synthroid.  Morbid obesity. Body mass index is  39.82 kg/m.  This will complicate overall prognosis.  Palliative care consult.  Palliative care was  consulted as patient is very high risk for deterioration and death.  Tried discussing with patient and family but she remains full code.  Patient has multiple underlying comorbidities along with recent COVID-19 pneumonia, worsening hypoxia on maximum setting of heated HFNC. -If patient and family decided to remain full code she is high risk for intubation. -Discussed with pulmonary-waiting for palliative care note.  Objective: Vitals:   08/28/19 0436 08/28/19 0535 08/28/19 0804 08/28/19 1209  BP: (!) 180/87 (!) 185/79 (!) 158/74 (!) 157/81  Pulse: (!) 48 (!) 46 (!) 53 (!) 58  Resp:   19 18  Temp: 98 F (36.7 C) 98.4 F (36.9 C) 97.6 F (36.4 C) 98 F (36.7 C)  TempSrc: Oral Oral Oral Oral  SpO2: (!) 85%  91% (!) 86%  Weight:  105.2 kg    Height:        Intake/Output Summary (Last 24 hours) at 08/28/2019 1507 Last data filed at 08/28/2019 0955 Gross per 24 hour  Intake 3 ml  Output 951 ml  Net -948 ml   Filed Weights   08/25/19 0432 08/26/19 0454 08/28/19 0535  Weight: 102.1 kg 103.9 kg 105.2 kg    Examination:  General.  Chronically ill-appearing, lethargic lady, in no acute distress. Pulmonary.  Lungs clear bilaterally with decreased air entry. CV.  Regular rate and rhythm, no JVD, rub or murmur. Abdomen.  Soft, nontender, nondistended, BS positive. CNS.  Alert and oriented x3.  No focal neurologic deficit. Extremities.  No edema, no cyanosis, pulses intact and symmetrical. Psychiatry.  Judgment and insight appears impaired.  DVT prophylaxis: Lovenox Code Status: Full Family Communication: Discussed with daughter-in-law on phone. Disposition Plan:  Status is: Inpatient  Remains inpatient appropriate because:Inpatient level of care appropriate due to severity of illness   Dispo: The patient is from: Home              Anticipated d/c is to: Home              Anticipated d/c date is: > 3 days              Patient currently is not medically stable to d/c.  Patient is  very high risk for deterioration and death due to her multiple comorbidities with superadded COVID-19 infection.  Continue to require higher level of oxygen.  Completed the remdesivir.  Received enough antibiotics.  Discussed with pulmonary, she is very high risk for intubation if remain full code. Awaiting palliative care consult today.  Consultants:   Palliative care,  Procedures:  Antimicrobials:  Ceftriaxone  Data Reviewed: I have personally reviewed following labs and imaging studies  CBC: Recent Labs  Lab 08/23/19 0724 08/23/19 0724 08/24/19 0455 08/25/19 0534 08/26/19 0543 08/27/19 0629 08/28/19 0651  WBC 10.6*   < > 12.9* 10.3 10.3 10.1 13.2*  NEUTROABS 8.8*  --  10.4* 8.5* 9.0* 9.3*  --   HGB 15.9*   < > 15.8* 15.4* 16.1* 15.5* 15.9*  HCT 45.8   < > 45.8 46.2* 46.5* 45.8 47.5*  MCV 89.8   < > 91.1 92.4 89.3 92.7 92.4  PLT 146*   < > 158 111* 113* 81* 94*   < > = values in this interval not displayed.   Basic Metabolic Panel: Recent Labs  Lab 08/23/19 0724 08/23/19 0724 08/24/19 0455 08/25/19 0534 08/26/19 0543 08/27/19 0629 08/28/19 0651  NA 134*   < >  134* 135 136 138 136  K 5.1   < > 4.5 4.8 4.5 4.5 4.9  CL 98   < > 98 98 100 102 101  CO2 28   < > 26 29 27 27 24   GLUCOSE 128*   < > 112* 125* 107* 107* 104*  BUN 21   < > 32* 38* 32* 26* 27*  CREATININE 0.86   < > 0.83 1.02* 0.86 0.76 0.73  CALCIUM 7.7*   < > 7.7* 7.7* 7.9* 7.9* 8.0*  MG 2.2  --  2.1 2.4 2.4 2.2  --   PHOS 2.8  --  2.5 3.3 2.7 3.3  --    < > = values in this interval not displayed.   GFR: Estimated Creatinine Clearance: 86.2 mL/min (by C-G formula based on SCr of 0.73 mg/dL). Liver Function Tests: Recent Labs  Lab 08/24/19 0455 08/25/19 0534 08/26/19 0543 08/27/19 0629 08/28/19 0651  AST 65* 55* 69* 83* 78*  ALT 19 16 22 25 25   ALKPHOS 141* 141* 243* 223* 218*  BILITOT 1.3* 1.5* 1.6* 1.5* 1.7*  PROT 6.6 6.1* 6.5 6.1* 6.4*  ALBUMIN 1.9* 1.8* 1.8* 1.7* 1.7*   No results for  input(s): LIPASE, AMYLASE in the last 168 hours. No results for input(s): AMMONIA in the last 168 hours. Coagulation Profile: No results for input(s): INR, PROTIME in the last 168 hours. Cardiac Enzymes: No results for input(s): CKTOTAL, CKMB, CKMBINDEX, TROPONINI in the last 168 hours. BNP (last 3 results) No results for input(s): PROBNP in the last 8760 hours. HbA1C: No results for input(s): HGBA1C in the last 72 hours. CBG: Recent Labs  Lab 08/27/19 1149 08/27/19 1735 08/27/19 2051 08/28/19 0805 08/28/19 1205  GLUCAP 299* 201* 161* 101* 105*   Lipid Profile: No results for input(s): CHOL, HDL, LDLCALC, TRIG, CHOLHDL, LDLDIRECT in the last 72 hours. Thyroid Function Tests: No results for input(s): TSH, T4TOTAL, FREET4, T3FREE, THYROIDAB in the last 72 hours. Anemia Panel: Recent Labs    08/26/19 0543 08/27/19 0629  FERRITIN 157 168   Sepsis Labs: Recent Labs  Lab 09/07/2019 1140 09/02/2019 1912 08/26/19 1439 08/26/19 1712 08/27/19 0629 08/27/19 1517 08/27/19 1838 08/28/19 0651  PROCALCITON 0.74  --  0.12  --  0.13  --   --  0.19  LATICACIDVEN 5.9*   < > 4.0* 3.7*  --  4.4* 3.3*  --    < > = values in this interval not displayed.    Recent Results (from the past 240 hour(s))  Blood Culture (routine x 2)     Status: None   Collection Time: 08/13/2019 11:40 AM   Specimen: BLOOD  Result Value Ref Range Status   Specimen Description BLOOD LEFT ANTECUBITAL  Final   Special Requests   Final    BOTTLES DRAWN AEROBIC AND ANAEROBIC Blood Culture results may not be optimal due to an inadequate volume of blood received in culture bottles   Culture   Final    NO GROWTH 5 DAYS Performed at [facility], [address], [place], [place] [address]    Report Status 08/27/2019 FINAL  Final  Blood Culture (routine x 2)     Status: None   Collection Time: 08/19/2019 11:40 AM   Specimen: BLOOD  Result Value Ref Range Status   Specimen Description BLOOD BLOOD LEFT HAND   Final   Special Requests   Final    BOTTLES DRAWN AEROBIC AND ANAEROBIC Blood Culture adequate volume   Culture  Final    NO GROWTH 5 DAYS Performed at [facility], [address] [place], [place] [address]    Report Status 08/27/2019 FINAL  Final  SARS Coronavirus 2 by RT PCR (hospital order, performed in [facility] hospital lab) Nasopharyngeal Nasopharyngeal Swab     Status: Abnormal   Collection Time: 08/18/2019 11:40 AM   Specimen: Nasopharyngeal Swab  Result Value Ref Range Status   SARS Coronavirus 2 POSITIVE (A) NEGATIVE Final    Comment: RESULT CALLED TO, READ BACK BY AND VERIFIED WITH:  Verlene Mayer, RN @ [phone] ON 08/13/2019. PG. (NOTE) SARS-CoV-2 target nucleic acids are DETECTED  SARS-CoV-2 RNA is generally detectable in upper respiratory specimens  during the acute phase of infection.  Positive results are indicative  of the presence of the identified virus, but do not rule out bacterial infection or co-infection with other pathogens not detected by the test.  Clinical correlation with patient history and  other diagnostic information is necessary to determine patient infection status.  The expected result is negative.  Fact Sheet for Patients:   StrictlyIdeas.no   Fact Sheet for Healthcare Providers:   BankingDealers.co.za    This test is not yet approved or cleared by the [place] FDA and  has been authorized for detection and/or diagnosis of SARS-CoV-2 by FDA under an Emergency Use Authorization (EUA).  This EUA will remain in effect (meaning  this test can be used) for the duration of  the COVID-19 declaration under Section 564(b)(1) of the Act, 21 U.S.C. section 360-bbb-3(b)(1), unless the authorization is terminated or revoked sooner.  Performed at [facility], [address], [place], [place] [address]      Radiology Studies: No results found.  Scheduled Meds: . vitamin C  500  mg Oral Daily  . aspirin EC  81 mg Oral Daily  . enalapril  20 mg Oral Daily  . enoxaparin (LOVENOX) injection  0.5 mg/kg Subcutaneous Q24H  . insulin aspart  0-20 Units Subcutaneous TID WC  . insulin detemir  0.15 Units/kg Subcutaneous BID  . levothyroxine  88 mcg Oral Q0600  . linagliptin  5 mg Oral Daily  . methylPREDNISolone (SOLU-MEDROL) injection  40 mg Intravenous Q12H  . oxybutynin  10 mg Oral Daily  . pantoprazole  40 mg Oral Daily  . rosuvastatin  20 mg Oral Daily  . sodium chloride flush  3 mL Intravenous Q12H  . zinc sulfate  220 mg Oral Daily   Continuous Infusions: . sodium chloride       LOS: 6 days   Time spent: 40 minutes.  Lorella Nimrod, MD [facility]  If 7PM-7AM, please contact night-coverage [email or web address]  08/28/2019, 3:07 PM   This record has been created using Systems analyst. Errors have been sought and corrected,but may not always be located. Such creation errors do not reflect on the standard of care.

## 2019-08-28 NOTE — Care Management Important Message (Addendum)
Important Message  Patient Details  Name: Jennifer Mosley MRN: [id number] Date of Birth: 10-27-1955   Medicare Important Message Given:  Yes  IM was given to CM to distribute as patient was on Isolation.     Loann Quill 08/28/2019, 11:41 AM

## 2019-08-28 NOTE — Progress Notes (Addendum)
Pt BP at 185/79 MAP 109 and HR 46. Notify NP Randol Kern. Will continue to monitor.  Update 0556: NP Randol Kern ordered Hydralazine 10 mg IV injection PRN every 6 hours for SBP > 160. Will continue to monitor.

## 2019-08-29 DIAGNOSIS — Z7189 Other specified counseling: Secondary | ICD-10-CM

## 2019-08-29 DIAGNOSIS — U071 COVID-19: Secondary | ICD-10-CM | POA: Diagnosis not present

## 2019-08-29 DIAGNOSIS — J9600 Acute respiratory failure, unspecified whether with hypoxia or hypercapnia: Secondary | ICD-10-CM | POA: Diagnosis not present

## 2019-08-29 DIAGNOSIS — R0600 Dyspnea, unspecified: Secondary | ICD-10-CM

## 2019-08-29 DIAGNOSIS — Z515 Encounter for palliative care: Secondary | ICD-10-CM

## 2019-08-29 LAB — GLUCOSE, CAPILLARY
Glucose-Capillary: 210 mg/dL — ABNORMAL HIGH (ref 70–99)
Glucose-Capillary: 191 mg/dL — ABNORMAL HIGH (ref 70–99)
Glucose-Capillary: 170 mg/dL — ABNORMAL HIGH (ref 70–99)
Glucose-Capillary: 122 mg/dL — ABNORMAL HIGH (ref 70–99)

## 2019-08-29 LAB — CBC
HCT: 44.2 % (ref 36.0–46.0)
Hemoglobin: 15.3 g/dL — ABNORMAL HIGH (ref 12.0–15.0)
MCH: 31.6 pg (ref 26.0–34.0)
MCHC: 34.6 g/dL (ref 30.0–36.0)
MCV: 91.3 fL (ref 80.0–100.0)
Platelets: 83 10*3/uL — ABNORMAL LOW (ref 150–400)
RBC: 4.84 MIL/uL (ref 3.87–5.11)
RDW: 15.7 % — ABNORMAL HIGH (ref 11.5–15.5)
WBC: 12.5 10*3/uL — ABNORMAL HIGH (ref 4.0–10.5)
nRBC: 0 % (ref 0.0–0.2)

## 2019-08-29 LAB — FIBRIN DERIVATIVES D-DIMER (ARMC ONLY): Fibrin derivatives D-dimer (ARMC): 6545.35 ng/mL (FEU) — ABNORMAL HIGH (ref 0.00–499.00)

## 2019-08-29 LAB — COMPREHENSIVE METABOLIC PANEL
ALT: 26 U/L (ref 0–44)
AST: 68 U/L — ABNORMAL HIGH (ref 15–41)
Albumin: 1.6 g/dL — ABNORMAL LOW (ref 3.5–5.0)
Alkaline Phosphatase: 202 U/L — ABNORMAL HIGH (ref 38–126)
Anion gap: 9 (ref 5–15)
BUN: 33 mg/dL — ABNORMAL HIGH (ref 8–23)
CO2: 25 mmol/L (ref 22–32)
Calcium: 8 mg/dL — ABNORMAL LOW (ref 8.9–10.3)
Chloride: 99 mmol/L (ref 98–111)
Creatinine, Ser: 0.83 mg/dL (ref 0.44–1.00)
GFR calc Af Amer: >60 mL/min (ref >60)
GFR calc non Af Amer: >60 mL/min (ref >60)
Glucose, Bld: 233 mg/dL — ABNORMAL HIGH (ref 70–99)
Potassium: 4.9 mmol/L (ref 3.5–5.1)
Sodium: 133 mmol/L — ABNORMAL LOW (ref 135–145)
Total Bilirubin: 1.9 mg/dL — ABNORMAL HIGH (ref 0.3–1.2)
Total Protein: 6.3 g/dL — ABNORMAL LOW (ref 6.5–8.1)

## 2019-08-29 LAB — C-REACTIVE PROTEIN: CRP: 9.4 mg/dL — ABNORMAL HIGH (ref <1.0)

## 2019-08-29 MED ORDER — ENSURE ENLIVE PO LIQD
237.0000 mL | Freq: Two times a day (BID) | ORAL | Status: DC
  Administered 2019-08-30 – 2019-09-02 (×6): 237 mL via ORAL

## 2019-08-29 MED ORDER — AMLODIPINE BESYLATE 5 MG PO TABS
5.0000 mg | ORAL_TABLET | Freq: Every day | ORAL | Status: DC
  Administered 2019-08-29 – 2019-09-02 (×5): 5 mg via ORAL
  Filled 2019-08-29 (×7): qty 1

## 2019-08-29 MED ORDER — FUROSEMIDE 10 MG/ML IJ SOLN
40.0000 mg | Freq: Once | INTRAMUSCULAR | Status: AC
  Administered 2019-08-29: 40 mg via INTRAVENOUS
  Filled 2019-08-29: qty 4

## 2019-08-29 NOTE — Consult Note (Signed)
Consultation Note Date: 08/29/2019   Patient Name: Jennifer Mosley  DOB: September 26, 1955  MRN: [id number]  Age / Sex: 64 y.o., female  PCP: Jennifer Merles, MD Referring Physician: Lorella Nimrod, MD  Reason for Consultation: Establishing goals of care and Psychosocial/spiritual support  HPI/Patient Profile: 64 y.o. female  admitted on 08/26/2019 with past medical history significant forCOPD, hypertension, liver cirrhosis and CHF who presents to the ER by EMS for evaluation of shortness of breath. Patient's neighbor had called EMS because of mental status changes. When EMS arrived patient was said to have a pulse oximetry of 48% on room air. She was placed on CPAP with improvement in her pulse oximetry to 88%. By the time she arrived in the ER her pulse oximetry was 77% on CPAP and patient was switched to high flow nasal cannula.  Patient tested positive for COVID-19 virus on July 31 but had symptoms for 24 hours prior to getting tested.Other family members are sick as well. She is unvaccinated.  On arrival she had elevated inflammatory markers, lactic acidosis and elevated procalcitonin.  Chest x-ray with pulmonary vascular congestion/bilateral infiltrate.  Continue to require higher level of oxygen. Completed the course of remdesivir.  Refused Actemra.  Today is day 7 of this hospitalization.  Patient remains weak and dyspneic on 15 L high flow oxygen.  Patient and family face treatment option decisions, advanced directive decisions and anticipatory care needs.     Clinical Assessment and Goals of Care:  This NP Jennifer Mosley reviewed medical records, received report from team, assessed the patient and then spoke with Jennifer Mosley by telephone (followed up by detailed conversation with her daughter in law/Jennifer Mosley to discuss diagnosis, prognosis, GOC, EOL wishes disposition and options.  Patient is weak and with  dyspnea on minimal exertion.  She tells me that she thinks "I am doing pretty good today" and defers conversation to her daughter-in-law.   I spoke at length with Jennifer Mosley was introduced as specialized medical care for people and their families living with serious illness.  If focuses on providing relief from the symptoms and stress of a serious illness.  The goal is to improve quality of life for both the patient and the family.  Created space and opportunity for family member to explore the thoughts and feelings regarding patient's current medical situation.  Family understand the seriousness of the current medical situation.  They continue to attempt to elicit the patient's wishes for herself at this time.  She continues to tell them that she wants to "go home" but they are unsure if this means heavenly home or home in [place].  Family plan to continue this conversation with patient  Family verbalized that if her desire is to be in her own home they would do whenever possible to make that happen for her.   The difference between a aggressive medical intervention path  and a palliative comfort care path for this patient at this time was had.     A  discussion was had today regarding advanced directives.  Concepts specific to code status, artifical feeding and hydration, continued IV antibiotics and rehospitalization was had.    DNR/DNI was discussed and a partial code was documented today.  Jennifer Mosley is very clear that the patient would not want intubation, compression or cardioversion but would be open to utilization of BiPAP.  And he tells me that all family members support this decision.  For now plan is to continue to treat the treatable and hope for improvement.   Questions and concerns addressed.    Family encouraged to call with questions or concerns.     PMT will continue to support holistically.   There is no documented healthcare power of attorney or advanced  directive.  Patient tells me that Jennifer Mosley/daughter-in-law is the person who will speak and represent her in case the patient cannot speak for herself. Jennifer Mosley herself tells me that she has accepted this responsibility with the support from the entire family.      SUMMARY OF RECOMMENDATIONS    Code Status/Advance Care Planning:  Limited code-documented today after conversation with patient and Jennifer Mosley  Family agrees to intermittent BiPap if indicated   Palliative Prophylaxis:   Aspiration, Bowel Regimen, Delirium Protocol, Frequent Pain Assessment and Oral Care  Additional Recommendations (Limitations, Scope, Preferences):  Full Scope Treatment  Psycho-social/Spiritual:   Desire for further Chaplaincy support:yes  Additional Recommendations: Education on Hospice  Prognosis:   Unable to determine  Discharge Planning: To Be Determined      Primary Diagnoses: Present on Admission: . Acute respiratory failure due to COVID-19 ([place]) . Pneumonia due to COVID-19 virus . Essential hypertension . Morbid obesity ([place])   I have reviewed the medical record, interviewed the patient and family, and examined the patient. The following aspects are pertinent.  Past Medical History:  Diagnosis Date  . Anxiety   . COPD (chronic obstructive pulmonary disease) ([place])   . Depression   . Essential hypertension   . Family history of breast cancer   . Family history of cervical cancer   . Family history of colon cancer   . Hyperlipidemia   . Hypertension   . Personal history of colonic polyps   . Stroke ([place])   . 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 Marital status: Widowed    Spouse name: Not on file  . Number of children: Not on file  . Years of education: Not on file  . Highest education level: Not on file  Occupational History  . Not on file  Tobacco Use  . Smoking status: Former Smoker     Packs/day: 0.50    Years: 37.00    Pack years: 18.50    Types: Cigarettes    Quit date: 03/16/2015    Years since quitting: 4.4  . Smokeless tobacco: Never Used  Vaping Use  . Vaping Use: Never used  Substance and Sexual Activity  . Alcohol use: Yes    Alcohol/week: 1.0 standard drink    Types: 1 Cans of beer per week  . Drug use: No  . Sexual activity: Not on file  Other Topics Concern  . Not on file  Social History Narrative  . Not on file   Social Determinants of Health   Financial Resource Strain:   . Difficulty of Paying Living Expenses:   Food Insecurity:   . Worried About Charity fundraiser in the Last  Year:   . Ran Out of Food in the Last Year:   Transportation Needs:   . Film/video editor (Medical):   Marland Kitchen Lack of Transportation (Non-Medical):   Physical Activity:   . Days of Exercise per Week:   . Minutes of Exercise per Session:   Stress:   . Feeling of Stress :   Social Connections:   . Frequency of Communication with Friends and Family:   . Frequency of Social Gatherings with Friends and Family:   . Attends Religious Services:   . Active Member of Clubs or Organizations:   . Attends Archivist Meetings:   Marland Kitchen Marital Status:    Family History  Problem Relation Age of Onset  . Hypertension Mother   . Hyperlipidemia Mother   . Cervical cancer Mother   . Coronary artery disease Father 77       3-vessel CABG  . Colon cancer Father        dx 55s  . Lung cancer Father        dx 68s  . Breast cancer Cousin   . Heart attack Sister 12  . Heart attack Brother 69  . Cancer Maternal Aunt        unk types  . Cancer Maternal Uncle        unk type  . Cancer Paternal Aunt        unk type  . Skin cancer Sister   . Colon polyps Sister   . Colon cancer Brother        dx 44s   Scheduled Meds: . amLODipine  5 mg Oral Daily  . vitamin C  500 mg Oral Daily  . aspirin EC  81 mg Oral Daily  . enalapril  20 mg Oral Daily  . enoxaparin (LOVENOX)  injection  0.5 mg/kg Subcutaneous Q24H  . insulin aspart  0-20 Units Subcutaneous TID WC  . insulin detemir  0.15 Units/kg Subcutaneous BID  . levothyroxine  88 mcg Oral Q0600  . linagliptin  5 mg Oral Daily  . methylPREDNISolone (SOLU-MEDROL) injection  40 mg Intravenous Q12H  . oxybutynin  10 mg Oral Daily  . pantoprazole  40 mg Oral Daily  . rosuvastatin  20 mg Oral Daily  . sodium chloride flush  3 mL Intravenous Q12H  . zinc sulfate  220 mg Oral Daily   Continuous Infusions: . sodium chloride    . lactated ringers 50 mL/hr at 08/28/19 1750   PRN Meds:.sodium chloride, acetaminophen, albuterol, fluticasone, guaiFENesin-dextromethorphan, hydrALAZINE, nitroGLYCERIN, ondansetron **OR** ondansetron (ZOFRAN) IV, phenol, sodium chloride flush Medications Prior to Admission:  Prior to Admission medications   Medication Sig Start Date End Date Taking? Authorizing Provider  albuterol (PROAIR HFA) 108 (90 Base) MCG/ACT inhaler Inhale 2 puffs into the lungs every 4 (four) hours as needed for wheezing or shortness of breath.    Yes [provider]  aspirin 81 MG tablet Take 81 mg by mouth daily.   Yes [provider]  Calcium Carbonate-Vitamin D3 (CALCIUM 600-D) 600-400 MG-UNIT TABS Take 1 tablet by mouth in the morning and at bedtime.   Yes [provider]  enalapril (VASOTEC) 20 MG tablet Take 20 mg by mouth daily.   Yes [provider]  FLOVENT HFA 220 MCG/ACT inhaler Inhale 2 puffs into the lungs daily as needed (shortness of breath).  04/06/19  Yes [provider]  furosemide (LASIX) 20 MG tablet Take 1 tablet (20 mg total) by mouth as needed. For  Lower extremity edema. 07/03/19  Yes Loel Dubonnet, NP  ibuprofen (ADVIL) 800 MG tablet Take 800 mg by mouth every 8 (eight) hours as needed for mild pain or moderate pain.   Yes [provider]  levothyroxine (SYNTHROID) 88 MCG tablet Take 88 mcg by mouth daily. 06/08/19  Yes [provider]  magnesium oxide (MAG-OX) 400 MG tablet Take 400 mg by mouth 2 (two) times daily.   Yes [provider]  oxybutynin (DITROPAN-XL) 10 MG 24 hr tablet Take 10 mg by mouth daily. 02/07/19  Yes [provider]  TRADJENTA 5 MG TABS tablet Take 5 mg by mouth daily. 02/07/19  Yes [provider]  nadolol (CORGARD) 20 MG tablet Take 1 tablet (20 mg total) by mouth daily. 07/03/19   Loel Dubonnet, NP  nitroGLYCERIN (NITROSTAT) 0.4 MG SL tablet Place 1 tablet (0.4 mg total) under the tongue every 5 (five) minutes as needed for chest pain. 04/28/19 07/27/19  End, Harrell Gave, MD  rosuvastatin (CRESTOR) 20 MG tablet Take 1 tablet (20 mg total) by mouth daily. 07/03/19 10/01/19  Loel Dubonnet, NP   No Known Allergies Review of Systems  Respiratory: Positive for shortness of breath.   Neurological: Positive for weakness.    Physical Exam  Vital Signs: BP (!) 179/88 (BP Location: Right Arm)   Pulse (!) 55   Temp 98.4 F (36.9 C) (Oral)   Resp (!) 26   Ht 5\' 4"  (1.626 m)   Wt 102.3 kg   SpO2 (!) 86%   BMI 38.71 kg/m  Pain Scale: 0-10 POSS *See Group Information*: 1-Acceptable,Awake and alert Pain Score: 0-No pain   SpO2: SpO2: (!) 86 % O2 Device:SpO2: (!) 86 % O2 Flow Rate: .O2 Flow Rate (L/min): 50 L/min  IO: Intake/output summary:   Intake/Output Summary (Last 24 hours) at 08/29/2019 7948 Last data filed at 08/29/2019 0434 Gross per 24 hour  Intake 453 ml  Output 750 ml  Net -297 ml    LBM: Last BM Date: 08/19/2019 Baseline Weight: Weight: 108.1 kg Most recent weight: Weight: 102.3 kg     Palliative Assessment/Data: 30%   Discussed with Dr Reesa Chew  Time In: 0930 Time Out: 1045 Time Total: 75 minutes Greater than 50%  of this time was spent counseling and coordinating care related to the above assessment and plan.  Signed by: Jennifer Lessen, NP   Please contact Palliative Medicine Team phone at [phone] for questions and concerns.  For  individual provider: See Shea Evans

## 2019-08-29 NOTE — Consult Note (Signed)
Reason for Consult: Respiratory failure with hypoxia due to COVID-19 Referring Physician: Lorella Nimrod, MD  Jennifer Mosley is an 64 y.o. female.  HPI: History gleaned from available records.  Patient cannot provide full history due to seriously ill medical status. Patient is a 64 year old woman with a history of hepatic cirrhosis and COPD who presented to [facility] on 22 August 2019 due to increasing shortness of breath.  Per the records EMS was alerted due to the patient's mental status changes.  EMS was alerted by a neighbor.  On arrival EMS found the patient to have pulse oximetry of 48% on room air.  She was brought to the emergency room on CPAP with some improvement on oximetry.  The patient had been noted to be positive for COVID-19 on July 31 but had had symptoms for 24 hours prior to getting tested.  Other family members were sick as well.  It is noted that she was on vaccinated.  Has had persistent elevation of inflammatory markers, she had treatment with remdesivir but refused Actemra.  She is currently on the seventh of fourth conversation.  She is currently on high flow nasal cannula O2 as well as 100% nonrebreather.  I have reviewed her CT scan of the chest the patient had a bilateral groundglass airspace opacities compatible with COVID-19 pneumonia.  By my evaluation this is a very severe COVID-19 pneumonia case.  The patient has been noted to be a "partial code" meaning that BiPAP can be provided.  Please note that this is not indicated for cardiac or respiratory arrest.  It is a support measure.  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 ENDOSCOPY;  Service: Gastroenterology;  Laterality: N/A;   COLONOSCOPY WITH PROPOFOL N/A 05/23/2019   Procedure: COLONOSCOPY WITH PROPOFOL;  Surgeon: [name], MD;  Location: [facility] ENDOSCOPY;  Service: Gastroenterology;  Laterality: N/A;   ECTOPIC PREGNANCY SURGERY     ESOPHAGOGASTRODUODENOSCOPY (EGD) WITH PROPOFOL N/A 05/23/2019   Procedure: ESOPHAGOGASTRODUODENOSCOPY (EGD) WITH PROPOFOL;  Surgeon: [name], MD;  Location: [facility] ENDOSCOPY;  Service: Gastroenterology;  Laterality: N/A;   LEFT HEART CATH AND CORONARY ANGIOGRAPHY Left 05/02/2019   Procedure: LEFT HEART CATH AND CORONARY ANGIOGRAPHY;  Surgeon: Nelva Bush, MD;  Location: [place] CV LAB;  Service: Cardiovascular;  Laterality: Left;    Family History  Problem Relation Age of Onset   Hypertension Mother    Hyperlipidemia Mother    Cervical cancer Mother    Coronary artery disease Father 28       3-vessel CABG   Colon cancer Father        dx 67s   Lung  cancer Father        dx 26s   Breast cancer Cousin    Heart attack Sister 48   Heart attack Brother 58   Cancer Maternal Aunt        unk types   Cancer Maternal Uncle        unk type   Cancer Paternal Aunt        unk type   Skin cancer Sister    Colon polyps Sister    Colon cancer Brother        dx 67s    Social History:  reports that she quit smoking about 4 years ago. Her smoking use included cigarettes. She has a 18.50 pack-year smoking history. She has never used smokeless tobacco. She  reports current alcohol use of about 1.0 standard drink of alcohol per week. She reports that she does not use drugs.  Allergies: No Known Allergies  Medications: I have reviewed the patient's current medications.  Results for orders placed or performed during the hospital encounter of 08/15/2019 (from the past 48 hour(s))  Glucose, capillary     Status: Abnormal   Collection Time: 08/27/19  5:35 PM  Result Value Ref Range   Glucose-Capillary 201 (H) 70 - 99 mg/dL    Comment: Glucose reference range applies only to samples taken after fasting for at least 8 hours.  Lactic acid, plasma     Status: Abnormal   Collection Time: 08/27/19  6:38 PM  Result Value Ref Range   Lactic Acid, Venous 3.3 (HH) 0.5 - 1.9 mmol/L    Comment: CRITICAL VALUE NOTED. VALUE IS CONSISTENT WITH PREVIOUSLY REPORTED/CALLED VALUE / JAG Performed at [facility], [address] [place], [place] [address]   Glucose, capillary     Status: Abnormal   Collection Time: 08/27/19  8:51 PM  Result Value Ref Range   Glucose-Capillary 161 (H) 70 - 99 mg/dL    Comment: Glucose reference range applies only to samples taken after fasting for at least 8 hours.  Procalcitonin     Status: None   Collection Time: 08/28/19  6:51 AM  Result Value Ref Range   Procalcitonin 0.19 ng/mL    Comment:        Interpretation: PCT (Procalcitonin) <= 0.5 ng/mL: Systemic infection (sepsis) is not likely. Local bacterial infection is possible. (NOTE)       Sepsis PCT Algorithm           Lower Respiratory Tract                                      Infection PCT Algorithm    ----------------------------     ----------------------------         PCT < 0.25 ng/mL                PCT < 0.10 ng/mL          Strongly encourage             Strongly discourage   discontinuation of antibiotics    initiation of antibiotics    ----------------------------     -----------------------------       PCT 0.25 - 0.50 ng/mL            PCT 0.10 -  0.25 ng/mL               OR       >80%  decrease in PCT            Discourage initiation of                                            antibiotics      Encourage discontinuation           of antibiotics    ----------------------------     -----------------------------         PCT >= 0.50 ng/mL              PCT 0.26 - 0.50 ng/mL               AND        <80% decrease in PCT             Encourage initiation of                                             antibiotics       Encourage continuation           of antibiotics    ----------------------------     -----------------------------        PCT >= 0.50 ng/mL                  PCT > 0.50 ng/mL               AND         increase in PCT                  Strongly encourage                                      initiation of antibiotics    Strongly encourage escalation           of antibiotics                                     -----------------------------                                           PCT <= 0.25 ng/mL                                                 OR                                        > 80% decrease in PCT                                      Discontinue / Do not initiate  antibiotics  Performed at [facility], [address] [place], [place] [address]   CBC     Status: Abnormal   Collection Time: 08/28/19  6:51 AM  Result Value Ref Range   WBC 13.2 (H) 4.0 - 10.5 K/uL   RBC 5.14 (H) 3.87 - 5.11 MIL/uL   Hemoglobin 15.9 (H) 12.0 - 15.0 g/dL   HCT 47.5 (H) 36 - 46 %   MCV 92.4 80.0 - 100.0 fL   MCH 30.9 26.0 - 34.0 pg   MCHC 33.5 30.0 - 36.0 g/dL   RDW 15.2 11.5 - 15.5 %   Platelets 94 (L) 150 - 400 K/uL    Comment: Immature Platelet Fraction may be clinically indicated, consider ordering this additional test [id number]    nRBC 0.0 0.0 - 0.2 %    Comment: Performed at [facility], [address] [place], [place] [address]  Comprehensive  metabolic panel     Status: Abnormal   Collection Time: 08/28/19  6:51 AM  Result Value Ref Range   Sodium 136 135 - 145 mmol/L   Potassium 4.9 3.5 - 5.1 mmol/L   Chloride 101 98 - 111 mmol/L   CO2 24 22 - 32 mmol/L   Glucose, Bld 104 (H) 70 - 99 mg/dL    Comment: Glucose reference range applies only to samples taken after fasting for at least 8 hours.   BUN 27 (H) 8 - 23 mg/dL   Creatinine, Ser 0.73 0.44 - 1.00 mg/dL   Calcium 8.0 (L) 8.9 - 10.3 mg/dL   Total Protein 6.4 (L) 6.5 - 8.1 g/dL   Albumin 1.7 (L) 3.5 - 5.0 g/dL   AST 78 (H) 15 - 41 U/L   ALT 25 0 - 44 U/L   Alkaline Phosphatase 218 (H) 38 - 126 U/L   Total Bilirubin 1.7 (H) 0.3 - 1.2 mg/dL   GFR calc non Af Amer >60 >60 mL/min   GFR calc Af Amer >60 >60 mL/min   Anion gap 11 5 - 15    Comment: Performed at [facility], [address] [place], [place] [address]  Fibrin derivatives D-Dimer [facility] only)     Status: Abnormal   Collection Time: 08/28/19  6:51 AM  Result Value Ref Range   Fibrin derivatives D-dimer ([facility]) 7,230.15 (H) 0.00 - 499.00 ng/mL (FEU)    Comment: (NOTE) <> Exclusion of Venous Thromboembolism (VTE) - OUTPATIENT ONLY   (Emergency Department or [place])    0-499 ng/ml (FEU): With a low to intermediate pretest probability                      for VTE this test result excludes the diagnosis                      of VTE.   >499 ng/ml (FEU) : VTE not excluded; additional work up for VTE is                      required.  <> Testing on Inpatients and Evaluation of Disseminated Intravascular   Coagulation (DIC) Reference Range:   0-499 ng/ml (FEU) Performed at [facility], [address] [place], [place] [address]   C-reactive protein     Status: Abnormal   Collection Time: 08/28/19  6:51 AM  Result Value Ref Range   CRP 9.8 (H) <1.0 mg/dL    Comment: Performed at [facility], [address] [place], [place] [address]  Glucose, capillary     Status: Abnormal    Collection Time: 08/28/19  8:05 AM  Result Value Ref Range   Glucose-Capillary 101 (H) 70 - 99 mg/dL    Comment: Glucose reference range applies only to samples taken after fasting for at least 8 hours.  Glucose, capillary     Status: Abnormal   Collection Time: 08/28/19 12:05 PM  Result Value Ref Range   Glucose-Capillary 105 (H) 70 - 99 mg/dL    Comment: Glucose reference range applies only to samples taken after fasting for at least 8 hours.  Glucose, capillary     Status: Abnormal   Collection Time: 08/28/19  4:47 PM  Result Value Ref Range   Glucose-Capillary 177 (H) 70 - 99 mg/dL    Comment: Glucose reference range applies only to samples taken after fasting for at least 8 hours.  Glucose, capillary     Status: Abnormal   Collection Time: 08/28/19  9:50 PM  Result Value Ref Range   Glucose-Capillary 151 (H) 70 - 99 mg/dL    Comment: Glucose reference range applies only to samples taken after fasting for at least 8 hours.  CBC     Status: Abnormal   Collection Time: 08/29/19  7:33 AM  Result Value Ref Range   WBC 12.5 (H) 4.0 - 10.5 K/uL   RBC 4.84 3.87 - 5.11 MIL/uL   Hemoglobin 15.3 (H) 12.0 - 15.0 g/dL   HCT 44.2 36 - 46 %   MCV 91.3 80.0 - 100.0 fL   MCH 31.6 26.0 - 34.0 pg   MCHC 34.6 30.0 - 36.0 g/dL   RDW 15.7 (H) 11.5 - 15.5 %   Platelets 83 (L) 150 - 400 K/uL    Comment: Immature Platelet Fraction may be clinically indicated, consider ordering this additional test [id number]    nRBC 0.0 0.0 - 0.2 %    Comment: Performed at [facility], [address] [place], [place] [address]  Comprehensive metabolic panel     Status: Abnormal   Collection Time: 08/29/19  7:33 AM  Result Value Ref Range   Sodium 133 (L) 135 - 145 mmol/L   Potassium 4.9 3.5 - 5.1 mmol/L   Chloride 99 98 - 111 mmol/L   CO2 25 22 - 32 mmol/L   Glucose, Bld 233 (H) 70 - 99 mg/dL    Comment: Glucose reference range applies only to samples taken after fasting for at least 8 hours.    BUN 33 (H) 8 - 23 mg/dL   Creatinine, Ser 0.83 0.44 - 1.00 mg/dL   Calcium 8.0 (L) 8.9 - 10.3 mg/dL   Total Protein 6.3 (L) 6.5 - 8.1 g/dL   Albumin 1.6 (L) 3.5 - 5.0 g/dL   AST 68 (H) 15 - 41 U/L   ALT 26 0 - 44 U/L   Alkaline Phosphatase 202 (H) 38 - 126 U/L   Total Bilirubin 1.9 (H) 0.3 - 1.2 mg/dL   GFR calc non Af Amer >60 >60 mL/min   GFR calc Af Amer >60 >60 mL/min   Anion gap 9 5 - 15    Comment: Performed at [facility], [address] [place], [place] [address]  Fibrin derivatives D-Dimer [facility] only)     Status: Abnormal   Collection Time: 08/29/19  7:33 AM  Result Value Ref Range   Fibrin derivatives D-dimer ([facility]) 6,545.35 (H) 0.00 - 499.00 ng/mL (FEU)    Comment: (NOTE) <>  Exclusion of Venous Thromboembolism (VTE) - OUTPATIENT ONLY   (Emergency Department or [place])    0-499 ng/ml (FEU): With a low to intermediate pretest probability                      for VTE this test result excludes the diagnosis                      of VTE.   >499 ng/ml (FEU) : VTE not excluded; additional work up for VTE is                      required.  <> Testing on Inpatients and Evaluation of Disseminated Intravascular   Coagulation (DIC) Reference Range:   0-499 ng/ml (FEU) Performed at [facility], [address] [place], [place] [address]   C-reactive protein     Status: Abnormal   Collection Time: 08/29/19  7:33 AM  Result Value Ref Range   CRP 9.4 (H) <1.0 mg/dL    Comment: Performed at [facility] [address], [place], [place] [address]  Glucose, capillary     Status: Abnormal   Collection Time: 08/29/19  9:09 AM  Result Value Ref Range   Glucose-Capillary 210 (H) 70 - 99 mg/dL    Comment: Glucose reference range applies only to samples taken after fasting for at least 8 hours.  Glucose, capillary     Status: Abnormal   Collection Time: 08/29/19 11:21 AM  Result Value Ref Range   Glucose-Capillary 191 (H) 70 - 99 mg/dL    Comment: Glucose  reference range applies only to samples taken after fasting for at least 8 hours.  Glucose, capillary     Status: Abnormal   Collection Time: 08/29/19  4:21 PM  Result Value Ref Range   Glucose-Capillary 170 (H) 70 - 99 mg/dL    Comment: Glucose reference range applies only to samples taken after fasting for at least 8 hours.    No results found.  Review of Systems  Patient unable to speak complete sentences.  Exhausted on high flow O2 plus nonrebreather mask.  Blood pressure (!) 152/81, pulse 60, temperature 98 F (36.7 C), temperature source Oral, resp. rate (!) 26, height 5\' 4"  (1.626 m), weight 102.3 kg, SpO2 91 %. Physical Exam physical examination is limited due to need for PPE/CAPR GENERAL: Stated obese woman laying semiprone in bed with high flow O2 and nonrebreather mask in place.  Sleepy but arousable.  Unable to complete sentences due to dyspnea. HEAD: Normocephalic, atraumatic.  EYES: Pupils equal, round, reactive to light.  No scleral icterus.  MOUTH: Unable to examine well due to mask. NECK: Supple. No thyromegaly. Trachea midline. No JVD.  No adenopathy. PULMONARY: Examination limited due to need for PPE/CAPR, unable to auscultate well.  She is mildly tachypneic. CARDIOVASCULAR:  Regular rate and rhythm.  Cannot discern any murmurs GASTROINTESTINAL: Obese abdomen, soft, nondistended. MUSCULOSKELETAL: No joint deformity, no clubbing, no edema.  NEUROLOGIC: Lethargic, arousable.  Speaks in monosyllables. SKIN: Intact,warm,dry. PSYCH: Flat affect, cannot assess any further.  Representative slice of CT obtained 24 August 2019:     Assessment/Plan:  Acute respiratory failure with hypoxia due to COVID-19 Severe COVID-19 pneumonia Patient has severe COVID-19 pneumonia Prognosis is very poor Continue oxygen supplementation to maintain oxygen saturations at 85% or better Continue self proning as tolerates Consider baricitinib BiPAP can be utilized if desired Note  that patient's CODE STATUS  should be clarified to DNR/DNI DNR/DNI status does not preclude use of BiPAP  Hepatic cirrhosis due to fatty liver Transaminitis This issue adds complexity to her management Transaminitis may be worse due to COVID-19   Unfortunately less to offer in this situation.  Patient's COVID-19 pneumonia is particularly severe.  Again her CODE STATUS should be clarified to DNR/DNI.  Use of BiPAP can be done with DNR/DNI status.  The "partial" CODE STATUS adds to confusion and may lead to interventions that but the patient does not desire and would be futile in this situation.    Renold Don, MD [facility] 08/29/2019, 5:34 PM     *This note was dictated using voice recognition software/Dragon.  Despite best efforts to proofread, errors can occur which can change the meaning.  Any change was purely unintentional.

## 2019-08-29 NOTE — Progress Notes (Signed)
PROGRESS NOTE    Jennifer Mosley  JOA:[id number] DOB: 1955-12-11 DOA: 09/08/2019 PCP: [name], MD   Brief Narrative: Taken from H&P Jennifer Mosley is a 64 y.o. female with medical history significant for COPD, hypertension, liver cirrhosis and CHF who presents to the ER by EMS for evaluation of shortness of breath.  Patient's neighbor had called EMS because of mental status changes.  When EMS arrived patient was said to have a pulse oximetry of 48% on room air.  She was placed on CPAP with improvement in her pulse oximetry to 88%.  By the time she arrived in the ER her pulse oximetry was 77% on CPAP and patient was switched to high flow nasal cannula.  Patient tested positive for COVID-19 virus on July 31 but had symptoms for 24 hours prior to getting tested.  Other family members are sick as well.  She is unvaccinated. On arrival she had elevated inflammatory markers, lactic acidosis and elevated procalcitonin.  Chest x-ray with pulmonary vascular congestion/bilateral infiltrate.  Continue to require higher level of oxygen. Completed the course of remdesivir.  Refused Actemra.  Subjective: Patient has no new complaint.  Barely making up to 88 to 90% on maximum setting of heated HFNC.  Quickly desaturate with small body movements around the bed.  Assessment & Plan:   Principal Problem:   Acute respiratory failure due to COVID-19 [facility]) Active Problems:   Essential hypertension   Morbid obesity ([place])   Pneumonia due to COVID-19 virus   Acute CHF (congestive heart failure) ([place])  Acute hypoxic respiratory failure secondary to COVID-19 pneumonia. Elevated inflammatory markers, lactic acidosis which is stable but has not resolved yet.  She was requiring higher level of oxygen and saturating barely at mid to high 80s.  Refused Actemra.  mild improvement in D-dimer and CRP.  Procalcitonin 0.13>.0.19 CTA  without PE but extensive bilateral groundglass opacities compatible with  COVID-19 pneumonia.  Completed a course of Zithromax and ceftriaxone along with remdesivir. -Continue Solu Medrol-day 7 -Continue supportive care. -Continue with supplemental oxygen with heated HFNC to maintain saturation above 90%. -Try proning. -Continue to monitor inflammatory markers. -Give her one-time dose of Lasix today due to mild lower extremity edema. -Discontinue IV fluid. -Patient is very high risk for deterioration and death due to her existing comorbidities. -Pulmonary was consulted yesterday-awaiting their recommendations.  Lactic acidosis.  Sepsis ruled out.  Persistent lactic acidosis.  Most likely secondary to hypoxia, and  liver cirrhosis. Most likely is SIRS response, she did not meet sepsis criteria completely. Blood cultures remain negative. Procalcitonin elevated at 0.74 and she was also received ceftriaxone and azithromycin for a possible superadded bacterial infection. -Completed a course of antibiotics with Zithromax and ceftriaxone.  Transaminitis.  Mild worsening in T bili with stable alkaline phosphatase.  Has an history of cirrhosis.  Obtained right upper quadrant ultrasound to rule out any obstruction.  Consistent with chronic cirrhosis, no acute changes and some gallbladder sludge.  History of HFpEF/pulmonary vascular congestion.  Pulmonary vascular congestion was mentioned on her chest x-ray, similar picture can be obtained with COVID-19 pneumonia.  BNP normal.  Patient does not appear volume up on exam.  She was started on Lasix 40 mg daily to keep in negative balance , Lasix was discontinued after having an increase in creatinine. -Echocardiogram with normal EF, unable to determine diastolic function and IVC with greater than 50% collapsibility which is consistent with euvolemia.  Diabetes mellitus.  A1c at 7.6.  CBG  mildly elevated. -Continue home dose of Tradjenta. -Continue with sliding scale.  Bradycardia and hypertension.  Remain  asymptomatic -Discontinue nadolol. -Start her on home enalapril for hypertension.  Hypothyroidism. -Continue home dose of Synthroid.  Morbid obesity. Body mass index is 38.71 kg/m.  This will complicate overall prognosis.  Palliative care consult.  Palliative care was consulted as patient is very high risk for deterioration and death.  Tried discussing with patient and family but she remains full code.  Patient has multiple underlying comorbidities along with recent COVID-19 pneumonia, worsening hypoxia on maximum setting of heated HFNC. -CODE STATUS changed from full code to partial.  Patient would like to go on BiPAP if needed.  Will try to avoid intubation.  Objective: Vitals:   08/29/19 1152 08/29/19 1155 08/29/19 1440 08/29/19 1622  BP:    (!) 152/81  Pulse:    60  Resp:      Temp:    98 F (36.7 C)  TempSrc:    Oral  SpO2: (!) 85% (!) 88% 94% 91%  Weight:      Height:        Intake/Output Summary (Last 24 hours) at 08/29/2019 1627 Last data filed at 08/29/2019 0956 Gross per 24 hour  Intake 690 ml  Output 400 ml  Net 290 ml   Filed Weights   08/26/19 0454 08/28/19 0535 08/29/19 0433  Weight: 103.9 kg 105.2 kg 102.3 kg    Examination:  General.  Chronically ill-appearing lady, appears older than stated age, in no acute distress. Pulmonary.  Few scattered rhonchi bilaterally, normal respiratory effort. CV.  Regular rate and rhythm, no JVD, rub or murmur. Abdomen.  Soft, nontender, nondistended, BS positive. CNS.  Alert and oriented x3.  No focal neurologic deficit. Extremities.  Trace LE edema, no cyanosis, pulses intact and symmetrical. Psychiatry.  Judgment and insight appears normal.  DVT prophylaxis: Lovenox Code Status: Full Family Communication: Discussed with daughter-in-law on phone. Disposition Plan:  Status is: Inpatient  Remains inpatient appropriate because:Inpatient level of care appropriate due to severity of illness   Dispo: The patient is  from: Home              Anticipated d/c is to: Home              Anticipated d/c date is: > 3 days              Patient currently is not medically stable to d/c.  Patient is very high risk for deterioration and death due to her multiple comorbidities with superadded COVID-19 infection.  Continue to require higher level of oxygen.  Completed the remdesivir.  Received enough antibiotics.  Discussed with pulmonary, palliative care will continue meeting with patient and family.  CODE STATUS changed to partial at this time patient would like to go on BiPAP if needed.  Consultants:   Palliative care,  Procedures:  Antimicrobials:   Data Reviewed: I have personally reviewed following labs and imaging studies  CBC: Recent Labs  Lab 08/23/19 0724 08/23/19 0724 08/24/19 0455 08/24/19 0455 08/25/19 0534 08/26/19 0543 08/27/19 0629 08/28/19 0651 08/29/19 0733  WBC 10.6*   < > 12.9*   < > 10.3 10.3 10.1 13.2* 12.5*  NEUTROABS 8.8*  --  10.4*  --  8.5* 9.0* 9.3*  --   --   HGB 15.9*   < > 15.8*   < > 15.4* 16.1* 15.5* 15.9* 15.3*  HCT 45.8   < > 45.8   < > 46.2*  46.5* 45.8 47.5* 44.2  MCV 89.8   < > 91.1   < > 92.4 89.3 92.7 92.4 91.3  PLT 146*   < > 158   < > 111* 113* 81* 94* 83*   < > = values in this interval not displayed.   Basic Metabolic Panel: Recent Labs  Lab 08/23/19 0724 08/23/19 0724 08/24/19 0455 08/24/19 0455 08/25/19 0534 08/26/19 0543 08/27/19 0629 08/28/19 0651 08/29/19 0733  NA 134*   < > 134*   < > 135 136 138 136 133*  K 5.1   < > 4.5   < > 4.8 4.5 4.5 4.9 4.9  CL 98   < > 98   < > 98 100 102 101 99  CO2 28   < > 26   < > 29 27 27 24 25   GLUCOSE 128*   < > 112*   < > 125* 107* 107* 104* 233*  BUN 21   < > 32*   < > 38* 32* 26* 27* 33*  CREATININE 0.86   < > 0.83   < > 1.02* 0.86 0.76 0.73 0.83  CALCIUM 7.7*   < > 7.7*   < > 7.7* 7.9* 7.9* 8.0* 8.0*  MG 2.2  --  2.1  --  2.4 2.4 2.2  --   --   PHOS 2.8  --  2.5  --  3.3 2.7 3.3  --   --    < > = values in  this interval not displayed.   GFR: Estimated Creatinine Clearance: 81.8 mL/min (by C-G formula based on SCr of 0.83 mg/dL). Liver Function Tests: Recent Labs  Lab 08/25/19 0534 08/26/19 0543 08/27/19 0629 08/28/19 0651 08/29/19 0733  AST 55* 69* 83* 78* 68*  ALT 16 22 25 25 26   ALKPHOS 141* 243* 223* 218* 202*  BILITOT 1.5* 1.6* 1.5* 1.7* 1.9*  PROT 6.1* 6.5 6.1* 6.4* 6.3*  ALBUMIN 1.8* 1.8* 1.7* 1.7* 1.6*   No results for input(s): LIPASE, AMYLASE in the last 168 hours. No results for input(s): AMMONIA in the last 168 hours. Coagulation Profile: No results for input(s): INR, PROTIME in the last 168 hours. Cardiac Enzymes: No results for input(s): CKTOTAL, CKMB, CKMBINDEX, TROPONINI in the last 168 hours. BNP (last 3 results) No results for input(s): PROBNP in the last 8760 hours. HbA1C: No results for input(s): HGBA1C in the last 72 hours. CBG: Recent Labs  Lab 08/28/19 1205 08/28/19 1647 08/28/19 2150 08/29/19 0909 08/29/19 1121  GLUCAP 105* 177* 151* 210* 191*   Lipid Profile: No results for input(s): CHOL, HDL, LDLCALC, TRIG, CHOLHDL, LDLDIRECT in the last 72 hours. Thyroid Function Tests: No results for input(s): TSH, T4TOTAL, FREET4, T3FREE, THYROIDAB in the last 72 hours. Anemia Panel: Recent Labs    08/27/19 0629  FERRITIN 168   Sepsis Labs: Recent Labs  Lab 08/26/19 1439 08/26/19 1712 08/27/19 0629 08/27/19 1517 08/27/19 1838 08/28/19 0651  PROCALCITON 0.12  --  0.13  --   --  0.19  LATICACIDVEN 4.0* 3.7*  --  4.4* 3.3*  --     Recent Results (from the past 240 hour(s))  Blood Culture (routine x 2)     Status: None   Collection Time: 08/21/2019 11:40 AM   Specimen: BLOOD  Result Value Ref Range Status   Specimen Description BLOOD LEFT ANTECUBITAL  Final   Special Requests   Final    BOTTLES DRAWN AEROBIC AND ANAEROBIC Blood Culture results may not be optimal  due to an inadequate volume of blood received in culture bottles   Culture   Final     NO GROWTH 5 DAYS Performed at [facility], [address] [place], [place] [address]    Report Status 08/27/2019 FINAL  Final  Blood Culture (routine x 2)     Status: None   Collection Time: 09/08/2019 11:40 AM   Specimen: BLOOD  Result Value Ref Range Status   Specimen Description BLOOD BLOOD LEFT HAND  Final   Special Requests   Final    BOTTLES DRAWN AEROBIC AND ANAEROBIC Blood Culture adequate volume   Culture   Final    NO GROWTH 5 DAYS Performed at [facility], [address] [place], [place] [address]    Report Status 08/27/2019 FINAL  Final  SARS Coronavirus 2 by RT PCR (hospital order, performed in [facility] hospital lab) Nasopharyngeal Nasopharyngeal Swab     Status: Abnormal   Collection Time: 08/21/2019 11:40 AM   Specimen: Nasopharyngeal Swab  Result Value Ref Range Status   SARS Coronavirus 2 POSITIVE (A) NEGATIVE Final    Comment: RESULT CALLED TO, READ BACK BY AND VERIFIED WITH:  Verlene Mayer, RN @ 9163 ON 08/18/2019. PG. (NOTE) SARS-CoV-2 target nucleic acids are DETECTED  SARS-CoV-2 RNA is generally detectable in upper respiratory specimens  during the acute phase of infection.  Positive results are indicative  of the presence of the identified virus, but do not rule out bacterial infection or co-infection with other pathogens not detected by the test.  Clinical correlation with patient history and  other diagnostic information is necessary to determine patient infection status.  The expected result is negative.  Fact Sheet for Patients:   StrictlyIdeas.no   Fact Sheet for Healthcare Providers:   BankingDealers.co.za    This test is not yet approved or cleared by the [place] FDA and  has been authorized for detection and/or diagnosis of SARS-CoV-2 by FDA under an Emergency Use Authorization (EUA).  This EUA will remain in effect (meaning  this test can be used) for the duration of   the COVID-19 declaration under Section 564(b)(1) of the Act, 21 U.S.C. section 360-bbb-3(b)(1), unless the authorization is terminated or revoked sooner.  Performed at [facility], [address] [place], [place] [address]      Radiology Studies: No results found.  Scheduled Meds: . amLODipine  5 mg Oral Daily  . vitamin C  500 mg Oral Daily  . aspirin EC  81 mg Oral Daily  . enalapril  20 mg Oral Daily  . enoxaparin (LOVENOX) injection  0.5 mg/kg Subcutaneous Q24H  . insulin aspart  0-20 Units Subcutaneous TID WC  . insulin detemir  0.15 Units/kg Subcutaneous BID  . levothyroxine  88 mcg Oral Q0600  . linagliptin  5 mg Oral Daily  . methylPREDNISolone (SOLU-MEDROL) injection  40 mg Intravenous Q12H  . oxybutynin  10 mg Oral Daily  . pantoprazole  40 mg Oral Daily  . rosuvastatin  20 mg Oral Daily  . sodium chloride flush  3 mL Intravenous Q12H  . zinc sulfate  220 mg Oral Daily   Continuous Infusions: . sodium chloride    . lactated ringers 50 mL/hr at 08/28/19 1750     LOS: 7 days   Time spent: 40 minutes.  Lorella Nimrod, MD [facility]  If 7PM-7AM, please contact night-coverage [email or web address]  08/29/2019, 4:27 PM   This record has been created using Systems analyst.  Errors have been sought and corrected,but may not always be located. Such creation errors do not reflect on the standard of care.

## 2019-08-29 NOTE — Progress Notes (Signed)
Patient's O2 sats dropped to 79% (sustaining around 83-84%).   Paged MD and respiratory therapist.     RT placed non-rebreather over top of HFNC.   Saturation increased to above 85%.   Patient denies worsening SOB, CP and Dizziness during assessment.    MD aware

## 2019-08-29 NOTE — Plan of Care (Signed)
Patient seen and examined, full consult note to follow.  Patient is currently on 100% high flow O2 and 100% nonrebreather mask.  She is self proning and appears comfortable.  If intermittent BiPAP is desired this can be ordered through respiratory therapy.  BiPAP would have to be utilized in her current room as there are no ICU/stepdown beds available due to the COVID-19 surge.  Continue supportive care as you are doing.  Very little else to add to her otherwise excellent care.  Renold Don, MD [facility]   *This note was dictated using voice recognition software/Dragon.  Despite best efforts to proofread, errors can occur which can change the meaning.  Any change was purely unintentional.

## 2019-08-29 NOTE — Progress Notes (Signed)
Called and spoke to family ([name]).  Gave update

## 2019-08-30 DIAGNOSIS — Z515 Encounter for palliative care: Secondary | ICD-10-CM

## 2019-08-30 DIAGNOSIS — E119 Type 2 diabetes mellitus without complications: Secondary | ICD-10-CM

## 2019-08-30 DIAGNOSIS — R0600 Dyspnea, unspecified: Secondary | ICD-10-CM

## 2019-08-30 DIAGNOSIS — R0602 Shortness of breath: Secondary | ICD-10-CM

## 2019-08-30 DIAGNOSIS — D696 Thrombocytopenia, unspecified: Secondary | ICD-10-CM

## 2019-08-30 DIAGNOSIS — E039 Hypothyroidism, unspecified: Secondary | ICD-10-CM

## 2019-08-30 DIAGNOSIS — I5032 Chronic diastolic (congestive) heart failure: Secondary | ICD-10-CM

## 2019-08-30 LAB — GLUCOSE, CAPILLARY
Glucose-Capillary: 185 mg/dL — ABNORMAL HIGH (ref 70–99)
Glucose-Capillary: 309 mg/dL — ABNORMAL HIGH (ref 70–99)
Glucose-Capillary: 306 mg/dL — ABNORMAL HIGH (ref 70–99)
Glucose-Capillary: 174 mg/dL — ABNORMAL HIGH (ref 70–99)

## 2019-08-30 LAB — COMPREHENSIVE METABOLIC PANEL
ALT: 33 U/L (ref 0–44)
AST: 83 U/L — ABNORMAL HIGH (ref 15–41)
Albumin: 1.7 g/dL — ABNORMAL LOW (ref 3.5–5.0)
Alkaline Phosphatase: 220 U/L — ABNORMAL HIGH (ref 38–126)
Anion gap: 10 (ref 5–15)
BUN: 36 mg/dL — ABNORMAL HIGH (ref 8–23)
CO2: 29 mmol/L (ref 22–32)
Calcium: 8.6 mg/dL — ABNORMAL LOW (ref 8.9–10.3)
Chloride: 97 mmol/L — ABNORMAL LOW (ref 98–111)
Creatinine, Ser: 1.03 mg/dL — ABNORMAL HIGH (ref 0.44–1.00)
GFR calc Af Amer: >60 mL/min (ref >60)
GFR calc non Af Amer: 58 mL/min — ABNORMAL LOW (ref >60)
Glucose, Bld: 216 mg/dL — ABNORMAL HIGH (ref 70–99)
Potassium: 5.1 mmol/L (ref 3.5–5.1)
Sodium: 136 mmol/L (ref 135–145)
Total Bilirubin: 2 mg/dL — ABNORMAL HIGH (ref 0.3–1.2)
Total Protein: 6.9 g/dL (ref 6.5–8.1)

## 2019-08-30 LAB — CBC
HCT: 46.1 % — ABNORMAL HIGH (ref 36.0–46.0)
Hemoglobin: 15.2 g/dL — ABNORMAL HIGH (ref 12.0–15.0)
MCH: 31.4 pg (ref 26.0–34.0)
MCHC: 33 g/dL (ref 30.0–36.0)
MCV: 95.2 fL (ref 80.0–100.0)
Platelets: 70 10*3/uL — ABNORMAL LOW (ref 150–400)
RBC: 4.84 MIL/uL (ref 3.87–5.11)
RDW: 15.1 % (ref 11.5–15.5)
WBC: 14.2 10*3/uL — ABNORMAL HIGH (ref 4.0–10.5)
nRBC: 0 % (ref 0.0–0.2)

## 2019-08-30 LAB — FIBRIN DERIVATIVES D-DIMER (ARMC ONLY): Fibrin derivatives D-dimer (ARMC): 5672.64 ng/mL (FEU) — ABNORMAL HIGH (ref 0.00–499.00)

## 2019-08-30 LAB — C-REACTIVE PROTEIN: CRP: 8.3 mg/dL — ABNORMAL HIGH (ref <1.0)

## 2019-08-30 MED ORDER — HYDROCODONE-ACETAMINOPHEN 5-325 MG PO TABS
1.0000 | ORAL_TABLET | Freq: Four times a day (QID) | ORAL | Status: DC | PRN
  Administered 2019-08-30 – 2019-09-01 (×2): 1 via ORAL
  Filled 2019-08-30 (×2): qty 1

## 2019-08-30 MED ORDER — COLLAGENASE 250 UNIT/GM EX OINT
TOPICAL_OINTMENT | Freq: Every day | CUTANEOUS | Status: DC
  Administered 2019-09-01: 1 via TOPICAL
  Filled 2019-08-30: qty 30

## 2019-08-30 NOTE — Consult Note (Signed)
[facility] Nurse Consult Note: Reason for Consult: Consult requested for abd wound. Wound type: Pt has scar tissue to middle abd and in the center there is a full thickness wound; .8X.8X.2cm, 100% tightly adhered yellow slough, small amt tan drainage.  Erythemia surrounding in the skin fold to 1 cm.  Pt states, "sometimes it heals up but then reopens again later." Dressing procedure/placement/frequency: Topical treatment orders provided for bedside nurses to perform daily as follows to provide enzymatic debridement and promote healing as follows: Apply Santyl to abd wound Q day, then cover with moist 2X2 and foam dressing.  (Change foam dressing Q 3 days or PRN soiling.) Please re-consult if further assistance is needed.  Thank-you,  Julien Girt MSN, [place], [place], [place], [place]

## 2019-08-30 NOTE — Progress Notes (Signed)
PROGRESS NOTE  Jennifer Mosley:[id number] DOB: 1955-11-09 DOA: 08/28/2019 PCP: [name], MD  Brief History   64 year old woman PMH COPD, cirrhosis, CHF presenting with shortness of breath, altered mental status.  Admitted for acute hypoxic respiratory failure secondary to COVID-19 pneumonia.  Treated with steroids, remdesivir.  Refused Actemra.  Seen by palliative care and pulmonology.  Partial code.  BiPAP if needed.  DNR.  No intubation.  Status remains tenuous and prognosis guarded.  A & P  Acute respiratory failure due to COVID-19 [facility]) --Prognosis remains guarded, SPO2 high 80s on heated high flow and nonrebreather.  BiPAP if needed.  Currently not indicated. --CT showed opacities compatible with COVID-19.  Completed a course of azithromycin, ceftriaxone, remdesivir.  Refused Actemra. --Continue Solu-Medrol. --Refusing proning --Appreciate palliative medicine and pulmonology.  Other than BiPAP, no more advanced treatments recommended.  Chronic diastolic CHF (congestive heart failure) (HCC) --Echocardiogram normal LVEF.  Unable to determine diastolic function. --BNP was unremarkable. --Continue monitoring of volume status.  Appears euvolemic.  DM type 2 (diabetes mellitus, type 2) (HCC) Diabetes mellitus type 2.  Hemoglobin A1c 7.6. --Stable.  Continue sliding scale insulin, Tradjenta and Levemir while on steroids.  Hypothyroidism Hypothyroidism --Continue levothyroxine  Thrombocytopenia (HCC) --Somewhat lower today, likely secondary to acute infection.  Monitor CBC daily.  Transaminitis, PMH cirrhosis.  Right upper quadrant ultrasound unremarkable. --Stable.  Lactic acidosis.  Sepsis ruled out.  SIRS response secondary to Covid.  Was treated with empiric antibiotics but no definite bacterial infection discovered.  Hypothyroidism --Continue levothyroxine  Palliative care --Partial CODE STATUS per patient, BiPAP if needed.  Disposition Plan:  Discussion:  Prognosis remains guarded.  Continue treatments directed at hypoxia secondary to Covid.  Status is: Inpatient  Remains inpatient appropriate because:Inpatient level of care appropriate due to severity of illness   Dispo: The patient is from: Home              Anticipated d/c is to: TBD              Anticipated d/c date is: > 3 days              Patient currently is not medically stable to d/c.  DVT prophylaxis: enoxaparin  Code Status: limited Family Communication: none requested  Murray Hodgkins, MD  [facility] Direct contact: see [email or web address] (further directions at bottom of note if needed) 7PM-7AM contact night coverage as at bottom of note 08/30/2019, 11:42 AM  LOS: 8 days   Significant Hospital Events       Consults:      Procedures:     Significant Diagnostic Tests:      Micro Data:      Antimicrobials:     Interval History/Subjective  Desats when off nonrebreather but otherwise seems to be doing fairly well per nursing. Some reported bleeding earlier, may have been from abdominal wound.  Patient denies vaginal bleeding. Tolerating diet.  Breathing okay.  Patient has no specific complaints.  Objective   Vitals:  Vitals:   08/30/19 0530 08/30/19 0854  BP: (!) 145/68 (!) 150/71  Pulse: 62 64  Resp: (!) 24 19  Temp: 98.3 F (36.8 C) 97.8 F (36.6 C)  SpO2: (!) 88% (!) 87%    Exam:  Constitutional:    Appears calm and comfortable ENMT:   grossly normal hearing  Respiratory:   CTA bilaterally, no w/r/r.   Respiratory effort mildly increased Cardiovascular:   RRR, no m/r/g  No LE extremity edema  Telemetry sinus rhythm Abdomen:   Small superficial wound noted lower abdomen without signs or symptoms of infection. Skin:   Chronic bilateral lower extremity skin changes consistent with venous stasis. Psychiatric:   Mental status o Mood, affect appropriate  I have personally reviewed the following:   Today's Data    CBG stable  Creatinine slightly higher today at 1.03.  AST slightly higher at 83.  WBC without significant change 14.2.  Platelets slowly trending down, 70.  Fibrin derivatives trending down, 5672  Scheduled Meds:  amLODipine  5 mg Oral Daily   vitamin C  500 mg Oral Daily   aspirin EC  81 mg Oral Daily   collagenase   Topical Daily   enalapril  20 mg Oral Daily   enoxaparin (LOVENOX) injection  0.5 mg/kg Subcutaneous Q24H   feeding supplement (ENSURE ENLIVE)  237 mL Oral BID BM   insulin aspart  0-20 Units Subcutaneous TID WC   insulin detemir  0.15 Units/kg Subcutaneous BID   levothyroxine  88 mcg Oral Q0600   linagliptin  5 mg Oral Daily   methylPREDNISolone (SOLU-MEDROL) injection  40 mg Intravenous Q12H   oxybutynin  10 mg Oral Daily   pantoprazole  40 mg Oral Daily   rosuvastatin  20 mg Oral Daily   sodium chloride flush  3 mL Intravenous Q12H   zinc sulfate  220 mg Oral Daily   Continuous Infusions:  sodium chloride      Principal Problem:   Acute respiratory failure due to COVID-19 [facility]) Active Problems:   Pneumonia due to COVID-19 virus   Chronic diastolic CHF (congestive heart failure) ([place])   DM type 2 (diabetes mellitus, type 2) (HCC)   Hypothyroidism   Thrombocytopenia ([place])   Essential hypertension   Morbid obesity ([place])   DNR (do not resuscitate) discussion   Palliative care by specialist   Dyspnea   LOS: 8 days   How to contact the [facility] Attending or Consulting provider 7A - 7P or covering provider during after hours 7P -7A, for this patient?  1. Check the care team in [facility] and look for a) attending/consulting TRH provider listed and b) the [facility] team listed 2. Log into [email or web address] and use [place]'s universal password to access. If you do not have the password, please contact the hospital operator. 3. Locate the [facility] provider you are looking for under [facility] and page to a number that you can be directly  reached. 4. If you still have difficulty reaching the provider, please page the [facility] (Director on Call) for the Hospitalists listed on amion for assistance.

## 2019-08-30 NOTE — Assessment & Plan Note (Addendum)
Hemoglobin A1c 7.6. --CBG remains stable.  Stopped sliding scale insulin.  Stopped Tradjenta and long-acting insulin given no significant oral intake

## 2019-08-30 NOTE — Hospital Course (Addendum)
64 year old woman PMH COPD, cirrhosis, CHF presented with shortness of breath, altered mental status.  Admitted for acute hypoxic respiratory failure secondary to COVID-19 pneumonia.  Treated with steroids, remdesivir.  Refused Actemra.  Condition failed to improve, remained with high oxygen requirement.  Seen by palliative care and pulmonology.  Unfortunately little more to offer.  She has remained on high flow nasal cannula with nonrebreather.  She was trialed on BiPAP but ultimately was unable to tolerate it.  DNR.  No intubation.  After further discussion, family decided against BiPAP, no further escalation of care..  At this point the patient continues to require high flow oxygen with nonrebreather and does not appear that she will recover from Covid.  Expect in-hospital death.  Unfortunately hospice not accepting Covid patients.  Daily discussions with daughter-in-law for goals of care.  Not yet comfort care

## 2019-08-30 NOTE — Plan of Care (Signed)
No falls this shift, POC reviewed with patient. Educated patient on maintaining O2% above 85. Chest vest therapy done, tolerated well. IS done with nurse present.  Problem: Education: Goal: Knowledge of risk factors and measures for prevention of condition will improve Outcome: Not Progressing   Problem: Coping: Goal: Psychosocial and spiritual needs will be supported Outcome: Progressing  Problem: Respiratory: Goal: Complications related to the disease process, condition or treatment will be avoided or minimized Outcome: Not Progressing   Problem: Education: Goal: Knowledge of General Education information will improve Description: Including pain rating scale, medication(s)/side effects and non-pharmacologic comfort measures Outcome: Not Progressing   Problem: Health Behavior/Discharge Planning: Goal: Ability to manage health-related needs will improve Outcome: Not Progressing   Problem: Clinical Measurements: Goal: Ability to maintain clinical measurements within normal limits will improve Outcome: Not Progressing Goal: Respiratory complications will improve Outcome: Not Progressing

## 2019-08-30 NOTE — Assessment & Plan Note (Addendum)
--  labile, secondary to acute illness.  Goals of care with daughter-in-law, no further testing.

## 2019-08-30 NOTE — Assessment & Plan Note (Addendum)
--  Echocardiogram normal LVEF.  Unable to determine diastolic function. --BNP was unremarkable. --Fluid balance negative.  Minimal lower extremity edema.

## 2019-08-30 NOTE — Assessment & Plan Note (Addendum)
Hypothyroidism --will stop levothyroxine based on goals of care

## 2019-08-30 NOTE — Assessment & Plan Note (Addendum)
--  Still tolerating high flow nasal cannula nonrebreather.  Worsening daily.  Encephalopathic.  Not following commands. --Completed ceftriaxone, remdesivir, azithromycin.  Refused Actemra. --Decrease Solu-Medrol. --Appreciate palliative medicine and pulmonology consultations earlier in hospitalization --No BiPAP or escalation of care. --Prognosis grim

## 2019-08-31 DIAGNOSIS — R0603 Acute respiratory distress: Secondary | ICD-10-CM

## 2019-08-31 DIAGNOSIS — R0902 Hypoxemia: Secondary | ICD-10-CM | POA: Insufficient documentation

## 2019-08-31 LAB — CBC
HCT: 48.9 % — ABNORMAL HIGH (ref 36.0–46.0)
Hemoglobin: 15.8 g/dL — ABNORMAL HIGH (ref 12.0–15.0)
MCH: 31.7 pg (ref 26.0–34.0)
MCHC: 32.3 g/dL (ref 30.0–36.0)
MCV: 98.2 fL (ref 80.0–100.0)
Platelets: 102 10*3/uL — ABNORMAL LOW (ref 150–400)
RBC: 4.98 MIL/uL (ref 3.87–5.11)
RDW: 15.6 % — ABNORMAL HIGH (ref 11.5–15.5)
WBC: 17.9 10*3/uL — ABNORMAL HIGH (ref 4.0–10.5)
nRBC: 0.2 % (ref 0.0–0.2)

## 2019-08-31 LAB — NA AND K (SODIUM & POTASSIUM), RAND UR
Potassium Urine: 56 mmol/L
Sodium, Ur: 13 mmol/L

## 2019-08-31 LAB — FIBRIN DERIVATIVES D-DIMER (ARMC ONLY): Fibrin derivatives D-dimer (ARMC): 4718.55 ng/mL (FEU) — ABNORMAL HIGH (ref 0.00–499.00)

## 2019-08-31 LAB — COMPREHENSIVE METABOLIC PANEL
ALT: 170 U/L — ABNORMAL HIGH (ref 0–44)
AST: 386 U/L — ABNORMAL HIGH (ref 15–41)
Albumin: 1.7 g/dL — ABNORMAL LOW (ref 3.5–5.0)
Alkaline Phosphatase: 262 U/L — ABNORMAL HIGH (ref 38–126)
Anion gap: 11 (ref 5–15)
BUN: 41 mg/dL — ABNORMAL HIGH (ref 8–23)
CO2: 28 mmol/L (ref 22–32)
Calcium: 8.4 mg/dL — ABNORMAL LOW (ref 8.9–10.3)
Chloride: 101 mmol/L (ref 98–111)
Creatinine, Ser: 1.21 mg/dL — ABNORMAL HIGH (ref 0.44–1.00)
GFR calc Af Amer: 56 mL/min — ABNORMAL LOW (ref >60)
GFR calc non Af Amer: 48 mL/min — ABNORMAL LOW (ref >60)
Glucose, Bld: 137 mg/dL — ABNORMAL HIGH (ref 70–99)
Potassium: 5.5 mmol/L — ABNORMAL HIGH (ref 3.5–5.1)
Sodium: 140 mmol/L (ref 135–145)
Total Bilirubin: 2.2 mg/dL — ABNORMAL HIGH (ref 0.3–1.2)
Total Protein: 6.9 g/dL (ref 6.5–8.1)

## 2019-08-31 LAB — C-REACTIVE PROTEIN: CRP: 6.4 mg/dL — ABNORMAL HIGH (ref <1.0)

## 2019-08-31 LAB — GLUCOSE, CAPILLARY
Glucose-Capillary: 121 mg/dL — ABNORMAL HIGH (ref 70–99)
Glucose-Capillary: 120 mg/dL — ABNORMAL HIGH (ref 70–99)
Glucose-Capillary: 118 mg/dL — ABNORMAL HIGH (ref 70–99)

## 2019-08-31 MED ORDER — POTASSIUM CHLORIDE IN NACL 20-0.9 MEQ/L-% IV SOLN: INTRAVENOUS | Status: DC

## 2019-08-31 MED ORDER — SODIUM CHLORIDE 0.9 % IV SOLN: INTRAVENOUS | Status: DC

## 2019-08-31 MED ORDER — BISACODYL 10 MG RE SUPP
10.0000 mg | Freq: Once | RECTAL | Status: AC
  Administered 2019-08-31: 10 mg via RECTAL
  Filled 2019-08-31: qty 1

## 2019-08-31 MED ORDER — POLYETHYLENE GLYCOL 3350 17 G PO PACK
17.0000 g | PACK | Freq: Two times a day (BID) | ORAL | Status: DC
  Administered 2019-09-02: 17 g via ORAL
  Filled 2019-08-31 (×3): qty 1

## 2019-08-31 MED ORDER — SENNA 8.6 MG PO TABS
1.0000 | ORAL_TABLET | Freq: Every day | ORAL | Status: DC
  Administered 2019-09-01 – 2019-09-05 (×4): 8.6 mg via ORAL
  Filled 2019-08-31 (×6): qty 1

## 2019-08-31 MED ORDER — SODIUM CHLORIDE 0.9 % IV BOLUS
500.0000 mL | Freq: Once | INTRAVENOUS | Status: AC
  Administered 2019-08-31: 500 mL via INTRAVENOUS

## 2019-08-31 MED ORDER — SODIUM ZIRCONIUM CYCLOSILICATE 10 G PO PACK: 10.0000 g | PACK | Freq: Once | ORAL | Status: DC

## 2019-08-31 MED ORDER — ADULT MULTIVITAMIN W/MINERALS CH
1.0000 | ORAL_TABLET | Freq: Every day | ORAL | Status: DC
  Administered 2019-09-01 – 2019-09-02 (×2): 1 via ORAL
  Filled 2019-08-31 (×3): qty 1

## 2019-08-31 NOTE — Plan of Care (Signed)
Patient continues to [place]  (60s) when O2 Bipap/mask is removed to give meds or attempt to feed.  At this time,  patient  is unable to safely remove O2 unless absolutely necessary.

## 2019-08-31 NOTE — Progress Notes (Signed)
Pt not tolerating bipap, constantly pulling off face. Placed on heated HFNC at 100%, tolerating better at this time. Agricultural consultant notified.

## 2019-08-31 NOTE — Assessment & Plan Note (Signed)
--  complicating acute illness. Recommendations per dietician.

## 2019-08-31 NOTE — Progress Notes (Signed)
Patient ID: Jennifer Mosley, female   DOB: Jul 15, 1955, 64 y.o.   MRN: [id number]  This NP visited patient at the bedside as a follow up for palliative medicine needs and emotional support.  Discussed with patient's bedside RN/Jennifer Mosley Patient is weak and currently dependent on BiPAP to maintain O2 saturations.  Per nursing patient drastically desats into the 60s when BiPAP is removed simply give meds or for any oral intake.Marland Kitchen  Spoke to main Advice worker Jennifer Mosley/sister-in-law .  Continued education regarding current medical situation.  Family is struggling with "what to do, I wish I knew what Jennifer Mosley would want", "if all this is not going to help then why are we doing it".  Education offered on the difference between an aggressive medical intervention path and a palliative comfort path for this patient at this time in this situation. Natural  trajectory and expectations at end of life was discussed , education offered on the utilization of medications for comfort at end-of-life.  Questions and concerns addressed   For now plan is to continue with current medical situations and reevaluate in the morning.  If decision maker is facing a shift to a full comfort path she is requesting visitation/under Covid guidelines.  Emotional support offered  This NP will call family in the morning.  Discussed with Jennifer Mosley the importance of continued conversation with all family and the medical providers regarding overall plan of care and treatment options,  ensuring decisions are within the context of the patients values and GOCs.    Discussed with Dr Jennifer Mosley   Total time spent on the unit was 35 miutes  Greater than 50% of the time was spent in counseling and coordination of care  Wadie Lessen NP  Palliative Medicine Team Team Phone # [phone] Pager [phone]

## 2019-08-31 NOTE — Progress Notes (Addendum)
PROGRESS NOTE  Jennifer Mosley NWG:[id number] DOB: [birth year] DOA: 08/28/2019 PCP: [name], MD  Brief History   64 year old woman PMH COPD, cirrhosis, CHF presenting with shortness of breath, altered mental status.  Admitted for acute hypoxic respiratory failure secondary to COVID-19 pneumonia.  Treated with steroids, remdesivir.  Refused Actemra.  Seen by palliative care and pulmonology.  Partial code.  BiPAP if needed.  DNR.  No intubation.  Status remains tenuous and prognosis guarded.  A & P  Acute respiratory failure due to COVID-19 [facility]) --Prognosis remains guarded, but appeared stable on BiPAP earlier, now transitioned to high flow nasal cannula. --Completed a course of azithromycin, ceftriaxone and remdesivir.  Refused Actemra. --Continue Solu-Medrol.  Refusing proning. --Appreciate palliative medicine and pulmonology.  Other than BiPAP, no more advanced treatments recommended.  Chronic diastolic CHF (congestive heart failure) (HCC) --Echocardiogram normal LVEF.  Unable to determine diastolic function. --BNP was unremarkable. --Appears euvolemic.  DM type 2 (diabetes mellitus, type 2) (HCC) Diabetes mellitus type 2.  Hemoglobin A1c 7.6. --Remains stable.  Continue sliding scale insulin, Tradjenta and Levemir while on steroids.  Hypothyroidism Hypothyroidism --continue levothyroxine  Thrombocytopenia (HCC) --better today, secondary to acute infection.  Monitor CBC daily.  Pneumonia due to COVID-19 virus Plan as above  Morbid obesity ([place]) --complicating acute illness. Recommendations per dietician.  Transaminitis, PMH cirrhosis.  Right upper quadrant ultrasound unremarkable. --Stable.  Lactic acidosis.  Sepsis ruled out.  SIRS response secondary to Covid.  Was treated with empiric antibiotics but no definite bacterial infection discovered.  Hypothyroidism --Continue levothyroxine  Palliative care --Partial CODE STATUS per patient, BiPAP if  needed.  Disposition Plan:  Discussion: Prognosis remains guarded.  Continue current treatments.  Will discuss with family.  ADDENDUM 1900 Discussed in detail with [name] (daughter-in-law); reviewed current tx, dx and guarded prognosis. Pt is critically ill and may not survive this hospitalization. Tolerating BiPAP but unable to remain off BiPAP for any significant time. No intubation. We discussed pt may die or if no improvement tomorrow will need to discuss considering comfort care. She wishes to visit if possible. Will have team discuss policy with her.  Status is: Inpatient  Remains inpatient appropriate because:Inpatient level of care appropriate due to severity of illness   Dispo: The patient is from: Home              Anticipated d/c is to: TBD              Anticipated d/c date is: > 3 days              Patient currently is not medically stable to d/c.  DVT prophylaxis: enoxaparin  Code Status: limited Family Communication: none requested  Murray Hodgkins, MD  [facility] Direct contact: see [email or web address] (further directions at bottom of note if needed) 7PM-7AM contact night coverage as at bottom of note 08/31/2019, 2:55 PM  LOS: 9 days   Significant Hospital Events   .    Consults:  .    Procedures:  .   Significant Diagnostic Tests:  Marland Kitchen    Micro Data:  .    Antimicrobials:  .   Interval History/Subjective  Tolerating BiPAP, a little confused  Objective   Vitals:  Vitals:   08/31/19 1209 08/31/19 1245  BP: (!) 142/65   Pulse:    Resp:    Temp:    SpO2: 93% 90%    Exam:  Constitutional:   . Appears calm and comfortable on BiPAP Respiratory:  .  CTA bilaterally, no w/r/r.  . Respiratory effort normal.  Cardiovascular:  . RRR, no m/r/g . No LE extremity edema   Abdomen:  . soft Skin:  . No change in minor abd wound Psychiatric:  . Mental status o Mood, affect difficult to judge but awake, appropriate  I have personally reviewed  the following:   Today's Data  . CBG stable . Potassium slightly higher at 5.5, BUN/creatinine slightly up at 41 and 1.21. . AST up to 386 and ALT up to 170, new . Total bilirubin stable at 2.2 . CRP trending down at 6.4 . Platelets up to 102 . Leukocytosis most likely from steroids . Fibrin derivatives trending down 4718  Scheduled Meds: . amLODipine  5 mg Oral Daily  . vitamin C  500 mg Oral Daily  . aspirin EC  81 mg Oral Daily  . collagenase   Topical Daily  . enoxaparin (LOVENOX) injection  0.5 mg/kg Subcutaneous Q24H  . feeding supplement (ENSURE ENLIVE)  237 mL Oral BID BM  . insulin aspart  0-20 Units Subcutaneous TID WC  . insulin detemir  0.15 Units/kg Subcutaneous BID  . levothyroxine  88 mcg Oral Q0600  . linagliptin  5 mg Oral Daily  . methylPREDNISolone (SOLU-MEDROL) injection  40 mg Intravenous Q12H  . [START ON 09/01/2019] multivitamin with minerals  1 tablet Oral Daily  . oxybutynin  10 mg Oral Daily  . pantoprazole  40 mg Oral Daily  . polyethylene glycol  17 g Oral BID  . rosuvastatin  20 mg Oral Daily  . senna  1 tablet Oral QHS  . sodium chloride flush  3 mL Intravenous Q12H  . zinc sulfate  220 mg Oral Daily   Continuous Infusions: . sodium chloride      Principal Problem:   Acute respiratory failure due to COVID-19 [facility]) Active Problems:   Pneumonia due to COVID-19 virus   Chronic diastolic CHF (congestive heart failure) ([place])   DM type 2 (diabetes mellitus, type 2) ([place])   Hypothyroidism   Thrombocytopenia ([place])   Essential hypertension   Morbid obesity ([place])   DNR (do not resuscitate) discussion   Palliative care by specialist   Dyspnea   LOS: 9 days   How to contact the [facility] Attending or Consulting provider [place] or covering provider during after hours [place], for this patient?  1. Check the care team in [facility] and look for a) attending/consulting TRH provider listed and b) the [facility] team listed 2. Log into [email or web address] and use [facility]'s universal password to access. If you do not have the password, please contact the hospital operator. 3. Locate the [facility] provider you are looking for under [facility] and page to a number that you can be directly reached. 4. If you still have difficulty reaching the provider, please page the [facility] (Director on Call) for the Hospitalists listed on amion for assistance.

## 2019-08-31 NOTE — Progress Notes (Signed)
Initial Nutrition Assessment  DOCUMENTATION CODES:   Obesity unspecified  INTERVENTION:   Ensure Enlive po BID, each supplement provides 350 kcal and 20 grams of protein  MVI daily   Liberalize diet  Pt at high refeed risk; recommend monitor K, Mg and P labs daily as oral intake improves.   NUTRITION DIAGNOSIS:   Inadequate oral intake related to acute illness (COVID 19) as evidenced by meal completion < 25%.  GOAL:   Patient will meet greater than or equal to 90% of their needs  MONITOR:   PO intake, Supplement acceptance, Labs, Weight trends, Skin, I & O's  REASON FOR ASSESSMENT:   LOS    ASSESSMENT:   64 year old woman PMH COPD, cirrhosis, CHF presenting with shortness of breath, altered mental status.  Admitted for acute hypoxic respiratory failure secondary to COVID-19 pneumonia.   RD working remotely.  Pt with poor appetite and oral intake since admit; pt now > 7 days with inadequate nutrition. Pt currently on bipap and unable to eat anything today. Palliative care following for GOC; pt requesting full scope at this time. Would recommend nasogastric tube placement and tube feeds to help pt meet her estimated needs. If nasogastric tube unable to be placed, may need to consider TPN. Pt is at high refeed risk. RD will liberalize pt's diet. Pt already ordered for Ensure supplements. Per chart, pt appears fairly weight stable since admit.   Medications reviewed and include: vitamin C, aspirin, lovenox, insulin, synthroid, solu-medrol, protonix, miralax, senokot, zinc   Labs reviewed: K 5.5(H), BUN 41(H), creat 1.21(H), alk phos 262(H), AST 386(H), ALT 170(H) Wbc- 17.9(H)  NUTRITION - FOCUSED PHYSICAL EXAM: Unable to perform at this time   Diet Order:   Diet Order            Diet Heart Room service appropriate? Yes; Fluid consistency: Thin  Diet effective now                EDUCATION NEEDS:   Not appropriate for education at this time  Skin:  Skin  Assessment: Reviewed RN Assessment (abdomen 8X.8X.2cm, MASD, ecchymosis)  Last BM:  unknown  Height:   Ht Readings from Last 1 Encounters:  08/23/19 _0  (1.626 m)    Weight:   Wt Readings from Last 1 Encounters:  08/30/19 104.5 kg    Ideal Body Weight:  54.5 kg  BMI:  Body mass index is 39.55 kg/m.  Estimated Nutritional Needs:   Kcal:  2100-2400kcal/day  Protein:  105-120g/day  Fluid:  1.6-1.9L/day  Koleen Distance MS, RD, LDN Please refer to [facility] for RD and/or RD on-call/weekend/after hours pager

## 2019-08-31 NOTE — Care Management Important Message (Signed)
Important Message  Patient Details  Name: Jennifer Mosley MRN: [id number] Date of Birth: 05/27/1955   Medicare Important Message Given:  Yes IM was given to CM to distribute as patient was on Isolation.    Loann Quill 08/31/2019, 10:52 AM

## 2019-08-31 NOTE — Assessment & Plan Note (Addendum)
Plan as above --No escalation of care

## 2019-09-01 ENCOUNTER — Ambulatory Visit: Payer: Medicare Other | Admitting: Urology

## 2019-09-01 ENCOUNTER — Encounter: Payer: Self-pay | Admitting: Urology

## 2019-09-01 DIAGNOSIS — R7989 Other specified abnormal findings of blood chemistry: Secondary | ICD-10-CM

## 2019-09-01 DIAGNOSIS — E875 Hyperkalemia: Secondary | ICD-10-CM

## 2019-09-01 LAB — CBC WITH DIFFERENTIAL/PLATELET
Abs Immature Granulocytes: 0.09 10*3/uL — ABNORMAL HIGH (ref 0.00–0.07)
Basophils Absolute: 0 10*3/uL (ref 0.0–0.1)
Basophils Relative: 0 %
Eosinophils Absolute: 0.1 10*3/uL (ref 0.0–0.5)
Eosinophils Relative: 1 %
HCT: 46.2 % — ABNORMAL HIGH (ref 36.0–46.0)
Hemoglobin: 14.9 g/dL (ref 12.0–15.0)
Immature Granulocytes: 1 %
Lymphocytes Relative: 2 %
Lymphs Abs: 0.2 10*3/uL — ABNORMAL LOW (ref 0.7–4.0)
MCH: 30.7 pg (ref 26.0–34.0)
MCHC: 32.3 g/dL (ref 30.0–36.0)
MCV: 95.3 fL (ref 80.0–100.0)
Monocytes Absolute: 0.7 10*3/uL (ref 0.1–1.0)
Monocytes Relative: 6 %
Neutro Abs: 11.3 10*3/uL — ABNORMAL HIGH (ref 1.7–7.7)
Neutrophils Relative %: 90 %
Platelets: 88 10*3/uL — ABNORMAL LOW (ref 150–400)
RBC: 4.85 MIL/uL (ref 3.87–5.11)
RDW: 15.6 % — ABNORMAL HIGH (ref 11.5–15.5)
WBC: 12.4 10*3/uL — ABNORMAL HIGH (ref 4.0–10.5)
nRBC: 0 % (ref 0.0–0.2)

## 2019-09-01 LAB — COMPREHENSIVE METABOLIC PANEL
ALT: 1113 U/L — ABNORMAL HIGH (ref 0–44)
AST: 2065 U/L — ABNORMAL HIGH (ref 15–41)
Albumin: 1.6 g/dL — ABNORMAL LOW (ref 3.5–5.0)
Alkaline Phosphatase: 344 U/L — ABNORMAL HIGH (ref 38–126)
Anion gap: 7 (ref 5–15)
BUN: 43 mg/dL — ABNORMAL HIGH (ref 8–23)
CO2: 28 mmol/L (ref 22–32)
Calcium: 7.4 mg/dL — ABNORMAL LOW (ref 8.9–10.3)
Chloride: 107 mmol/L (ref 98–111)
Creatinine, Ser: 1.13 mg/dL — ABNORMAL HIGH (ref 0.44–1.00)
GFR calc Af Amer: >60 mL/min (ref >60)
GFR calc non Af Amer: 52 mL/min — ABNORMAL LOW (ref >60)
Glucose, Bld: 149 mg/dL — ABNORMAL HIGH (ref 70–99)
Potassium: 5.6 mmol/L — ABNORMAL HIGH (ref 3.5–5.1)
Sodium: 142 mmol/L (ref 135–145)
Total Bilirubin: 2.2 mg/dL — ABNORMAL HIGH (ref 0.3–1.2)
Total Protein: 6.3 g/dL — ABNORMAL LOW (ref 6.5–8.1)

## 2019-09-01 LAB — GLUCOSE, CAPILLARY
Glucose-Capillary: 129 mg/dL — ABNORMAL HIGH (ref 70–99)
Glucose-Capillary: 131 mg/dL — ABNORMAL HIGH (ref 70–99)
Glucose-Capillary: 138 mg/dL — ABNORMAL HIGH (ref 70–99)
Glucose-Capillary: 138 mg/dL — ABNORMAL HIGH (ref 70–99)
Glucose-Capillary: 145 mg/dL — ABNORMAL HIGH (ref 70–99)

## 2019-09-01 LAB — C-REACTIVE PROTEIN: CRP: 4.5 mg/dL — ABNORMAL HIGH (ref <1.0)

## 2019-09-01 LAB — FIBRIN DERIVATIVES D-DIMER (ARMC ONLY): Fibrin derivatives D-dimer (ARMC): 4301.8 ng/mL (FEU) — ABNORMAL HIGH (ref 0.00–499.00)

## 2019-09-01 MED ORDER — SODIUM ZIRCONIUM CYCLOSILICATE 10 G PO PACK
10.0000 g | PACK | Freq: Once | ORAL | Status: AC
  Administered 2019-09-01: 10 g via ORAL
  Filled 2019-09-01: qty 1

## 2019-09-01 MED ORDER — SODIUM CHLORIDE 0.9 % IV SOLN: INTRAVENOUS | Status: AC

## 2019-09-01 NOTE — Progress Notes (Signed)
Spoke w/ patients daughter n law on phone regarding visitation policy and pt updates.

## 2019-09-01 NOTE — Progress Notes (Signed)
Pt on heated hi flo and 100% NRB mask with O2 sat 92%.  Pt required pain med earlier for abd pain which she later said does help her.  Pt tearful at times.  Wound care done to lower abdomen.

## 2019-09-01 NOTE — Plan of Care (Addendum)
Currently needing 15L NRB and Heated HF to maintain SPO2 above 85%.  Did attempt to remove NRB and leave heated HF in place to take a few sips/bites for breakfast.  Patient appreciated, but fairly quickly, spo2 fell to 64s and labored.  Re-placed NRB to recover.   On a positive note: Patient does seem to be more alert and responsive today.  Not nearly as lethargic.  Spoke with Deneise Lever (daughter-in-law) to gave update.      1735   SPO2 levels have remained more steady this afternoon (mid 90s for most of the day). Patient was also able to eat about 10% of her tray with assistance.  Unfortunately, still on Heated HF and NRB 15L.

## 2019-09-01 NOTE — Assessment & Plan Note (Addendum)
--  Spurious with hemolysis noted. --Discussed with family, no further investigation or laboratory studies.

## 2019-09-01 NOTE — Progress Notes (Signed)
PROGRESS NOTE  Jennifer Mosley:[id number] DOB: [birth year] DOA: 09/06/2019 PCP: [name], MD  Brief History   64 year old woman PMH COPD, cirrhosis, CHF presenting with shortness of breath, altered mental status.  Admitted for acute hypoxic respiratory failure secondary to COVID-19 pneumonia.  Treated with steroids, remdesivir.  Refused Actemra.  Seen by palliative care and pulmonology.  Partial code.  BiPAP if needed.  DNR.  No intubation.  Status remains tenuous and prognosis guarded.  A & P  Acute respiratory failure due to COVID-19 [facility]) --Prognosis remains guarded, was not able to tolerate BiPAP yesterday, appears somewhat better today on high flow nasal cannula and nonrebreather. --Completed ceftriaxone, remdesivir, azithromycin.  Refused Actemra. --Continue Solu-Medrol.  Refused proning. --Appreciate palliative medicine and pulmonology.  Other than BiPAP, no more advanced treatments recommended.  Chronic diastolic CHF (congestive heart failure) (HCC) --Echocardiogram normal LVEF.  Unable to determine diastolic function. --BNP was unremarkable. --Appears euvolemic. Fluid balance negative.  DM type 2 (diabetes mellitus, type 2) (HCC) Diabetes mellitus type 2.  Hemoglobin A1c 7.6. --Remains stable.  Continue sliding scale insulin. Holding Tradjenta and long-acting insulin given poor oral intake  Hypothyroidism Hypothyroidism --continue levothyroxine  Thrombocytopenia (HCC) --labile, secondary to acute illness. Follow with CBC  Pneumonia due to COVID-19 virus Plan as above  Morbid obesity ([place]) --complicating acute illness. Recommendations per dietician.  Hyperkalemia --not on any offending meds --adequate UOP, creatinine stable today --Lokelma x1 --BMP in AM  Elevated LFTs --AST/ALT up >1000 in 2 days. Discussed w/ pharmacy, etiology unclear; no offending meds currently; could be ADR to remdesivir although finished 8/14 would make this a delayed rxn. Has not  been hypotensive. asymptomatic --will stop statin --RUQ u/s 8/14 showed cirrhosis, gallbladder sludge No obstructive symptoms or abdominal pain on exam  Lactic acidosis.  Sepsis ruled out.  SIRS response secondary to Covid.  Was treated with empiric antibiotics but no definite bacterial infection discovered.  Hypothyroidism --Continue levothyroxine  Palliative care --Partial CODE STATUS per patient, BiPAP if needed.  Disposition Plan:  Discussion: Seems to be tolerating high flow and nonrebreather.  More awake, alert interactive today.  Prognosis remains guarded given his severe hypoxia.  New issues of hyperkalemia and transaminitis, elevated total bilirubin with plans as above..  Status is: Inpatient  Remains inpatient appropriate because:Inpatient level of care appropriate due to severity of illness   Dispo: The patient is from: Home              Anticipated d/c is to: TBD              Anticipated d/c date is: > 3 days              Patient currently is not medically stable to d/c.  DVT prophylaxis: enoxaparin  Code Status: limited Family Communication: called dgt-in-law, line busy  Murray Hodgkins, MD  [facility] Direct contact: see [email or web address] (further directions at bottom of note if needed) 7PM-7AM contact night coverage as at bottom of note 09/01/2019, 3:22 PM  LOS: 10 days   Significant Hospital Events   .    Consults:  .    Procedures:  .   Significant Diagnostic Tests:  . COVID+   Micro Data:  . BC NG/F   Antimicrobials:  . Azithromycin 8/10 > 8/14  Interval History/Subjective  More alert today per RN Pt feels ok, no pain, breathing fine, no complaints  Objective   Vitals:  Vitals:   09/01/19 1148 09/01/19 1202  BP:  Pulse:    Resp:    Temp:    SpO2: (!) 73% 91%    Exam:  Constitutional:   . Appears better today, calm comfortable on HFNC and NRB ENMT:  . grossly normal hearing  Respiratory:  . CTA bilaterally, no w/r/r.   . Respiratory effort normal Cardiovascular:  . RRR, no m/r/g . No LE extremity edema   . Telemetry SB Abdomen:  . Soft ntnd Skin:  . Wound lower abd closed, no exudate, no erythema Psychiatric:  . Mental status o Mood, affect appropriate o Oriented to self, [place] [facility]  I have personally reviewed the following:   Today's Data  . I/O -2.2 . CBG remains stable . Potassium 5.6 . Creatinine better at 1.13 . AP 83 > 386 > 344 . AST 83 > 386 > 2065 . ALT 33 > 170 > 1113 . T bili 2.2, stable . CRP trending down 6.4 > 4.5 . Platelets labile 102 > 88 . Leukocytosis most likely from steroids, better today . Fibrin derivatives trending down 4718 > 4301  Scheduled Meds: . amLODipine  5 mg Oral Daily  . vitamin C  500 mg Oral Daily  . aspirin EC  81 mg Oral Daily  . collagenase   Topical Daily  . enoxaparin (LOVENOX) injection  0.5 mg/kg Subcutaneous Q24H  . feeding supplement (ENSURE ENLIVE)  237 mL Oral BID BM  . insulin aspart  0-20 Units Subcutaneous TID WC  . levothyroxine  88 mcg Oral Q0600  . methylPREDNISolone (SOLU-MEDROL) injection  40 mg Intravenous Q12H  . multivitamin with minerals  1 tablet Oral Daily  . oxybutynin  10 mg Oral Daily  . pantoprazole  40 mg Oral Daily  . polyethylene glycol  17 g Oral BID  . senna  1 tablet Oral QHS  . sodium chloride flush  3 mL Intravenous Q12H  . zinc sulfate  220 mg Oral Daily   Continuous Infusions: . sodium chloride      Principal Problem:   Acute respiratory failure due to COVID-19 [facility]) Active Problems:   Pneumonia due to COVID-19 virus   Chronic diastolic CHF (congestive heart failure) ([place])   DM type 2 (diabetes mellitus, type 2) ([place])   Hypothyroidism   Thrombocytopenia ([place])   Essential hypertension   Morbid obesity ([place])   DNR (do not resuscitate) discussion   Palliative care by specialist   Dyspnea   Hypoxia   LOS: 10 days   How to contact the [facility] Attending or Consulting provider [place] or  covering provider during after hours [place], for this patient?  1. Check the care team in [facility] and look for a) attending/consulting TRH provider listed and b) the [facility] team listed 2. Log into [email or web address] and use [place]'s universal password to access. If you do not have the password, please contact the hospital operator. 3. Locate the [facility] provider you are looking for under [facility] and page to a number that you can be directly reached. 4. If you still have difficulty reaching the provider, please page the [facility] (Director on Call) for the Hospitalists listed on amion for assistance.

## 2019-09-01 NOTE — Assessment & Plan Note (Addendum)
--  AST/ALT up >1000 rapidly. Discussed w/ pharmacy, etiology unclear; no offending meds currently; could be ADR to remdesivir although finished 8/14 would make this a delayed rxn. Has not been hypotensive. asymptomatic --Statin stopped --RUQ u/s 8/14 showed cirrhosis, gallbladder sludge --LFTs trending down.  Total bilirubin was stable..Discussed in detail with family member.  I think this is most likely related to acute illness, congestive hepatopathy or cholestasis from critical illness.  No clear signs or symptoms of acute gallbladder disease.  We discussed further imaging and work-up, family declines any further work-up.  They understand the differential and uncertainty.

## 2019-09-02 DIAGNOSIS — R319 Hematuria, unspecified: Secondary | ICD-10-CM

## 2019-09-02 LAB — CBC WITH DIFFERENTIAL/PLATELET
Abs Immature Granulocytes: 0.14 10*3/uL — ABNORMAL HIGH (ref 0.00–0.07)
Basophils Absolute: 0 10*3/uL (ref 0.0–0.1)
Basophils Relative: 0 %
Eosinophils Absolute: 0.1 10*3/uL (ref 0.0–0.5)
Eosinophils Relative: 1 %
HCT: 49.5 % — ABNORMAL HIGH (ref 36.0–46.0)
Hemoglobin: 16.2 g/dL — ABNORMAL HIGH (ref 12.0–15.0)
Immature Granulocytes: 1 %
Lymphocytes Relative: 2 %
Lymphs Abs: 0.3 10*3/uL — ABNORMAL LOW (ref 0.7–4.0)
MCH: 31.1 pg (ref 26.0–34.0)
MCHC: 32.7 g/dL (ref 30.0–36.0)
MCV: 95 fL (ref 80.0–100.0)
Monocytes Absolute: 0.6 10*3/uL (ref 0.1–1.0)
Monocytes Relative: 5 %
Neutro Abs: 11 10*3/uL — ABNORMAL HIGH (ref 1.7–7.7)
Neutrophils Relative %: 91 %
Platelets: 119 10*3/uL — ABNORMAL LOW (ref 150–400)
RBC: 5.21 MIL/uL — ABNORMAL HIGH (ref 3.87–5.11)
RDW: 16.2 % — ABNORMAL HIGH (ref 11.5–15.5)
WBC: 12.1 10*3/uL — ABNORMAL HIGH (ref 4.0–10.5)
nRBC: 0.4 % — ABNORMAL HIGH (ref 0.0–0.2)

## 2019-09-02 LAB — GLUCOSE, CAPILLARY
Glucose-Capillary: 160 mg/dL — ABNORMAL HIGH (ref 70–99)
Glucose-Capillary: 211 mg/dL — ABNORMAL HIGH (ref 70–99)
Glucose-Capillary: 229 mg/dL — ABNORMAL HIGH (ref 70–99)
Glucose-Capillary: 254 mg/dL — ABNORMAL HIGH (ref 70–99)

## 2019-09-02 LAB — COMPREHENSIVE METABOLIC PANEL
ALT: 804 U/L — ABNORMAL HIGH (ref 0–44)
ALT: 738 U/L — ABNORMAL HIGH (ref 0–44)
AST: 845 U/L — ABNORMAL HIGH (ref 15–41)
AST: 654 U/L — ABNORMAL HIGH (ref 15–41)
Albumin: 1.7 g/dL — ABNORMAL LOW (ref 3.5–5.0)
Albumin: 1.6 g/dL — ABNORMAL LOW (ref 3.5–5.0)
Alkaline Phosphatase: 382 U/L — ABNORMAL HIGH (ref 38–126)
Alkaline Phosphatase: 363 U/L — ABNORMAL HIGH (ref 38–126)
Anion gap: 11 (ref 5–15)
Anion gap: 11 (ref 5–15)
BUN: 40 mg/dL — ABNORMAL HIGH (ref 8–23)
BUN: 45 mg/dL — ABNORMAL HIGH (ref 8–23)
CO2: 22 mmol/L (ref 22–32)
CO2: 23 mmol/L (ref 22–32)
Calcium: 7.8 mg/dL — ABNORMAL LOW (ref 8.9–10.3)
Calcium: 7.6 mg/dL — ABNORMAL LOW (ref 8.9–10.3)
Chloride: 110 mmol/L (ref 98–111)
Chloride: 108 mmol/L (ref 98–111)
Creatinine, Ser: 0.99 mg/dL (ref 0.44–1.00)
Creatinine, Ser: 0.88 mg/dL (ref 0.44–1.00)
GFR calc Af Amer: >60 mL/min (ref >60)
GFR calc Af Amer: >60 mL/min (ref >60)
GFR calc non Af Amer: >60 mL/min (ref >60)
GFR calc non Af Amer: >60 mL/min (ref >60)
Glucose, Bld: 210 mg/dL — ABNORMAL HIGH (ref 70–99)
Glucose, Bld: 295 mg/dL — ABNORMAL HIGH (ref 70–99)
Potassium: 6 mmol/L — ABNORMAL HIGH (ref 3.5–5.1)
Potassium: 5 mmol/L (ref 3.5–5.1)
Sodium: 143 mmol/L (ref 135–145)
Sodium: 142 mmol/L (ref 135–145)
Total Bilirubin: 2.9 mg/dL — ABNORMAL HIGH (ref 0.3–1.2)
Total Bilirubin: 2.9 mg/dL — ABNORMAL HIGH (ref 0.3–1.2)
Total Protein: 6.5 g/dL (ref 6.5–8.1)
Total Protein: 6.4 g/dL — ABNORMAL LOW (ref 6.5–8.1)

## 2019-09-02 LAB — URINALYSIS, ROUTINE W REFLEX MICROSCOPIC
Bacteria, UA: NONE SEEN
Bilirubin Urine: NEGATIVE
Glucose, UA: >=500 mg/dL — AB
Ketones, ur: NEGATIVE mg/dL
Leukocytes,Ua: NEGATIVE
Nitrite: NEGATIVE
Protein, ur: 100 mg/dL — AB
RBC / HPF: >50 RBC/hpf — ABNORMAL HIGH (ref 0–5)
Specific Gravity, Urine: 1.029 (ref 1.005–1.030)
pH: 6 (ref 5.0–8.0)

## 2019-09-02 LAB — C-REACTIVE PROTEIN: CRP: 5 mg/dL — ABNORMAL HIGH (ref <1.0)

## 2019-09-02 LAB — FIBRIN DERIVATIVES D-DIMER (ARMC ONLY): Fibrin derivatives D-dimer (ARMC): 4423.74 ng/mL (FEU) — ABNORMAL HIGH (ref 0.00–499.00)

## 2019-09-02 MED ORDER — METHYLPREDNISOLONE SODIUM SUCC 40 MG IJ SOLR
40.0000 mg | INTRAMUSCULAR | Status: DC
  Administered 2019-09-03 – 2019-09-05 (×3): 40 mg via INTRAVENOUS
  Filled 2019-09-02 (×3): qty 1

## 2019-09-02 MED ORDER — OXYCODONE HCL 5 MG PO TABS: 5.0000 mg | ORAL_TABLET | Freq: Four times a day (QID) | ORAL | Status: DC | PRN

## 2019-09-02 MED ORDER — SODIUM ZIRCONIUM CYCLOSILICATE 10 G PO PACK
10.0000 g | PACK | Freq: Once | ORAL | Status: DC
  Filled 2019-09-02 (×2): qty 1

## 2019-09-02 NOTE — Progress Notes (Addendum)
Per Jonny Ruiz, family requests to come to bedside, as she may be near end of life. Discussed with charge RN and [facility], who confirms OK per policy; will contact family.  10:10 pm update:  Spoke to [name]; she and 1 other family member will come to bedside. She is aware that pt is covid positive, and they will need to dress in full PPE. We will assist them in doing so. E. Ouma notified.

## 2019-09-02 NOTE — Progress Notes (Signed)
PROGRESS NOTE  AKILI CUDA DPO:[id number] DOB: [birth year] DOA: 08/21/2019 PCP: [name], MD  Brief History   64 year old woman PMH COPD, cirrhosis, CHF presenting with shortness of breath, altered mental status.  Admitted for acute hypoxic respiratory failure secondary to COVID-19 pneumonia.  Treated with steroids, remdesivir.  Refused Actemra.  Seen by palliative care and pulmonology.  Partial code.  BiPAP if needed.  DNR.  No intubation.  Status remains tenuous and prognosis guarded.  A & P  Acute respiratory failure due to COVID-19 [facility]) --Appears better today, tolerating high flow nasal cannula, more conversant. --Completed ceftriaxone, remdesivir, azithromycin.  Refused Actemra. --Wean Solu-Medrol.  Refused proning. --Appreciate palliative medicine and pulmonology.  Other than BiPAP, no more advanced treatments recommended.  Chronic diastolic CHF (congestive heart failure) (HCC) --Echocardiogram normal LVEF.  Unable to determine diastolic function. --BNP was unremarkable. --Appears euvolemic. Fluid balance remains negative.  DM type 2 (diabetes mellitus, type 2) (HCC) Diabetes mellitus type 2.  Hemoglobin A1c 7.6. --CBG remains stable.  Continue sliding scale insulin. Holding Tradjenta and long-acting insulin given poor oral intake  Hypothyroidism Hypothyroidism --continue levothyroxine  Thrombocytopenia (HCC) --labile, secondary to acute illness. Follow with CBC. Plts better today.  Pneumonia due to COVID-19 virus Plan as above  Morbid obesity ([place]) --complicating acute illness. Recommendations per dietician.  Hyperkalemia --Hyperkalemia again noted with potassium at 6 but hemolysis noted. --adequate UOP, BUN and creatinine improved today --Lokelma x1 again today --Repeat CMP to obtain true potassium value  Elevated LFTs --AST/ALT up >1000 rapidly. Discussed w/ pharmacy, etiology unclear; no offending meds currently; could be ADR to remdesivir although  finished 8/14 would make this a delayed rxn. Has not been hypotensive. asymptomatic --Statin stopped --RUQ u/s 8/14 showed cirrhosis, gallbladder sludge --LFTs better today although hemolysis was noted.  Will repeat CMP.  Supportive care.  Hematuria --check u/a  Lactic acidosis.  Sepsis ruled out.  SIRS response secondary to Covid.  Was treated with empiric antibiotics but no definite bacterial infection discovered.  Hypothyroidism --Continue levothyroxine  Palliative care --Partial CODE STATUS per patient, BiPAP if needed.  Disposition Plan:  Discussion: Looks a little better today.  Tolerating high flow nasal cannula.  Continue present management but will decrease steroids.  Prognosis remains guarded.  Status is: Inpatient  Remains inpatient appropriate because:Inpatient level of care appropriate due to severity of illness   Dispo: The patient is from: Home              Anticipated d/c is to: TBD              Anticipated d/c date is: > 3 days              Patient currently is not medically stable to d/c.  DVT prophylaxis: enoxaparin  Code Status: limited Family Communication: Spoke with daughter-in-law 8/20 by telephone.  Will update later today.  Murray Hodgkins, MD  [facility] Direct contact: see [email or web address] (further directions at bottom of note if needed) 7PM-7AM contact night coverage as at bottom of note 09/02/2019, 4:08 PM  LOS: 11 days   Significant Hospital Events   .    Consults:  .    Procedures:  .   Significant Diagnostic Tests:  . COVID+   Micro Data:  . BC NG/F   Antimicrobials:  . Azithromycin 8/10 > 8/14  Interval History/Subjective  Better today Breathing fine No complaints Nursing reports hematuria  Objective   Vitals:  Vitals:   09/02/19 1100 09/02/19 1234  BP:  139/64  Pulse:  67  Resp:    Temp:  98.8 F (37.1 C)  SpO2: 93% 91%    Exam:  Constitutional:   . Appears calm and comfortable, much better today ENMT:    . grossly normal hearing  Respiratory:  . CTA bilaterally, no w/r/r.  . Respiratory effort normal.  Cardiovascular:  . RRR, no m/r/g . No LE extremity edema   Abdomen:  . Soft ntnd . Small open wound lower abdomen appears w/o significant change Psychiatric:  . Mental status o Mood, affect appropriate o Very alert  I have personally reviewed the following:   Today's Data  . I/O -3.2 . CBG remains stable . Potassium 5.6 > 6 (hemolysis noted) . Creatinine better at 1.13 . AP 83 > 386 > 344 > 382 . AST 83 > 386 > 2065 > 845 (hemolysis noted) . ALT 33 > 170 > 1113 > 804 (hemolysis noted) . T bili 2.2 > 2.9 . CRP trending down 6.4 > 4.5 (hemolysis noted) . Platelets labile 102 > 88 > 110 . Leukocytosis most likely from steroids, stable . Fibrin derivatives stable 4718 > 4301 > 4423  Scheduled Meds: . amLODipine  5 mg Oral Daily  . vitamin C  500 mg Oral Daily  . aspirin EC  81 mg Oral Daily  . collagenase   Topical Daily  . enoxaparin (LOVENOX) injection  0.5 mg/kg Subcutaneous Q24H  . feeding supplement (ENSURE ENLIVE)  237 mL Oral BID BM  . insulin aspart  0-20 Units Subcutaneous TID WC  . levothyroxine  88 mcg Oral Q0600  . [START ON 09/03/2019] methylPREDNISolone (SOLU-MEDROL) injection  40 mg Intravenous Q24H  . multivitamin with minerals  1 tablet Oral Daily  . oxybutynin  10 mg Oral Daily  . pantoprazole  40 mg Oral Daily  . polyethylene glycol  17 g Oral BID  . senna  1 tablet Oral QHS  . sodium chloride flush  3 mL Intravenous Q12H  . sodium zirconium cyclosilicate  10 g Oral Once  . zinc sulfate  220 mg Oral Daily   Continuous Infusions: . sodium chloride      Principal Problem:   Acute respiratory failure due to COVID-19 [facility]) Active Problems:   Pneumonia due to COVID-19 virus   Hyperkalemia   Elevated LFTs   Chronic diastolic CHF (congestive heart failure) ([place])   DM type 2 (diabetes mellitus, type 2) ([place])   Hypothyroidism   Thrombocytopenia  ([place])   Essential hypertension   Morbid obesity ([place])   DNR (do not resuscitate) discussion   Palliative care by specialist   Hypoxia   Hematuria   LOS: 11 days   How to contact the [facility] Attending or Consulting provider [place] or covering provider during after hours [place], for this patient?  1. Check the care team in [facility] and look for a) attending/consulting TRH provider listed and b) the [facility] team listed 2. Log into [email or web address] and use [place]'s universal password to access. If you do not have the password, please contact the hospital operator. 3. Locate the [facility] provider you are looking for under [facility] and page to a number that you can be directly reached. 4. If you still have difficulty reaching the provider, please page the [facility] (Director on Call) for the Hospitalists listed on amion for assistance.

## 2019-09-02 NOTE — Assessment & Plan Note (Addendum)
--  Etiology unclear, no further testing per family.

## 2019-09-02 NOTE — Progress Notes (Signed)
Spoke with E. Ouma, as pt appears with diminished mental status, as well as decreased O2 sats, increased resp rate. Other VSS. Moans, but does not answer any questions. Continues on HFNC, NRB added, with improved O2 sats. RT in to see pt. Per E. Ouma, she will contact pt's daughter in law regarding plan of care moving forward.

## 2019-09-03 ENCOUNTER — Inpatient Hospital Stay: Payer: Medicare Other

## 2019-09-03 LAB — COMPREHENSIVE METABOLIC PANEL
ALT: 631 U/L — ABNORMAL HIGH (ref 0–44)
AST: 463 U/L — ABNORMAL HIGH (ref 15–41)
Albumin: 1.7 g/dL — ABNORMAL LOW (ref 3.5–5.0)
Alkaline Phosphatase: 371 U/L — ABNORMAL HIGH (ref 38–126)
Anion gap: 7 (ref 5–15)
BUN: 46 mg/dL — ABNORMAL HIGH (ref 8–23)
CO2: 26 mmol/L (ref 22–32)
Calcium: 8 mg/dL — ABNORMAL LOW (ref 8.9–10.3)
Chloride: 112 mmol/L — ABNORMAL HIGH (ref 98–111)
Creatinine, Ser: 1.07 mg/dL — ABNORMAL HIGH (ref 0.44–1.00)
GFR calc Af Amer: >60 mL/min (ref >60)
GFR calc non Af Amer: 56 mL/min — ABNORMAL LOW (ref >60)
Glucose, Bld: 209 mg/dL — ABNORMAL HIGH (ref 70–99)
Potassium: 5.6 mmol/L — ABNORMAL HIGH (ref 3.5–5.1)
Sodium: 145 mmol/L (ref 135–145)
Total Bilirubin: 3 mg/dL — ABNORMAL HIGH (ref 0.3–1.2)
Total Protein: 6.5 g/dL (ref 6.5–8.1)

## 2019-09-03 LAB — GLUCOSE, CAPILLARY
Glucose-Capillary: 187 mg/dL — ABNORMAL HIGH (ref 70–99)
Glucose-Capillary: 174 mg/dL — ABNORMAL HIGH (ref 70–99)
Glucose-Capillary: 155 mg/dL — ABNORMAL HIGH (ref 70–99)
Glucose-Capillary: 155 mg/dL — ABNORMAL HIGH (ref 70–99)

## 2019-09-03 LAB — CBC WITH DIFFERENTIAL/PLATELET
Abs Immature Granulocytes: 0.25 10*3/uL — ABNORMAL HIGH (ref 0.00–0.07)
Basophils Absolute: 0.1 10*3/uL (ref 0.0–0.1)
Basophils Relative: 0 %
Eosinophils Absolute: 0 10*3/uL (ref 0.0–0.5)
Eosinophils Relative: 0 %
HCT: 46.2 % — ABNORMAL HIGH (ref 36.0–46.0)
Hemoglobin: 15.7 g/dL — ABNORMAL HIGH (ref 12.0–15.0)
Immature Granulocytes: 2 %
Lymphocytes Relative: 3 %
Lymphs Abs: 0.5 10*3/uL — ABNORMAL LOW (ref 0.7–4.0)
MCH: 31.6 pg (ref 26.0–34.0)
MCHC: 34 g/dL (ref 30.0–36.0)
MCV: 93 fL (ref 80.0–100.0)
Monocytes Absolute: 0.8 10*3/uL (ref 0.1–1.0)
Monocytes Relative: 6 %
Neutro Abs: 12.7 10*3/uL — ABNORMAL HIGH (ref 1.7–7.7)
Neutrophils Relative %: 89 %
Platelets: 92 10*3/uL — ABNORMAL LOW (ref 150–400)
RBC: 4.97 MIL/uL (ref 3.87–5.11)
RDW: 16.9 % — ABNORMAL HIGH (ref 11.5–15.5)
WBC: 14.8 10*3/uL — ABNORMAL HIGH (ref 4.0–10.5)
nRBC: 0.3 % — ABNORMAL HIGH (ref 0.0–0.2)

## 2019-09-03 MED ORDER — SODIUM CHLORIDE 0.9 % IV SOLN: INTRAVENOUS | Status: DC

## 2019-09-03 MED ORDER — MORPHINE SULFATE (PF) 2 MG/ML IV SOLN
1.0000 mg | INTRAVENOUS | Status: DC | PRN
  Administered 2019-09-03: 1 mg via INTRAVENOUS
  Filled 2019-09-03: qty 1

## 2019-09-03 MED ORDER — OXYCODONE HCL 5 MG PO TABS: 5.0000 mg | ORAL_TABLET | Freq: Four times a day (QID) | ORAL | Status: DC | PRN

## 2019-09-03 MED ORDER — MORPHINE SULFATE (PF) 2 MG/ML IV SOLN
2.0000 mg | INTRAVENOUS | Status: DC | PRN
  Administered 2019-09-03 – 2019-09-09 (×19): 2 mg via INTRAVENOUS
  Filled 2019-09-03 (×19): qty 1

## 2019-09-03 MED ORDER — SODIUM CHLORIDE 0.9 % IV BOLUS
500.0000 mL | Freq: Once | INTRAVENOUS | Status: AC
  Administered 2019-09-03: 500 mL via INTRAVENOUS

## 2019-09-03 NOTE — Progress Notes (Signed)
PROGRESS NOTE  Jennifer Mosley EVO:[id number] DOB: 02-Sep-1955 DOA: 08/16/2019 PCP: [name], MD  Brief History   64 year old woman PMH COPD, cirrhosis, CHF presenting with shortness of breath, altered mental status.  Admitted for acute hypoxic respiratory failure secondary to COVID-19 pneumonia.  Treated with steroids, remdesivir.  Refused Actemra.  Seen by palliative care and pulmonology.   DNR.  No intubation or BiPAP.  Prognosis grim, may not survive hospitalization.  A & P  Acute respiratory failure due to COVID-19 [facility]) --Appears worse today.  Tolerating high flow nasal cannula nonrebreather.   --Completed ceftriaxone, remdesivir, azithromycin.  Refused Actemra. --Continue Solu-Medrol. --Appreciate palliative medicine and pulmonology consultations earlier in hospitalization --Patient with family.  No BiPAP or escalation of care.  Chronic diastolic CHF (congestive heart failure) (HCC) --Echocardiogram normal LVEF.  Unable to determine diastolic function. --BNP was unremarkable. --Appears euvolemic. Fluid balance remains negative.  DM type 2 (diabetes mellitus, type 2) (HCC) Diabetes mellitus type 2.  Hemoglobin A1c 7.6. --CBG remains stable.  Continue sliding scale insulin. Holding Tradjenta and long-acting insulin given poor oral intake  Hypothyroidism Hypothyroidism --continue levothyroxine  Thrombocytopenia (HCC) --labile, secondary to acute illness. Follow with CBC. Plts better today.  Pneumonia due to COVID-19 virus Plan as above  Morbid obesity ([place]) --complicating acute illness. Recommendations per dietician.  Hyperkalemia --Again hemolysis is noted, potassium only mildly elevated, almost certainly lower.  Monitor clinically.  Elevated LFTs --AST/ALT up >1000 rapidly. Discussed w/ pharmacy, etiology unclear; no offending meds currently; could be ADR to remdesivir although finished 8/14 would make this a delayed rxn. Has not been hypotensive.  asymptomatic --Statin stopped --RUQ u/s 8/14 showed cirrhosis, gallbladder sludge --LFTs slowly trending down.  Total bilirubin about the same at 3.  Discussed in detail with family member.  I think this is most likely related to acute illness, congestive hepatopathy.  No clear signs or symptoms of acute gallbladder disease.  We discussed further imaging and work-up, family declines any further work-up.  They understand the differential and uncertainty.  Hematuria --check u/a  Lactic acidosis.  Sepsis ruled out.  SIRS response secondary to Covid.  Was treated with empiric antibiotics but no definite bacterial infection discovered.  Hypothyroidism --Continue levothyroxine  Palliative care --Partial CODE STATUS per patient, BiPAP if needed.  Disposition Plan:  Discussion: Much worse today, also with a difficult night.  She now is poorly responsive and seems to have generalized abdominal pain.  Imaging of the abdomen unremarkable.  Chest x-ray worse.  At this point I do not think she will survive this hospitalization and will succumb to Covid.  I have discussed in detail with daughter-in-law today.  Continue current care but no escalation of care.  No further investigation efforts of abdominal pain as per family.  Treat pain.  Recommend comfort measures.  Will discuss again with family.  Status is: Inpatient  Remains inpatient appropriate because:Inpatient level of care appropriate due to severity of illness   Dispo: The patient is from: Home              Anticipated d/c is to: TBD              Anticipated d/c date is: > 3 days              Patient currently is not medically stable to d/c.  DVT prophylaxis: enoxaparin  Code Status: limited Family Communication: Spoke with daughter-in-law   Murray Hodgkins, MD  [facility] Direct contact: see [email or web address] (further directions  at bottom of note if needed) 7PM-7AM contact night coverage as at bottom of note 09/03/2019, 2:16 PM  LOS:  12 days   Significant Hospital Events   .    Consults:  .    Procedures:  .   Significant Diagnostic Tests:  . COVID+   Micro Data:  . BC NG/F   Antimicrobials:  . Azithromycin 8/10 > 8/14  Interval History/Subjective  Overnight events noted Moaning this AM intermittently. Does not respond meaningfully  Objective   Vitals:  Vitals:   09/03/19 0809 09/03/19 1210  BP: (!) 158/90 (!) 156/85  Pulse: 74 63  Resp: 19 19  Temp: 98.7 F (37.1 C) 97.6 F (36.4 C)  SpO2: 100% 94%    Exam:  Constitutional:   . Appears anxious, ill, uncomfortable, critically ill Respiratory:  . CTA bilaterally, no w/r/r.  . Respiratory effort moderately increased Cardiovascular:  . RRR, no m/r/g . No LE extremity edema   Abdomen:  . Obese soft nd . Diffuse generalized tenderness . Small open wound on abdomen appears to be healing.  No signs of complicating features. Musculoskeletal:  . Cannot assess Skin:  . No rashes Psychiatric:  . Mental status . Cannot assess . Altered doesn't follow commands   I have personally reviewed the following:   Today's Data  . UOP 300 . I/O -3510 . CBG stable . Plts stable 92 . Potassium 5.6 but hemolysis noted, almost certainly lower.  AST and ALT trending down. Marland Kitchen Urinalysis abnormal with hematuria . Abdominal x-ray unremarkable . Chest x-ray showed worsening Covid  Scheduled Meds: . vitamin C  500 mg Oral Daily  . aspirin EC  81 mg Oral Daily  . collagenase   Topical Daily  . enoxaparin (LOVENOX) injection  0.5 mg/kg Subcutaneous Q24H  . feeding supplement (ENSURE ENLIVE)  237 mL Oral BID BM  . insulin aspart  0-20 Units Subcutaneous TID WC  . levothyroxine  88 mcg Oral Q0600  . methylPREDNISolone (SOLU-MEDROL) injection  40 mg Intravenous Q24H  . oxybutynin  10 mg Oral Daily  . pantoprazole  40 mg Oral Daily  . polyethylene glycol  17 g Oral BID  . senna  1 tablet Oral QHS  . sodium chloride flush  3 mL Intravenous Q12H  .  sodium zirconium cyclosilicate  10 g Oral Once  . zinc sulfate  220 mg Oral Daily   Continuous Infusions: . sodium chloride    . sodium chloride 75 mL/hr at 09/03/19 1139    Principal Problem:   Acute respiratory failure due to COVID-19 [facility]) Active Problems:   Pneumonia due to COVID-19 virus   Hyperkalemia   Elevated LFTs   Chronic diastolic CHF (congestive heart failure) ([place])   DM type 2 (diabetes mellitus, type 2) ([place])   Hypothyroidism   Thrombocytopenia ([place])   Essential hypertension   Morbid obesity ([place])   DNR (do not resuscitate) discussion   Palliative care by specialist   Hypoxia   Hematuria   LOS: 12 days   How to contact the [facility] Attending or Consulting provider [place] or covering provider during after hours [place], for this patient?  1. Check the care team in [facility] and look for a) attending/consulting TRH provider listed and b) the [facility] team listed 2. Log into [email or web address] and use [place]'s universal password to access. If you do not have the password, please contact the hospital operator. 3. Locate the [facility] provider you are looking for under [facility]  and page to a number that you can be directly reached. 4. If you still have difficulty reaching the provider, please page the [facility] (Director on Call) for the Hospitalists listed on amion for assistance.

## 2019-09-03 NOTE — Progress Notes (Addendum)
    BRIEF OVERNIGHT PROGRESS REPORT NAME: Jennifer Mosley. [place] MRN:  [id number] DOB:  [birth year]        ADMISSION DATE:  09/04/2019  BRIEF PATIENT DESCRIPTION:  64y.o. Female admitted with Acute Hypoxic Respiratory Failure in the setting of COVID-19 Pneumonia.  Pt with increasing FiO2 requirements, failed trial of nonrebreather and high flow nasal cannula.  Subsequently placed on BiPAP but did not tolerate and placed back on HHFNC.  SIGNIFICANT EVENTS  8/10: Presented to ED with c/o shortness of breath and hypoxia sats 48% on R.A per EMS, 8/10: Patient started on Remdesivir and steroids 8/11: elevated CRP and requiring 50L with heated HFNC + NRB.  Does not want to get Actemra at this time due to her prior liver disease. 8/11: Admitted to Progressive cardiac unit Sep 07, 2022: Palliative care was consulted as patient is very high risk for deterioration and death. 09/07/2022: DNR/DNI was discussed and a partial code was documented.  POA very clear that the patient would not want intubation, compression or cardioversion but would be open to utilization of BiPAP 09/07/2022: PCCM consulted due to worsening respiratory failure 8/18: Worsening hypoxia, requiring BiPAP 8/19: Pt not tolerating bipap, constantly pulling off face. Placed on heated HFNC at 100% 8/20:Worsening hypoxia  needing 15L NRB and Heated HF to maintain SPO2 above 85%  STUDIES:  8/10: CXR>>Cardiomegaly with pulmonary vascular congestion 8/12: CTA >>without PE but extensive bilateral groundglass opacities compatible with COVID-19 pneumonia.  CULTURES: Blood Cx /10: Negative thus far  ANTIBIOTICS: Remdesivir 8/10>> Azithromycin 8/10>> Ceftriaxone 8/10>>  SUBJECTIVE: Per patient's RN, pt appears with diminished mental status, as well as decreased O2 sats, increased resp rate. Other VSS. Moans, but does not answer any questions  OBJECTIVE: She is afebrile with blood pressure 143/85 mm Hg and pulse rate 66 beats/min. There were no focal  neurological deficits; she was lethargic with minimal responsiveness.    ASSESSMENT AND PLAN  Acute Hypoxic Respiratory Failure secondary to severe COVID-19 Pneumonia - Supplemental O2 as needed to maintain O2 saturations greater than 85% on HHFNC - Unable to tolerate BiPAP (appears uncomfortable) - As needed bronchodilators - Antitussives - Refused Self proning  - Completed ceftriaxone, remdesivir, azithromycin - Decined Actemra at this time due to her prior liver disease. - Weaning steroids - Continue Vitamin C and Zinc - Maintain Airborne and contact precautions   GOALS OF CARE: I again had a discussion with patient's son and daughter in-law regarding guarded prognosis. Given change in mental status and continued hypoxia, family were allowed at the bedside to visit and discuss further goals of care. At this time they do not want to proceed with any aggressive medical management including  Additional test and use of BiPAP if respiratory decompensates. They would like to continue to treat the treatable and make patient comfortable as possible. Should her conditions deteriorates in the next 24 hours, family would like to be contacted to further discuss transitioning to comfort measures only.     Rufina Falco, BSN, MSN, DNP, [employer]  [facility] Nurse Practitioner  [facility]

## 2019-09-03 NOTE — Progress Notes (Signed)
Spoke with [facility]) and MD Sarajane Jews about visitation for family. AC made allowance one time visit today for 3 family members for 15-20 minutes. Family to come: 2 sons and husband at 35 today. MD at beside during 1800 med pass. We spke about patient's status. MD Sarajane Jews increased Morphine dose for increased pain control. Patient speech incomprehensible with only moaning sounds. Will open eyes to voice. Poor prognosis for patient per MD evaluation of status. Will assist family once on unit to confirm proper PPE and visitation guidelines. Will continue to monitor patient. Patient at this time is not comfort care. Deneise Lever is daughter in Sports coach and [place]. At this time she is not comfortable making the patient comfort care. MD said he will continue to address correct level of care for patient with POA.

## 2019-09-03 NOTE — Progress Notes (Signed)
Patient's son and daughter in law visited with patient and consulted with E. Ouma afterwards. Family appeciative for opportunity to visit, and stated that patient's mental status much worse than previously. Will CTM and update family as needed. Will await orders from E. Ouma for comfort measures, as pt unable to take po pain meds at this time.

## 2019-09-03 NOTE — Care Plan (Addendum)
Advance Care Planning   Purpose of Encounter Discuss critical illness, poor prognosis, goals of care   Parties in Attendance Daughter-in-law, decisionmaker Deneise Lever by phone  Discussion:  Discussed in detail including current diagnoses, clinical condition, diagnostic uncertainty, worsening CXR, abd pain, lethargy, declining clinical status, abnormal liver tests of uncertain etiology.   treatment plan and prognosis.  All questions answered.   Discussed poor prognosis, at this point I suspect patient will not survive hospitalization and that comfort care is appropriate.  We discussed the meaning/implications of this. Deneise Lever will discuss w/ family and we will talk again later. She has a good understanding of current illness and gravity of condition.   GOC: No change at this point: DNR, no BiPAP, no escalation of care.  ADDENDUM Discussed again 1520. Morphine for pain. No labs or further diagnostic workup. However pt is not comfort care.   Murray Hodgkins, MD [facility]

## 2019-09-03 NOTE — Progress Notes (Addendum)
Patient appears to be stable on high flow and non-rebreather. Stats sustaining in 90s throughout the day. Spoke with daughter in law [place] about patient's status. Deneise Lever and family are not ready to make patient comfort care at this time. They do however want to visit. Visitation policy explained to her-NO visitors for Covid+ patients. Wadie Lessen from palliative spoke with me and she was contacting [place] as well. Deneise Lever is the POA for patient. MD Sarajane Jews ordered 1mg  IV morphine to control patient's pain. Patient continuously moans as in pain throughout the day and reported by night shift as the same. Will continue to assess and monitor her status.

## 2019-09-04 LAB — GLUCOSE, CAPILLARY
Glucose-Capillary: 166 mg/dL — ABNORMAL HIGH (ref 70–99)
Glucose-Capillary: 146 mg/dL — ABNORMAL HIGH (ref 70–99)
Glucose-Capillary: 130 mg/dL — ABNORMAL HIGH (ref 70–99)
Glucose-Capillary: 155 mg/dL — ABNORMAL HIGH (ref 70–99)

## 2019-09-04 MED ORDER — ENSURE ENLIVE PO LIQD
237.0000 mL | Freq: Three times a day (TID) | ORAL | Status: DC
  Administered 2019-09-04 – 2019-09-05 (×2): 237 mL via ORAL

## 2019-09-04 NOTE — Progress Notes (Signed)
PROGRESS NOTE  Jennifer Mosley WFU:[id number] DOB: 04-23-1955 DOA: 08/16/2019 PCP: [name], MD  Brief History   64 year old woman PMH COPD, cirrhosis, CHF presenting with shortness of breath, altered mental status.  Admitted for acute hypoxic respiratory failure secondary to COVID-19 pneumonia.  Treated with steroids, remdesivir.  Refused Actemra.  Seen by palliative care and pulmonology.   DNR.  No intubation or BiPAP.  Prognosis grim, may not survive hospitalization.  A & P  Acute respiratory failure due to COVID-19 (HCC) --Tolerating high flow nasal cannula nonrebreather.  Clinically however appears to worsen daily. --Completed ceftriaxone, remdesivir, azithromycin.  Refused Actemra. --Continue Solu-Medrol. --Appreciate palliative medicine and pulmonology consultations earlier in hospitalization --No BiPAP or escalation of care. --Prognosis grim  Chronic diastolic CHF (congestive heart failure) (HCC) --Echocardiogram normal LVEF.  Unable to determine diastolic function. --BNP was unremarkable. --Fluid balance negative.  Minimal lower extremity edema.  DM type 2 (diabetes mellitus, type 2) ([place]) Oral intakeDiabetes mellitus type 2.  Hemoglobin A1c 7.6. --CBG remains stable.  Continue sliding scale insulin. Holding Tradjenta and long-acting insulin given no significant oral intake  Hypothyroidism Hypothyroidism --continue levothyroxine  Thrombocytopenia (HCC) --labile, secondary to acute illness.  Goals of care with daughter-in-law, no further testing.  Pneumonia due to COVID-19 virus Plan as above --No escalation of care  Morbid obesity ([place]) --complicating acute illness. Recommendations per dietician.  Hyperkalemia --Again hemolysis is noted, potassium only mildly elevated, almost certainly lower.  Monitor clinically. --Discussed with family, no further investigation or laboratory studies.  Elevated LFTs --AST/ALT up >1000 rapidly. Discussed w/ pharmacy,  etiology unclear; no offending meds currently; could be ADR to remdesivir although finished 8/14 would make this a delayed rxn. Has not been hypotensive. asymptomatic --Statin stopped --RUQ u/s 8/14 showed cirrhosis, gallbladder sludge --LFTs trended down.  Total bilirubin was stable..Discussed in detail with family member.  I think this is most likely related to acute illness, congestive hepatopathy or cholestasis from critical illness.  No clear signs or symptoms of acute gallbladder disease.  We discussed further imaging and work-up, family declines any further work-up.  They understand the differential and uncertainty.  Hematuria --Etiology unclear, no further testing per family.  Lactic acidosis.  Sepsis ruled out.  SIRS response secondary to Covid.  Was treated with empiric antibiotics but no definite bacterial infection discovered.  Hypothyroidism --Continue levothyroxine  Palliative care --Partial CODE STATUS per patient, BiPAP if needed.  Disposition Plan:  Discussion: Continues to worsen.  Tolerating oxygen but not eating.  Encephalopathic, not clearly in pain.  We will continue morphine as needed.  Per goals of care, scale without medications towards comfort.  Patient is not yet comfort care though.  Discussed in detail with daughter-in-law this morning, will update this afternoon.  Goals of care at this time no escalation of care, no BiPAP, no laboratory studies or imaging, no diagnostic work-up.  Focus on oxygen and pain control.  Daughter-in-law is interested in hospice.  Unclear whether oxygen needs can be provided in route and add hospice, will explore with TOC.  Patient remains critically ill, do not think she will survive this illness  Status is: Inpatient  Remains inpatient appropriate because:Inpatient level of care appropriate due to severity of illness   Dispo: The patient is from: Home              Anticipated d/c is to: TBD              Anticipated d/c date is: > 3  days              Patient currently is not medically stable to d/c.  DVT prophylaxis: enoxaparin  Code Status: limited Family Communication: Spoke with daughter-in-law by phone 8/23  Murray Hodgkins, MD  [facility] Direct contact: see [email or web address] (further directions at bottom of note if needed) 7PM-7AM contact night coverage as at bottom of note 09/04/2019, 12:46 PM  LOS: 13 days   Significant Hospital Events   .    Consults:  .    Procedures:  .   Significant Diagnostic Tests:  . COVID+   Micro Data:  . BC NG/F   Antimicrobials:  . Azithromycin 8/10 > 8/14  Interval History/Subjective  Not responsive Moaning overnight Unclear if responding to morphine  Objective   Vitals:  Vitals:   09/04/19 0817 09/04/19 1210  BP: (!) 153/85 (!) 160/72  Pulse: (!) 56 67  Resp: 19 19  Temp: 98.1 F (36.7 C) 98 F (36.7 C)  SpO2: 93% 95%    Exam:  Constitutional:   . Appears critically ill, encephalopathic . Eyes open, fidgeting, not following commands or responding appropriately ENMT:  . Cannot assess hearing  Respiratory:  . CTA bilaterally, no w/r/r.  . Respiratory effort mildly increased Cardiovascular:  . RRR, no m/r/g . 1+ bilateral pretibial LE extremity edema   Abdomen:  . Soft, nd, not clearly tender . Appears unchanged . Small open area lower pannus unchanged; appears uncomplicated Skin:  . No rashes, lesions, ulcers except as above Psychiatric:  . Mental status . Confused, not responsive   I have personally reviewed the following:   Today's Data  . CBG stable  Scheduled Meds: . collagenase   Topical Daily  . enoxaparin (LOVENOX) injection  0.5 mg/kg Subcutaneous Q24H  . insulin aspart  0-20 Units Subcutaneous TID WC  . methylPREDNISolone (SOLU-MEDROL) injection  40 mg Intravenous Q24H  . senna  1 tablet Oral QHS  . sodium chloride flush  3 mL Intravenous Q12H   Continuous Infusions: . sodium chloride      Principal Problem:    Acute respiratory failure due to COVID-19 [facility]) Active Problems:   Pneumonia due to COVID-19 virus   Hyperkalemia   Elevated LFTs   Chronic diastolic CHF (congestive heart failure) ([place])   DM type 2 (diabetes mellitus, type 2) ([place])   Hypothyroidism   Thrombocytopenia ([place])   Essential hypertension   Morbid obesity ([place])   DNR (do not resuscitate) discussion   Palliative care by specialist   Hematuria   LOS: 13 days   How to contact the [facility] Attending or Consulting provider [place] or covering provider during after hours [place], for this patient?  1. Check the care team in [facility] and look for a) attending/consulting TRH provider listed and b) the [facility] team listed 2. Log into [email or web address] and use [place]'s universal password to access. If you do not have the password, please contact the hospital operator. 3. Locate the [facility] provider you are looking for under [facility] and page to a number that you can be directly reached. 4. If you still have difficulty reaching the provider, please page the [facility] (Director on Call) for the Hospitalists listed on amion for assistance.

## 2019-09-04 NOTE — Progress Notes (Signed)
Patient ID: Jennifer Mosley, female   DOB: 1955/09/30, 64 y.o.   MRN: [id number]  This NP reviewed medical records received report from team and discussed care with patient's bedside RN/Matthew.  Patient is weak, dyspneic and requiring high levels of oxygen to maintain O2 saturations.  Sinew communication with main decision maker Jennifer Mosley/sister-in-law .  Continued education regarding current medical situation.  Patient is high risk for decompensation  Education offered on the difference between an aggressive medical intervention path and a palliative comfort path for this patient at this time in this situation.  Natural  trajectory and expectations at end of life was discussed , education offered on the utilization of medications for comfort at end-of-life.  Questions and concerns addressed   For now plan is to continue with current medical situations, family remains hopeful for improvement.  Will make decisions 1 day at a time.   Emotional support offered.  Family was able to visit over the weekend  Discussed with Deneise Lever the importance of continued conversation with all family and the medical providers regarding overall plan of care and treatment options,  ensuring decisions are within the context of the patients values and GOCs.  Discussed with Dr Sarajane Jews   PMT will continue to support holistically.  Total time spent on the unit was 25 miutes  Greater than 50% of the time was spent in counseling and coordination of care  Wadie Lessen NP  Palliative Medicine Team Team Phone # [phone] Pager [phone]

## 2019-09-04 NOTE — Progress Notes (Signed)
When shift started, 2 sons and husband were in the room. Day shift let night shift Rn know that it was time for them to leave. They left with no problems. Throughout the evening, the pt continued to moan. When asked was she in pain,pt would just moan louder and at time talk, but was incomprehensible with language. Morphine has been increased from 1 mg to 2 mg. Pt continue to sat in the 90's on the HFNC with the nonrebreather.

## 2019-09-05 LAB — GLUCOSE, CAPILLARY
Glucose-Capillary: 137 mg/dL — ABNORMAL HIGH (ref 70–99)
Glucose-Capillary: 156 mg/dL — ABNORMAL HIGH (ref 70–99)
Glucose-Capillary: 141 mg/dL — ABNORMAL HIGH (ref 70–99)
Glucose-Capillary: 210 mg/dL — ABNORMAL HIGH (ref 70–99)

## 2019-09-05 LAB — CREATININE, SERUM
Creatinine, Ser: 0.98 mg/dL (ref 0.44–1.00)
GFR calc Af Amer: >60 mL/min (ref >60)
GFR calc non Af Amer: >60 mL/min (ref >60)

## 2019-09-05 MED ORDER — METHYLPREDNISOLONE SODIUM SUCC 40 MG IJ SOLR
20.0000 mg | INTRAMUSCULAR | Status: DC
  Administered 2019-09-06 – 2019-09-08 (×3): 20 mg via INTRAVENOUS
  Filled 2019-09-05 (×3): qty 1

## 2019-09-05 NOTE — Progress Notes (Signed)
   09/05/19 0400  Assess: MEWS Score  ECG Heart Rate 86  Resp (!) 27  Assess: MEWS Score  MEWS Temp 0  MEWS Systolic 0  MEWS Pulse 0  MEWS RR 2  MEWS LOC 0  MEWS Score 2  MEWS Score Color Yellow  Assess: if the MEWS score is Yellow or Red  Were vital signs taken at a resting state? Yes  Focused Assessment No change from prior assessment  Early Detection of Sepsis Score *See Row Information* Low  MEWS guidelines implemented *See Row Information* No, vital signs rechecked (pt occasionally tachypneic, will recheck RR)

## 2019-09-05 NOTE — Progress Notes (Signed)
Nutrition Follow-Up Note   DOCUMENTATION CODES:   Obesity unspecified  INTERVENTION:   RD will continue to monitor for GOC  NUTRITION DIAGNOSIS:   Inadequate oral intake related to acute illness (COVID 19) as evidenced by meal completion < 25%.  GOAL:   Patient will meet greater than or equal to 90% of their needs  -not met   MONITOR:   PO intake, Weight trends, Skin  ASSESSMENT:   63 year old woman PMH COPD, cirrhosis, CHF presenting with shortness of breath, altered mental status.  Admitted for acute hypoxic respiratory failure secondary to COVID-19 pneumonia.   Pt continues to have poor appetite and oral intake. ONS discontinued my MD. Pt with poor prognosis; family leaning towards comfort care but has not yet made a final decision. Family requesting no escalation of care. Feeding tube not indicated per MD. RD will continue to monitor for GOC.   Medications reviewed and include:  lovenox, solu-medrol, senokot  Labs reviewed: cbgs- 137, 156 x 24 hrs  Diet Order:   Diet Order            Diet regular Room service appropriate? Yes; Fluid consistency: Thin  Diet effective now                EDUCATION NEEDS:   Not appropriate for education at this time  Skin:  Skin Assessment: Reviewed RN Assessment (abdomen 8X.8X.2cm, MASD, ecchymosis)  Last BM:  8/22- TYPE 5  Height:   Ht Readings from Last 1 Encounters:  08/23/19 _0  (1.626 m)    Weight:   Wt Readings from Last 1 Encounters:  09/05/19 102.6 kg    Ideal Body Weight:  54.5 kg  BMI:  Body mass index is 38.81 kg/m.  Estimated Nutritional Needs:   Kcal:  2100-2400kcal/day  Protein:  105-120g/day  Fluid:  1.6-1.9L/day  Koleen Distance MS, RD, LDN Please refer to [facility] for RD and/or RD on-call/weekend/after hours pager

## 2019-09-05 NOTE — Progress Notes (Signed)
PROGRESS NOTE  Jennifer Mosley CMK:[id number] DOB: 05/10/1955 DOA: 08/19/2019 PCP: [name], MD  Brief History   64 year old woman PMH COPD, cirrhosis, CHF presented with shortness of breath, altered mental status.  Admitted for acute hypoxic respiratory failure secondary to COVID-19 pneumonia.  Treated with steroids, remdesivir.  Refused Actemra.  Condition failed to improve, remained with high oxygen requirement.  Seen by palliative care and pulmonology.  Unfortunately little more to offer.  She has remained on high flow nasal cannula with nonrebreather.  She was trialed on BiPAP but ultimately was unable to tolerate it.  DNR.  No intubation.  After further discussion, family decided against BiPAP, no further escalation of care..  At this point the patient continues to require high flow oxygen with nonrebreather and does not appear that she will recover from Covid.  Expect in-hospital death.  Unfortunately hospice not accepting Covid patients.  Daily discussions with daughter-in-law for goals of care.  Not yet comfort care  A & P  Acute respiratory failure due to COVID-19 [facility] --Still tolerating high flow nasal cannula nonrebreather.  Worsening daily.  Encephalopathic.  Not following commands. --Completed ceftriaxone, remdesivir, azithromycin.  Refused Actemra. --Decrease Solu-Medrol. --Appreciate palliative medicine and pulmonology consultations earlier in hospitalization --No BiPAP or escalation of care. --Prognosis grim  Chronic diastolic CHF (congestive heart failure) (HCC) --Echocardiogram normal LVEF.  Unable to determine diastolic function. --BNP was unremarkable. --Fluid balance negative.  Minimal lower extremity edema.  DM type 2 (diabetes mellitus, type 2) (HCC) Hemoglobin A1c 7.6. --CBG remains stable.  Stopped sliding scale insulin.  Stopped Tradjenta and long-acting insulin given no significant oral intake  Hypothyroidism Hypothyroidism --will stop levothyroxine  based on goals of care  Thrombocytopenia (HCC) --labile, secondary to acute illness.  Goals of care with daughter-in-law, no further testing.  Pneumonia due to COVID-19 virus Plan as above --No escalation of care  Morbid obesity ([place]) --complicating acute illness. Recommendations per dietician.  Hyperkalemia --Spurious with hemolysis noted. --Discussed with family, no further investigation or laboratory studies.  Elevated LFTs --AST/ALT up >1000 rapidly. Discussed w/ pharmacy, etiology unclear; no offending meds currently; could be ADR to remdesivir although finished 8/14 would make this a delayed rxn. Has not been hypotensive. asymptomatic --Statin stopped --RUQ u/s 8/14 showed cirrhosis, gallbladder sludge --LFTs trending down.  Total bilirubin was stable..Discussed in detail with family member.  I think this is most likely related to acute illness, congestive hepatopathy or cholestasis from critical illness.  No clear signs or symptoms of acute gallbladder disease.  We discussed further imaging and work-up, family declines any further work-up.  They understand the differential and uncertainty.  Hematuria --Etiology unclear, no further testing per family.  Lactic acidosis.  Sepsis ruled out.  SIRS response secondary to Covid.  Was treated with empiric antibiotics but no definite bacterial infection discovered.  Disposition Plan:  Discussion: Condition continues to worsen daily.  Encephalopathic, not following commands and no significant oral intake.  Recovery not expected at this point.  Daily discussions with daughter-in-law, discussed again this morning.  I have recommended transition to comfort care and consideration of removal of supplementation of oxygen.  We will follow-up with daughter-in-law for goals of care.  At this point goals of care: No escalation of care, no BiPAP, no laboratory testing, no imaging, no medications directed at care other than comfort.  Status is:  Inpatient  Remains inpatient appropriate because:Inpatient level of care appropriate due to severity of illness   Dispo: The patient is from:  Home              Anticipated d/c is to: TBD              Anticipated d/c date is: > 3 days              Patient currently is not medically stable to d/c.  DVT prophylaxis: enoxaparin  Code Status: limited Family Communication: Spoke with daughter-in-law by phone 8/24  Murray Hodgkins, MD  [facility] Direct contact: see [email or web address] (further directions at bottom of note if needed) 7PM-7AM contact night coverage as at bottom of note 09/05/2019, 3:18 PM  LOS: 14 days    Consults:  . PMT . Pulmonology    Significant Diagnostic Tests:  . COVID+   Micro Data:  . BC NG/F   Antimicrobials:  . Azithromycin 8/10 > 8/14  Interval History/Subjective  Pt not able to offer any history No apparent change overnight  Objective   Vitals:  Vitals:   09/05/19 1221 09/05/19 1436  BP: (!) 181/91   Pulse: 79   Resp: 18   Temp: 97.7 F (36.5 C)   SpO2: 90% (!) 84%    Exam:  Constitutional:   . Appears encephalopathic, confused, critically ill Eyes:  . Open ENMT:  . Cannot assess hearing Respiratory:  . CTA bilaterally, no w/r/r.  . Respiratory effort normal.  Cardiovascular:  . RRR, no m/r/g . 1+ bilateral LE extremity edema   Abdomen:  . Obese, soft, nondistended.  Small wound lower abdomen without change.  Perhaps mild generalized tenderness but difficult to say. Musculoskeletal:  . Does not follow commands or move extremities when asked Skin:  . No new issues seen Psychiatric:  . Mental status . Confused, encephalopathic, did repeat "squeeze my hands" but did not follow command.  Did not respond to "what is your name".  I have personally reviewed the following:   Today's Data  . CBG stable . Creatinine stable  Scheduled Meds: . collagenase   Topical Daily  . enoxaparin (LOVENOX) injection  0.5 mg/kg Subcutaneous  Q24H  . [START ON 09/06/2019] methylPREDNISolone (SOLU-MEDROL) injection  20 mg Intravenous Q24H  . senna  1 tablet Oral QHS  . sodium chloride flush  3 mL Intravenous Q12H   Continuous Infusions: . sodium chloride Stopped (09/04/19 1340)    Principal Problem:   Acute respiratory failure due to COVID-19 [facility]) Active Problems:   Pneumonia due to COVID-19 virus   Hyperkalemia   Elevated LFTs   Chronic diastolic CHF (congestive heart failure) ([place])   DM type 2 (diabetes mellitus, type 2) ([place])   Hypothyroidism   Thrombocytopenia ([place])   Essential hypertension   Morbid obesity ([place])   DNR (do not resuscitate) discussion   Palliative care by specialist   Hematuria   LOS: 14 days   How to contact the [facility] Attending or Consulting provider [place] or covering provider during after hours [place], for this patient?  1. Check the care team in [facility] and look for a) attending/consulting TRH provider listed and b) the [facility] team listed 2. Log into [email or web address] and use [place]'s universal password to access. If you do not have the password, please contact the hospital operator. 3. Locate the [facility] provider you are looking for under [facility] and page to a number that you can be directly reached. 4. If you still have difficulty reaching the provider, please page the [facility] (Director on Call) for the Hospitalists listed on amion for  assistance.

## 2019-09-05 NOTE — Progress Notes (Signed)
   09/05/19 0600  Assess: MEWS Score  ECG Heart Rate 72  Resp (!) 34  Assess: MEWS Score  MEWS Temp 0  MEWS Systolic 0  MEWS Pulse 0  MEWS RR 2  MEWS LOC 0  MEWS Score 2  MEWS Score Color Yellow  Assess: if the MEWS score is Yellow or Red  Were vital signs taken at a resting state? Yes  Focused Assessment No change from prior assessment  Early Detection of Sepsis Score *See Row Information* Low  MEWS guidelines implemented *See Row Information* No, vital signs rechecked (Pt intermittently tachypneic, will recheck RR)

## 2019-09-06 DIAGNOSIS — R0603 Acute respiratory distress: Secondary | ICD-10-CM

## 2019-09-06 LAB — GLUCOSE, CAPILLARY
Glucose-Capillary: 185 mg/dL — ABNORMAL HIGH (ref 70–99)
Glucose-Capillary: 169 mg/dL — ABNORMAL HIGH (ref 70–99)
Glucose-Capillary: 163 mg/dL — ABNORMAL HIGH (ref 70–99)
Glucose-Capillary: 181 mg/dL — ABNORMAL HIGH (ref 70–99)

## 2019-09-06 MED ORDER — MORPHINE SULFATE (CONCENTRATE) 10 MG/0.5ML PO SOLN
10.0000 mg | ORAL | Status: DC | PRN
  Administered 2019-09-06: 10 mg via SUBLINGUAL
  Filled 2019-09-06: qty 0.5

## 2019-09-06 NOTE — Progress Notes (Signed)
MT called to inform that patient's O2 is in 108s. RN went to assess pts condition. Connected pulse oximeter to pt; reads 93%. Patient is becoming agitated and pulling at nasal cannula/non-rebreather. Also moaning as if in pain. Requested specialized pulse oximeter for earlobe. Connected it to patient and administered pain medication. Cardiac monitor now reads 93-94% with ear oximeter. Will monitor for effectiveness of pain meds.

## 2019-09-06 NOTE — Progress Notes (Signed)
PROGRESS NOTE    Jennifer Mosley  QPY:[id number] DOB: December 20, 1955 DOA: 08/24/2019 PCP: [name], MD  Brief Narrative:  64 year old woman PMH COPD, cirrhosis, CHF presented with shortness of breath, altered mental status.  Admitted for acute hypoxic respiratory failure secondary to COVID-19 pneumonia.  Treated with steroids, remdesivir.  Refused Actemra.  Condition failed to improve, remained with high oxygen requirement.  Seen by palliative care and pulmonology.  Unfortunately little more to offer.  She has remained on high flow nasal cannula with nonrebreather.  She was trialed on BiPAP but ultimately was unable to tolerate it.  DNR.  No intubation.  After further discussion, family decided against BiPAP, no further escalation of care..  At this point the patient continues to require high flow oxygen with nonrebreather and does not appear that she will recover from Covid.  Expect in-hospital death.  Unfortunately hospice not accepting Covid patients.  Daily discussions with daughter-in-law for goals of care.  Not yet comfort care  8/25: Patient seen and examined.  Remains markedly hypoxic on 50L high flow in addition to nonrebreather mask.  Does not participate in interview.  Groans.  Appears uncomfortable and in pain.  Prognosis remains poor.   Assessment & Plan:   Principal Problem:   Acute respiratory failure due to COVID-19 [facility]) Active Problems:   Essential hypertension   Morbid obesity ([place])   DNR (do not resuscitate) discussion   Pneumonia due to COVID-19 virus   Palliative care by specialist   Chronic diastolic CHF (congestive heart failure) ([place])   DM type 2 (diabetes mellitus, type 2) (HCC)   Hypothyroidism   Thrombocytopenia (HCC)   Hyperkalemia   Elevated LFTs   Hematuria  Acute respiratory failure due to COVID-19 [facility]) --Still tolerating high flow nasal cannula nonrebreather.  Worsening daily.  Encephalopathic.  Not following commands. --Completed ceftriaxone,  remdesivir, azithromycin.  Refused Actemra. --Decreased Solu-Medrol.  Will continue to taper --Appreciate palliative medicine and pulmonology consultations earlier in hospitalization --No BiPAP or escalation of care. --Prognosis grim --Morphine as needed for pain and air hunger --Agree with Dr.Goodrich.  Recommend transition to comfort measures  Chronic diastolic CHF (congestive heart failure) (HCC) --Echocardiogram normal LVEF.  Unable to determine diastolic function. --BNP was unremarkable. --Fluid balance negative.  Minimal lower extremity edema.  DM type 2 (diabetes mellitus, type 2) (HCC) Hemoglobin A1c 7.6. --CBG remains stable.  Stopped sliding scale insulin.  Stopped Tradjenta and long-acting insulin given no significant oral intake  Hypothyroidism Hypothyroidism --will stop levothyroxine based on goals of care  Thrombocytopenia (HCC) --labile, secondary to acute illness.  Goals of care with daughter-in-law, no further testing.  Pneumonia due to COVID-19 virus Plan as above --No escalation of care  Morbid obesity ([place]) --complicating acute illness. Recommendations per dietician.  Hyperkalemia --Spurious with hemolysis noted. --Discussed with family, no further investigation or laboratory studies.  Elevated LFTs --AST/ALT up >1000 rapidly. Discussed w/ pharmacy, etiology unclear; no offending meds currently; could be ADR to remdesivir although finished 8/14 would make this a delayed rxn. Has not been hypotensive. asymptomatic --Statin stopped --RUQ u/s 8/14 showed cirrhosis, gallbladder sludge --LFTs trending down.  Total bilirubin was stable..Discussed in detail with family member.  I think this is most likely related to acute illness, congestive hepatopathy or cholestasis from critical illness.  No clear signs or symptoms of acute gallbladder disease.  We discussed further imaging and work-up, family declines any further work-up.  They understand the  differential and uncertainty.  Hematuria --Etiology unclear, no  further testing per family.  Lactic acidosis.  Sepsis ruled out.  SIRS response secondary to Covid.  Was treated with empiric antibiotics but no definite bacterial infection discovered   DVT prophylaxis: Lovenox Code Status: DNR/DNI Family Communication:  Disposition Plan: Status is: Inpatient  Remains inpatient appropriate because:Inpatient level of care appropriate due to severity of illness   Dispo: The patient is from: Home              Anticipated d/c is to: TBD              Anticipated d/c date is: > 3 days              Patient currently is not medically stable to d/c.  Patient is markedly hypoxic and encephalopathic in the setting of respiratory failure from COVID-19 infection.  At this point prognosis remains very poor for any chance of a meaningful recovery.  Continue to recommend transition to comfort measures.  At this time continuing current scope of treatment without any escalation of care.  I spoke with patient's daughter-in-law via phone.  She is healthcare power of attorney.  She will be meeting with patient's family members at approximately 8 PM tonight to discuss goals of care and possible transition to comfort measures.  Will touch base with daughter-in-law again tomorrow for further conversation.    Consultants:   None  Procedures:   None  Antimicrobials:   None   Subjective: Seen and examined.  Unable to participate in interview.  Moans intermittently.  Appears in pain  Objective: Vitals:   09/06/19 0811 09/06/19 1049 09/06/19 1220 09/06/19 1441  BP: (!) 147/88  (!) 157/96 (!) 150/95  Pulse: 77  76 79  Resp: 17  18   Temp: (!) 97.5 F (36.4 C)  98.4 F (36.9 C) 98.2 F (36.8 C)  TempSrc: Oral  Oral Oral  SpO2: 92% 94% 94% 95%  Weight:      Height:        Intake/Output Summary (Last 24 hours) at 09/06/2019 1504 Last data filed at 09/06/2019 0700 Gross per 24 hour  Intake 0  ml  Output 1050 ml  Net -1050 ml   Filed Weights   09/03/19 0606 09/04/19 0525 09/05/19 0349  Weight: 100.5 kg 102.3 kg 102.6 kg    Examination:  General exam: Appears encephalopathic, confused, critically ill Respiratory system: Bibasilar crackles.  Normal work of breathing.  Normal respiratory effort Cardiovascular system: S1 & S2 heard, RRR. No JVD, murmurs, rubs, gallops or clicks.  1+ pitting edema bilaterally Gastrointestinal system: Abdomen is nondistended, soft and nontender. No organomegaly or masses felt. Normal bowel sounds heard. Central nervous system: Unable to assess orientation.  No obvious focal deficits. Extremities: Unable to assess power level.  Does not follow commands Skin: No rashes, lesions or ulcers Psychiatry: Encephalopathic.  Unable to participate in interview.  Did not follow commands    Data Reviewed: I have personally reviewed following labs and imaging studies  CBC: Recent Labs  Lab 08/31/19 0544 09/01/19 0848 09/02/19 1051 09/03/19 1053  WBC 17.9* 12.4* 12.1* 14.8*  NEUTROABS  --  11.3* 11.0* 12.7*  HGB 15.8* 14.9 16.2* 15.7*  HCT 48.9* 46.2* 49.5* 46.2*  MCV 98.2 95.3 95.0 93.0  PLT 102* 88* 119* 92*   Basic Metabolic Panel: Recent Labs  Lab 08/31/19 0544 08/31/19 0544 09/01/19 0848 09/02/19 1051 09/02/19 1645 09/03/19 0425 09/05/19 0717  NA 140  --  142 143 142 145  --  K 5.5*  --  5.6* 6.0* 5.0 5.6*  --   CL 101  --  107 110 108 112*  --   CO2 28  --  28 22 23 26   --   GLUCOSE 137*  --  149* 210* 295* 209*  --   BUN 41*  --  43* 40* 45* 46*  --   CREATININE 1.21*   < > 1.13* 0.99 0.88 1.07* 0.98  CALCIUM 8.4*  --  7.4* 7.8* 7.6* 8.0*  --    < > = values in this interval not displayed.   GFR: Estimated Creatinine Clearance: 69.4 mL/min (by C-G formula based on SCr of 0.98 mg/dL). Liver Function Tests: Recent Labs  Lab 08/31/19 0544 09/01/19 0848 09/02/19 1051 09/02/19 1645 09/03/19 0425  AST 386* 2,065* 845* 654*  463*  ALT 170* 1,113* 804* 738* 631*  ALKPHOS 262* 344* 382* 363* 371*  BILITOT 2.2* 2.2* 2.9* 2.9* 3.0*  PROT 6.9 6.3* 6.5 6.4* 6.5  ALBUMIN 1.7* 1.6* 1.7* 1.6* 1.7*   No results for input(s): LIPASE, AMYLASE in the last 168 hours. No results for input(s): AMMONIA in the last 168 hours. Coagulation Profile: No results for input(s): INR, PROTIME in the last 168 hours. Cardiac Enzymes: No results for input(s): CKTOTAL, CKMB, CKMBINDEX, TROPONINI in the last 168 hours. BNP (last 3 results) No results for input(s): PROBNP in the last 8760 hours. HbA1C: No results for input(s): HGBA1C in the last 72 hours. CBG: Recent Labs  Lab 09/05/19 1221 09/05/19 1727 09/05/19 2255 09/06/19 0808 09/06/19 1212  GLUCAP 156* 141* 210* 185* 169*   Lipid Profile: No results for input(s): CHOL, HDL, LDLCALC, TRIG, CHOLHDL, LDLDIRECT in the last 72 hours. Thyroid Function Tests: No results for input(s): TSH, T4TOTAL, FREET4, T3FREE, THYROIDAB in the last 72 hours. Anemia Panel: No results for input(s): VITAMINB12, FOLATE, FERRITIN, TIBC, IRON, RETICCTPCT in the last 72 hours. Sepsis Labs: No results for input(s): PROCALCITON, LATICACIDVEN in the last 168 hours.  No results found for this or any previous visit (from the past 240 hour(s)).       Radiology Studies: No results found.      Scheduled Meds: . collagenase   Topical Daily  . enoxaparin (LOVENOX) injection  0.5 mg/kg Subcutaneous Q24H  . methylPREDNISolone (SOLU-MEDROL) injection  20 mg Intravenous Q24H  . senna  1 tablet Oral QHS  . sodium chloride flush  3 mL Intravenous Q12H   Continuous Infusions: . sodium chloride Stopped (09/04/19 1340)     LOS: 15 days    Time spent: 25 minutes    Sidney Ace, MD [facility] Pager [phone]  If 7PM-7AM, please contact night-coverage 09/06/2019, 3:04 PM

## 2019-09-06 NOTE — Progress Notes (Signed)
Updated family on pt condition and face time daughter in law [name])and son.

## 2019-09-07 LAB — GLUCOSE, CAPILLARY
Glucose-Capillary: 178 mg/dL — ABNORMAL HIGH (ref 70–99)
Glucose-Capillary: 170 mg/dL — ABNORMAL HIGH (ref 70–99)
Glucose-Capillary: 144 mg/dL — ABNORMAL HIGH (ref 70–99)
Glucose-Capillary: 159 mg/dL — ABNORMAL HIGH (ref 70–99)

## 2019-09-07 NOTE — Progress Notes (Signed)
PROGRESS NOTE    Jennifer Mosley  WUJ:[id number] DOB: January 03, 1956 DOA: 09/08/2019 PCP: [name], MD  Brief Narrative:  64 year old woman PMH COPD, cirrhosis, CHF presented with shortness of breath, altered mental status.  Admitted for acute hypoxic respiratory failure secondary to COVID-19 pneumonia.  Treated with steroids, remdesivir.  Refused Actemra.  Condition failed to improve, remained with high oxygen requirement.  Seen by palliative care and pulmonology.  Unfortunately little more to offer.  She has remained on high flow nasal cannula with nonrebreather.  She was trialed on BiPAP but ultimately was unable to tolerate it.  DNR.  No intubation.  After further discussion, family decided against BiPAP, no further escalation of care..  At this point the patient continues to require high flow oxygen with nonrebreather and does not appear that she will recover from Covid.  Expect in-hospital death.  Unfortunately hospice not accepting Covid patients.  Daily discussions with daughter-in-law for goals of care.  Not yet comfort care  8/25: Patient seen and examined.  Remains markedly hypoxic on 50L high flow in addition to nonrebreather mask.  Does not participate in interview.  Groans.  Appears uncomfortable and in pain.  Prognosis remains poor.  8/26: Patient seen and examined.  Markedly hypoxic.  Level of orientation and mentation decreasing.  Unable to follow commands.  Unable to keep her arms up independently.  Unable to eat reliably.  Communicated with daughter-in-law who is healthcare power of attorney.  Expressed the gravity of the situation.  Daughter-in-law is understanding and appreciative of care but at this point unable to come to a decision in regards to comfort measures.   Assessment & Plan:   Principal Problem:   Acute respiratory failure due to COVID-19 [facility]) Active Problems:   Essential hypertension   Morbid obesity ([place])   DNR (do not resuscitate) discussion   Pneumonia  due to COVID-19 virus   Palliative care by specialist   Chronic diastolic CHF (congestive heart failure) ([place])   DM type 2 (diabetes mellitus, type 2) (HCC)   Hypothyroidism   Thrombocytopenia (HCC)   Hyperkalemia   Elevated LFTs   Hematuria   Acute respiratory distress  Acute respiratory failure due to COVID-19 [facility]) --Still tolerating high flow nasal cannula nonrebreather.  Worsening daily.  Encephalopathic.  Not following commands. --Completed ceftriaxone, remdesivir, azithromycin.  Refused Actemra. --Decreased Solu-Medrol.  Will continue to taper --Appreciate palliative medicine and pulmonology consultations earlier in hospitalization --No BiPAP or escalation of care. --Prognosis grim --Morphine as needed for pain and air hunger --Communicated with daughter-in-law who is healthcare power of attorney.  At this time family is unable to make a decision regarding comfort measures.  We will continue with current scope of treatment for now.  Per bedside RN charge nurse confirms that once the patient is comfort measures the patient can have up to 4 visitors a day.  I have also put out a call to palliative care service will follow up with the patient and daughter-in-law tomorrow in order to continue this conversation.  Chronic diastolic CHF (congestive heart failure) (HCC) --Echocardiogram normal LVEF.  Unable to determine diastolic function. --BNP was unremarkable. --Fluid balance negative.  Minimal lower extremity edema.  DM type 2 (diabetes mellitus, type 2) (HCC) Hemoglobin A1c 7.6. --CBG remains stable.  Stopped sliding scale insulin.  Stopped Tradjenta and long-acting insulin given no significant oral intake  Hypothyroidism Hypothyroidism --will stop levothyroxine based on goals of care  Thrombocytopenia (HCC) --labile, secondary to acute illness.  Goals  of care with daughter-in-law, no further testing.  Pneumonia due to COVID-19 virus Plan as above --No escalation of  care  Morbid obesity ([place]) --complicating acute illness. Recommendations per dietician.  Hyperkalemia --Spurious with hemolysis noted. --Discussed with family, no further investigation or laboratory studies.  Elevated LFTs --AST/ALT up >1000 rapidly. Discussed w/ pharmacy, etiology unclear; no offending meds currently; could be ADR to remdesivir although finished 8/14 would make this a delayed rxn. Has not been hypotensive. asymptomatic --Statin stopped --RUQ u/s 8/14 showed cirrhosis, gallbladder sludge --LFTs trending down.  Total bilirubin was stable..Discussed in detail with family member.  I think this is most likely related to acute illness, congestive hepatopathy or cholestasis from critical illness.  No clear signs or symptoms of acute gallbladder disease.  We discussed further imaging and work-up, family declines any further work-up.  They understand the differential and uncertainty.  Hematuria --Etiology unclear, no further testing per family.  Lactic acidosis.  Sepsis ruled out.  SIRS response secondary to Covid.  Was treated with empiric antibiotics but no definite bacterial infection discovered   DVT prophylaxis: Lovenox Code Status: DNR/DNI Family Communication:  Disposition Plan: Status is: Inpatient  Remains inpatient appropriate because:Inpatient level of care appropriate due to severity of illness   Dispo: The patient is from: Home              Anticipated d/c is to: TBD              Anticipated d/c date is: > 3 days              Patient currently is not medically stable to d/c.  Patient remains markedly hypoxic and encephalopathic.  Respiratory failure due to COVID-19.  The chance of a meaningful recovery is minimal to none at this point.  Patient overall level of function continues to slowly decrease on a daily basis.  She did not appear in pain this morning we are treating with morphine for pain and air hunger with seemingly good effect.  However overall  prognosis remains grim.  Contacted palliative care service will follow up with patient and family tomorrow.    Consultants:   None  Procedures:   None  Antimicrobials:   None   Subjective: Seen and examined.  Unable to participate in interview.  Moans intermittently.  Appears in pain  Objective: Vitals:   09/07/19 0855 09/07/19 1132 09/07/19 1428 09/07/19 1518  BP:  132/88  (!) 149/74  Pulse:  78  74  Resp:  17  18  Temp:  98.4 F (36.9 C)  98.1 F (36.7 C)  TempSrc:  Oral  Oral  SpO2: 95% 94% 93% 95%  Weight:      Height:        Intake/Output Summary (Last 24 hours) at 09/07/2019 1532 Last data filed at 09/07/2019 1530 Gross per 24 hour  Intake --  Output 500 ml  Net -500 ml   Filed Weights   09/04/19 0525 09/05/19 0349 09/07/19 0429  Weight: 102.3 kg 102.6 kg 97.3 kg    Examination:  General exam: Appears encephalopathic, confused, critically ill Respiratory system: Bibasilar crackles.  Normal work of breathing.  Normal respiratory effort Cardiovascular system: S1 & S2 heard, RRR. No JVD, murmurs, rubs, gallops or clicks.  1+ pitting edema bilaterally Gastrointestinal system: Abdomen is nondistended, soft and nontender. No organomegaly or masses felt. Normal bowel sounds heard. Central nervous system: Unable to assess orientation.  No obvious focal deficits. Extremities: Unable to assess power level.  Does not follow commands Skin: No rashes, lesions or ulcers Psychiatry: Encephalopathic.  Unable to participate in interview.  Did not follow commands    Data Reviewed: I have personally reviewed following labs and imaging studies  CBC: Recent Labs  Lab 09/01/19 0848 09/02/19 1051 09/03/19 1053  WBC 12.4* 12.1* 14.8*  NEUTROABS 11.3* 11.0* 12.7*  HGB 14.9 16.2* 15.7*  HCT 46.2* 49.5* 46.2*  MCV 95.3 95.0 93.0  PLT 88* 119* 92*   Basic Metabolic Panel: Recent Labs  Lab 09/01/19 0848 09/02/19 1051 09/02/19 1645 09/03/19 0425 09/05/19 0717   NA 142 143 142 145  --   K 5.6* 6.0* 5.0 5.6*  --   CL 107 110 108 112*  --   CO2 28 22 23 26   --   GLUCOSE 149* 210* 295* 209*  --   BUN 43* 40* 45* 46*  --   CREATININE 1.13* 0.99 0.88 1.07* 0.98  CALCIUM 7.4* 7.8* 7.6* 8.0*  --    GFR: Estimated Creatinine Clearance: 67.4 mL/min (by C-G formula based on SCr of 0.98 mg/dL). Liver Function Tests: Recent Labs  Lab 09/01/19 0848 09/02/19 1051 09/02/19 1645 09/03/19 0425  AST 2,065* 845* 654* 463*  ALT 1,113* 804* 738* 631*  ALKPHOS 344* 382* 363* 371*  BILITOT 2.2* 2.9* 2.9* 3.0*  PROT 6.3* 6.5 6.4* 6.5  ALBUMIN 1.6* 1.7* 1.6* 1.7*   No results for input(s): LIPASE, AMYLASE in the last 168 hours. No results for input(s): AMMONIA in the last 168 hours. Coagulation Profile: No results for input(s): INR, PROTIME in the last 168 hours. Cardiac Enzymes: No results for input(s): CKTOTAL, CKMB, CKMBINDEX, TROPONINI in the last 168 hours. BNP (last 3 results) No results for input(s): PROBNP in the last 8760 hours. HbA1C: No results for input(s): HGBA1C in the last 72 hours. CBG: Recent Labs  Lab 09/06/19 1212 09/06/19 1614 09/06/19 2109 09/07/19 0751 09/07/19 1133  GLUCAP 169* 163* 181* 178* 170*   Lipid Profile: No results for input(s): CHOL, HDL, LDLCALC, TRIG, CHOLHDL, LDLDIRECT in the last 72 hours. Thyroid Function Tests: No results for input(s): TSH, T4TOTAL, FREET4, T3FREE, THYROIDAB in the last 72 hours. Anemia Panel: No results for input(s): VITAMINB12, FOLATE, FERRITIN, TIBC, IRON, RETICCTPCT in the last 72 hours. Sepsis Labs: No results for input(s): PROCALCITON, LATICACIDVEN in the last 168 hours.  No results found for this or any previous visit (from the past 240 hour(s)).       Radiology Studies: No results found.      Scheduled Meds: . collagenase   Topical Daily  . enoxaparin (LOVENOX) injection  0.5 mg/kg Subcutaneous Q24H  . methylPREDNISolone (SOLU-MEDROL) injection  20 mg Intravenous  Q24H  . senna  1 tablet Oral QHS  . sodium chloride flush  3 mL Intravenous Q12H   Continuous Infusions: . sodium chloride Stopped (09/04/19 1340)     LOS: 16 days    Time spent: 15 minutes    Sidney Ace, MD [facility] Pager [phone]  If 7PM-7AM, please contact night-coverage 09/07/2019, 3:32 PM

## 2019-09-07 NOTE — Progress Notes (Signed)
Spoke to daughter in law Jennifer Mosley and she was inquiring about the patients purse. I assured her it was not present in the room. I was informed by the secretary that the patient  had visitors on both Saturday and Sunday when they were deciding on care and treatment. Mrs. Jennifer Mosley stated she would check with the other group that visited her on Sunday because she was a part of the Saturday group of individuals.

## 2019-09-07 NOTE — Progress Notes (Signed)
Made another attempt to feed patient lunch pt is unable to chew food. At risk for aspiration.

## 2019-09-07 NOTE — Progress Notes (Signed)
We attempted to feed pt. Breakfast. Pt is responsive to name only, does not follow commands. Unable to feed self or chew food. Food item was removed from mouth to prevent aspiration. Sats  91-92 % on Heated HF of 15 L and non rebreather.

## 2019-09-07 NOTE — Progress Notes (Signed)
End of shift report, Pt is alert and oriented to self. Pt does not follow commands. Several attempts were made today to feed the patient but the patient would not chew any food substances or drink through a straw. Most of the days she is lethargic and shows signs of weakness. Purewick remains in place, with adequate urine output. Dressing change was completed on Abdomen and appears to be healing. Palliative care has been consulted again. Will meet with the family tomorrow.

## 2019-09-08 DIAGNOSIS — R0602 Shortness of breath: Secondary | ICD-10-CM

## 2019-09-08 LAB — GLUCOSE, CAPILLARY
Glucose-Capillary: 135 mg/dL — ABNORMAL HIGH (ref 70–99)
Glucose-Capillary: 146 mg/dL — ABNORMAL HIGH (ref 70–99)
Glucose-Capillary: 131 mg/dL — ABNORMAL HIGH (ref 70–99)
Glucose-Capillary: 104 mg/dL — ABNORMAL HIGH (ref 70–99)

## 2019-09-08 LAB — SARS CORONAVIRUS 2 BY RT PCR (HOSPITAL ORDER, PERFORMED IN ~~LOC~~ HOSPITAL LAB): SARS Coronavirus 2: POSITIVE — AB

## 2019-09-08 NOTE — Progress Notes (Signed)
PROGRESS NOTE    Jennifer Mosley  ZJQ:[id number] DOB: May 11, 1955 DOA: 08/29/2019 PCP: [name], MD  Brief Narrative:  64 year old woman PMH COPD, cirrhosis, CHF presented with shortness of breath, altered mental status.  Admitted for acute hypoxic respiratory failure secondary to COVID-19 pneumonia.  Treated with steroids, remdesivir.  Refused Actemra.  Condition failed to improve, remained with high oxygen requirement.  Seen by palliative care and pulmonology.  Unfortunately little more to offer.  She has remained on high flow nasal cannula with nonrebreather.  She was trialed on BiPAP but ultimately was unable to tolerate it.  DNR.  No intubation.  After further discussion, family decided against BiPAP, no further escalation of care..  At this point the patient continues to require high flow oxygen with nonrebreather and does not appear that she will recover from Covid.  Expect in-hospital death.  Unfortunately hospice not accepting Covid patients.  Daily discussions with daughter-in-law for goals of care.  Not yet comfort care  8/25: Patient seen and examined.  Remains markedly hypoxic on 50L high flow in addition to nonrebreather mask.  Does not participate in interview.  Groans.  Appears uncomfortable and in pain.  Prognosis remains poor.  8/26: Patient seen and examined.  Markedly hypoxic.  Level of orientation and mentation decreasing.  Unable to follow commands.  Unable to keep her arms up independently.  Unable to eat reliably.  Communicated with daughter-in-law who is healthcare power of attorney.  Expressed the gravity of the situation.  Daughter-in-law is understanding and appreciative of care but at this point unable to come to a decision in regards to comfort measures.  8/27: Patient seen and examined.  No appreciable changes.  Responds to name however remains encephalopathic beyond that point.  Seen by palliative care again today.  They reached out to family.  For now plan to  continue with current scope of treatment.   Assessment & Plan:   Principal Problem:   Acute respiratory failure due to COVID-19 [facility]) Active Problems:   Essential hypertension   Morbid obesity (HCC)   DNR (do not resuscitate) discussion   Pneumonia due to COVID-19 virus   Palliative care by specialist   SOB (shortness of breath)   Chronic diastolic CHF (congestive heart failure) ([place])   DM type 2 (diabetes mellitus, type 2) (HCC)   Hypothyroidism   Thrombocytopenia (HCC)   Hyperkalemia   Elevated LFTs   Hematuria   Acute respiratory distress  Acute respiratory failure due to COVID-19 [facility]) --Still tolerating high flow nasal cannula nonrebreather.  Worsening daily.  Encephalopathic.  Not following commands. --Completed ceftriaxone, remdesivir, azithromycin.  Refused Actemra. --Decreased Solu-Medrol.  Will continue to taper --Appreciate palliative medicine and pulmonology consultations earlier in hospitalization --No BiPAP or escalation of care. --Prognosis grim --Morphine as needed for pain and air hunger --Communicated with daughter-in-law who is healthcare power of attorney.  At this time family is unable to make a decision regarding comfort measures.  We will continue with current scope of treatment for now.  Per bedside RN charge nurse confirms that once the patient is comfort measures the patient can have up to 4 visitors a day.  I have also put out a call to palliative care service will follow up with the patient and daughter-in-law tomorrow in order to continue this conversation.  Chronic diastolic CHF (congestive heart failure) (HCC) --Echocardiogram normal LVEF.  Unable to determine diastolic function. --BNP was unremarkable. --Fluid balance negative.  Minimal lower extremity edema.  DM  type 2 (diabetes mellitus, type 2) (HCC) Hemoglobin A1c 7.6. --CBG remains stable.  Stopped sliding scale insulin.  Stopped Tradjenta and long-acting insulin given no significant oral  intake  Hypothyroidism Hypothyroidism --will stop levothyroxine based on goals of care  Thrombocytopenia (HCC) --labile, secondary to acute illness.  Goals of care with daughter-in-law, no further testing.  Pneumonia due to COVID-19 virus Plan as above --No escalation of care  Morbid obesity ([place]) --complicating acute illness. Recommendations per dietician.  Hyperkalemia --Spurious with hemolysis noted. --Discussed with family, no further investigation or laboratory studies.  Elevated LFTs --AST/ALT up >1000 rapidly. Discussed w/ pharmacy, etiology unclear; no offending meds currently; could be ADR to remdesivir although finished 8/14 would make this a delayed rxn. Has not been hypotensive. asymptomatic --Statin stopped --RUQ u/s 8/14 showed cirrhosis, gallbladder sludge --LFTs trending down.  Total bilirubin was stable..Discussed in detail with family member.  I think this is most likely related to acute illness, congestive hepatopathy or cholestasis from critical illness.  No clear signs or symptoms of acute gallbladder disease.  We discussed further imaging and work-up, family declines any further work-up.  They understand the differential and uncertainty.  Hematuria --Etiology unclear, no further testing per family.  Lactic acidosis.  Sepsis ruled out.  SIRS response secondary to Covid.  Was treated with empiric antibiotics but no definite bacterial infection discovered   DVT prophylaxis: Lovenox Code Status: DNR/DNI Family Communication:  Disposition Plan: Status is: Inpatient  Remains inpatient appropriate because:Inpatient level of care appropriate due to severity of illness   Dispo: The patient is from: Home              Anticipated d/c is to: TBD              Anticipated d/c date is: > 3 days              Patient currently is not medically stable to d/c.  Patient remains markedly hypoxic and encephalopathic.  Respiratory failure due to COVID-19.  The  chance of a meaningful recovery is minimal to none at this point.  Patient overall level of function continues to slowly decrease on a daily basis.  She did not appear in pain this morning we are treating with morphine for pain and air hunger with seemingly good effect.  However overall prognosis remains grim.  Family is struggling with the decision.  They would like to inquire about lifting isolation.  I will reach out to infection prevention to review the case.    Consultants:   None  Procedures:   None  Antimicrobials:   None   Subjective: Seen and examined.  Unable to participate in interview.  Moans intermittently.  Appears in pain  Objective: Vitals:   09/08/19 0445 09/08/19 0739 09/08/19 0840 09/08/19 1145  BP: 118/74 (!) 118/58  117/76  Pulse: 79 78  78  Resp: 20 17  17   Temp: 97.8 F (36.6 C) 97.6 F (36.4 C)  97.6 F (36.4 C)  TempSrc: Oral Oral  Oral  SpO2: 94% 94% 97% 96%  Weight: 98.3 kg     Height:        Intake/Output Summary (Last 24 hours) at 09/08/2019 1346 Last data filed at 09/08/2019 1149 Gross per 24 hour  Intake --  Output 650 ml  Net -650 ml   Filed Weights   09/05/19 0349 09/07/19 0429 09/08/19 0445  Weight: 102.6 kg 97.3 kg 98.3 kg    Examination:  General exam: Appears encephalopathic, confused,  critically ill Respiratory system: Bibasilar crackles.  Normal work of breathing.  Normal respiratory effort Cardiovascular system: S1 & S2 heard, RRR. No JVD, murmurs, rubs, gallops or clicks.  1+ pitting edema bilaterally Gastrointestinal system: Abdomen is nondistended, soft and nontender. No organomegaly or masses felt. Normal bowel sounds heard. Central nervous system: Unable to assess orientation.  No obvious focal deficits. Extremities: Unable to assess power level.  Does not follow commands Skin: No rashes, lesions or ulcers Psychiatry: Encephalopathic.  Unable to participate in interview.  Did not follow commands    Data  Reviewed: I have personally reviewed following labs and imaging studies  CBC: Recent Labs  Lab 09/02/19 1051 09/03/19 1053  WBC 12.1* 14.8*  NEUTROABS 11.0* 12.7*  HGB 16.2* 15.7*  HCT 49.5* 46.2*  MCV 95.0 93.0  PLT 119* 92*   Basic Metabolic Panel: Recent Labs  Lab 09/02/19 1051 09/02/19 1645 09/03/19 0425 09/05/19 0717  NA 143 142 145  --   K 6.0* 5.0 5.6*  --   CL 110 108 112*  --   CO2 22 23 26   --   GLUCOSE 210* 295* 209*  --   BUN 40* 45* 46*  --   CREATININE 0.99 0.88 1.07* 0.98  CALCIUM 7.8* 7.6* 8.0*  --    GFR: Estimated Creatinine Clearance: 67.7 mL/min (by C-G formula based on SCr of 0.98 mg/dL). Liver Function Tests: Recent Labs  Lab 09/02/19 1051 09/02/19 1645 09/03/19 0425  AST 845* 654* 463*  ALT 804* 738* 631*  ALKPHOS 382* 363* 371*  BILITOT 2.9* 2.9* 3.0*  PROT 6.5 6.4* 6.5  ALBUMIN 1.7* 1.6* 1.7*   No results for input(s): LIPASE, AMYLASE in the last 168 hours. No results for input(s): AMMONIA in the last 168 hours. Coagulation Profile: No results for input(s): INR, PROTIME in the last 168 hours. Cardiac Enzymes: No results for input(s): CKTOTAL, CKMB, CKMBINDEX, TROPONINI in the last 168 hours. BNP (last 3 results) No results for input(s): PROBNP in the last 8760 hours. HbA1C: No results for input(s): HGBA1C in the last 72 hours. CBG: Recent Labs  Lab 09/07/19 1133 09/07/19 1612 09/07/19 2140 09/08/19 0742 09/08/19 1146  GLUCAP 170* 144* 159* 135* 146*   Lipid Profile: No results for input(s): CHOL, HDL, LDLCALC, TRIG, CHOLHDL, LDLDIRECT in the last 72 hours. Thyroid Function Tests: No results for input(s): TSH, T4TOTAL, FREET4, T3FREE, THYROIDAB in the last 72 hours. Anemia Panel: No results for input(s): VITAMINB12, FOLATE, FERRITIN, TIBC, IRON, RETICCTPCT in the last 72 hours. Sepsis Labs: No results for input(s): PROCALCITON, LATICACIDVEN in the last 168 hours.  No results found for this or any previous visit (from  the past 240 hour(s)).       Radiology Studies: No results found.      Scheduled Meds: . collagenase   Topical Daily  . enoxaparin (LOVENOX) injection  0.5 mg/kg Subcutaneous Q24H  . methylPREDNISolone (SOLU-MEDROL) injection  20 mg Intravenous Q24H  . senna  1 tablet Oral QHS  . sodium chloride flush  3 mL Intravenous Q12H   Continuous Infusions: . sodium chloride Stopped (09/04/19 1340)     LOS: 17 days    Time spent: 15 minutes    Sidney Ace, MD [facility] Pager [phone]  If 7PM-7AM, please contact night-coverage 09/08/2019, 1:46 PM

## 2019-09-08 NOTE — Progress Notes (Signed)
Patient DIL Jennifer Mosley who is POA called regarding patient being on isolation for COVID.  This RN explained to [name] that per IP, the patient must remain on precautions until 8/31 due to her testing positive on 8/10.  Jennifer Mosley states that patient tested positive on 7/31.  This RN reached out to [employer] and was told that if family could provide proof and Elmyra Ricks would verify the results with the county, then the patient could come off isolation.  This RN called Jennifer Mosley back and made her aware.  Plan is for Jennifer Mosley to bring paperwork tomorrow to be verified.

## 2019-09-08 NOTE — Progress Notes (Signed)
Patient ID: Jennifer Mosley, female   DOB: [birth year], 64 y.o.   MRN: [id number]  This NP reviewed medical records received report from team and discussed care with patient's bedside RN/Kelly.  Patient is lethragic minimally responsive, dyspneic and requiring high levels of oxygen to maintain O2 saturations.  Continued  communication with main decision maker Jennifer Mosley/sister-in-law .  Continued education regarding current medical situation.  Patient is high risk for decompensation  Education offered on the difference between an aggressive medical intervention path and a palliative comfort path for this patient at this time in this situation.  Natural  trajectory and expectations at end of life was discussed , education offered on the utilization of medications for comfort at end-of-life.  I discussed with AC/visitation policy for Covid patients at EOL.  I understand that two visitors can see the patient for 15 minutes only.  I relayed this information to family  Questions and concerns addressed   For now plan is to continue with current medical situations, family remains hopeful for improvement.  Family is struggling.  Emotional support offered.    Discussed with Deneise Lever the importance of continued conversation with all family and the medical providers regarding overall plan of care and treatment options,  ensuring decisions are within the context of the patients values and GOCs.  Discussed with Dr  Priscella Mann vis secure chat     PMT will continue to support holistically.  Total time spent on the unit was 35 miutes  Greater than 50% of the time was spent in counseling and coordination of care  Wadie Lessen NP  Palliative Medicine Team Team Phone # [phone] Pager [phone]

## 2019-09-09 MED ORDER — LACTATED RINGERS IV BOLUS
500.0000 mL | Freq: Once | INTRAVENOUS | Status: AC
  Administered 2019-09-09: 500 mL via INTRAVENOUS

## 2019-09-09 MED ORDER — ALBUMIN HUMAN 25 % IV SOLN
25.0000 g | Freq: Once | INTRAVENOUS | Status: DC
  Filled 2019-09-09: qty 100

## 2019-09-09 MED ORDER — ALBUMIN HUMAN 25 % IV SOLN
25.0000 g | Freq: Once | INTRAVENOUS | Status: AC
  Administered 2019-09-09: 25 g via INTRAVENOUS
  Filled 2019-09-09: qty 100

## 2019-09-13 NOTE — Progress Notes (Signed)
Pts belongings given to [place]. Phone, phone charger, and glasses

## 2019-09-13 NOTE — Progress Notes (Signed)
Pt's BP down to 72/38. NP paged. Orders placed. After bolus and albumin. BP 104/23. Pt eyes remains closed. Responds to sternal rub. Will continue to monitor.

## 2019-09-13 NOTE — Progress Notes (Signed)
Pt decompensating, full discussions has been taken with doctors and family regarding comfort care

## 2019-09-13 NOTE — Progress Notes (Signed)
Cross Cover Note Patient with underlying COPD, cirrhosis, and congestive heart failure admitted with acute hypoxic failure secondary to  COVID pneumonia. Hospital course complicated with increasing oxygen requirements and family decided against use of BIPAP or escalation of care.  She continued to decompensate throughout the  Night with low pressures treated with albumin and fluids. Ultimately this was futile and patient passed away 0536  09/14/2019

## 2019-09-13 NOTE — Progress Notes (Signed)
Went to pt room to give fluid bolus due to low BP. When there, noticed pt took last breath and seen pt was deceased at 76. CMD called to notify of asystole on the monitor. Notified Randol Kern NP of pt's passing and she came to pronounce. Tonye Becket, daughter in law, and informed her of circumstances. Deneise Lever and family will be here to view pt. Per [place] donor, pt not a candidate. Refereral number [id number] Bethena Roys). Tele and IV has been removed. Awaiting family.

## 2019-09-13 NOTE — Death Summary Note (Addendum)
Physician Discharge Summary  Jennifer Mosley ENI:[id number] DOB: May 18, 1955 DOA: 08/23/2019  PCP: [name], MD  Admit date: 08/15/2019 Discharge date: 2019/09/18  Admitted From: Home Disposition:  In hospital death  Discharge Condition:In hospital death CODE STATUS:DNR  Brief/Interim Summary: Patient went had an extended hospital stay totaling 17 days.  Severe Covid pneumonia and resultant severe acute hypoxic respiratory failure.  Patient received full course of treatment with remdesivir, Decadron, Versed.  Her saturations did not respond and gradually her hemodynamics worsened.  Patient also had severe and persistent encephalopathy, likely multifactorial in nature.  Patient completed a full course of treatment with steroids and remdesivir.  She was offered Actemra and refused.  Condition failed to improve.  Oxygen requirement was persistent.  Seen by palliative care and pulmonology.  Trialed on BiPAP was unable to tolerate.  Made a DNR status at this time.  After further discussion with the family decision was made to not further escalate care.  Patient was maintained on high flow oxygen and nonrebreather.  Patient had a medical history including chronic diastolic congestive heart failure, type 2 diabetes mellitus, hypothyroidism, morbid obesity.  We will keep family updated and despite repeat conversations detailing the severity of her condition the wanted to do what was best for the family member and reluctant to proceed with full comfort measures.  Discontinued scope of treatment without escalation.  Patient did not respond to therapies unfortunately.  On 09-18-2019 patient became markedly hypotensive.  Attempted fluid resuscitation however the patient became unresponsive early this morning.  RN went in the room and found the patient to be deceased.  Overnight cross cover called to evaluate and pronounce patient.  Family members were called to bedside.  Official time of death 21 on  09-18-2019  Discharge Diagnoses:  Principal Problem:   Acute respiratory failure due to COVID-19 [facility]) Active Problems:   Essential hypertension   Morbid obesity ([place])   DNR (do not resuscitate) discussion   Pneumonia due to COVID-19 virus   Palliative care by specialist   SOB (shortness of breath)   Chronic diastolic CHF (congestive heart failure) ([place])   DM type 2 (diabetes mellitus, type 2) (HCC)   Hypothyroidism   Thrombocytopenia (HCC)   Hyperkalemia   Elevated LFTs   Hematuria   Acute respiratory distress  Acute respiratory failure due to COVID-19 [facility]) Patient completed a full course of treatment for COVID-19 pneumonia.  Despite treatment with remdesivir, steroids, baricitinib, supplemental oxygen the patient's respiratory status and overall clinical status continued to worsen.  Repeated and daily discussions with palliative care and patient's family to discuss clinical situation.  There were struggling with the decision regarding comfort care and unfortunately were unable to do so.  We pursued aggressive care without escalation throughout hospital course.  Chronic diastolic CHF (congestive heart failure) (HCC) --Echocardiogram normal LVEF. Unable to determine diastolic function. --BNP was unremarkable. --Fluid balance negative. Minimal lower extremity edema.  DM type 2 (diabetes mellitus, type 2) (HCC) Hemoglobin A1c 7.6. CBG stable on sliding scale during hospitalization  Hypothyroidism Hypothyroidism --will stoplevothyroxinebased on goals of care  Thrombocytopenia (HCC) --labile, secondary to acute illness.Goals of care with daughter-in-law, no further testing.  Pneumonia due to COVID-19 virus Plan as above --No escalation of care  Morbid obesity ([place]) --complicating acute illness. Recommendations per dietician.  Hyperkalemia --Spurious with hemolysis noted. --Discussed with family, no further investigation or laboratory studies.  Elevated  LFTs --AST/ALT up >1000rapidly. Discussed w/ pharmacy, etiology unclear; no offending meds currently; could  be ADR to remdesivir although finished 8/14 would make this a delayed rxn. Has not been hypotensive. asymptomatic --Statin stopped --RUQ u/s 8/14 showed cirrhosis, gallbladder sludge --LFTstrendingdown. Total bilirubin was stable..Discussed in detail with family member. I think this is most likely related to acute illness, congestive hepatopathyor cholestasis from critical illness. No clear signs or symptoms of acute gallbladder disease. We discussed further imaging and work-up, family declines any further work-up. They understand the differential and uncertainty.  Hematuria --Etiology unclear, no further testing per family.  Lactic acidosis. Sepsis ruled out. SIRS response secondary to Covid. Was treated with empiric antibiotics but no definite bacterial infection discovered  Discharge Instructions   Allergies as of 09/21/2019   No Known Allergies     Medication List    ASK your doctor about these medications   aspirin 81 MG tablet Take 81 mg by mouth daily.   Calcium 600-D 600-400 MG-UNIT Tabs Generic drug: Calcium Carbonate-Vitamin D3 Take 1 tablet by mouth in the morning and at bedtime.   enalapril 20 MG tablet Commonly known as: VASOTEC Take 20 mg by mouth daily. Ask about: Which instructions should I use?   Flovent HFA 220 MCG/ACT inhaler Generic drug: fluticasone Inhale 2 puffs into the lungs daily as needed (shortness of breath).   furosemide 20 MG tablet Commonly known as: LASIX Take 1 tablet (20 mg total) by mouth as needed. For Lower extremity edema.   ibuprofen 800 MG tablet Commonly known as: ADVIL Take 800 mg by mouth every 8 (eight) hours as needed for mild pain or moderate pain.   levothyroxine 88 MCG tablet Commonly known as: SYNTHROID Take 88 mcg by mouth daily.   magnesium oxide 400 MG tablet Commonly known as: MAG-OX Take 400  mg by mouth 2 (two) times daily.   nadolol 20 MG tablet Commonly known as: Corgard Take 1 tablet (20 mg total) by mouth daily.   nitroGLYCERIN 0.4 MG SL tablet Commonly known as: NITROSTAT Place 1 tablet (0.4 mg total) under the tongue every 5 (five) minutes as needed for chest pain.   oxybutynin 10 MG 24 hr tablet Commonly known as: DITROPAN-XL Take 10 mg by mouth daily.   ProAir HFA 108 (90 Base) MCG/ACT inhaler Generic drug: albuterol Inhale 2 puffs into the lungs every 4 (four) hours as needed for wheezing or shortness of breath.   rosuvastatin 20 MG tablet Commonly known as: CRESTOR Take 1 tablet (20 mg total) by mouth daily.   Tradjenta 5 MG Tabs tablet Generic drug: linagliptin Take 5 mg by mouth daily.       No Known Allergies  Consultations:  Palliative care   Procedures/Studies: CT ANGIO CHEST PE W OR WO CONTRAST  Result Date: 08/24/2019 CLINICAL DATA:  Respiratory failure, COPD.  COVID. EXAM: CT ANGIOGRAPHY CHEST WITH CONTRAST TECHNIQUE: Multidetector CT imaging of the chest was performed using the standard protocol during bolus administration of intravenous contrast. Multiplanar CT image reconstructions and MIPs were obtained to evaluate the vascular anatomy. CONTRAST:  18mL OMNIPAQUE IOHEXOL 350 MG/ML SOLN COMPARISON:  None. FINDINGS: Cardiovascular: No filling defects in the pulmonary arteries to suggest pulmonary emboli. Cardiomegaly. Aorta normal caliber. Coronary artery and aortic calcifications. Mediastinum/Nodes: No mediastinal, hilar, or axillary adenopathy. Trachea and esophagus are unremarkable. Thyroid unremarkable. Lungs/Pleura: Extensive ground-glass opacities throughout the lungs most compatible with COVID pneumonia. No effusions. Upper Abdomen: Imaging into the upper abdomen demonstrates no acute findings. Musculoskeletal: Chest wall soft tissues are unremarkable. No acute bony abnormality. Review of the MIP  images confirms the above findings.  IMPRESSION: No evidence of pulmonary embolus. Extensive bilateral ground-glass airspace opacities most compatible with COVID pneumonia. Cardiomegaly, coronary artery disease. Aortic Atherosclerosis (ICD10-I70.0). Electronically Signed   By: Rolm Baptise M.D.   On: 08/24/2019 21:21   DG Chest Port 1 View  Result Date: 09/03/2019 CLINICAL DATA:  COVID-19 pneumonia EXAM: PORTABLE CHEST 1 VIEW COMPARISON:  08/24/2019 chest radiograph. FINDINGS: Stable cardiomediastinal silhouette with mild cardiomegaly. No pneumothorax. No pleural effusion. Extensive patchy opacities throughout both lungs, slightly worsened in the mid lungs. IMPRESSION: Extensive patchy opacities throughout both lungs, slightly worsened in the mid lungs, compatible with COVID-19 pneumonia. Electronically Signed   By: Ilona Sorrel M.D.   On: 09/03/2019 10:57   DG Chest Port 1 View  Result Date: 08/24/2019 CLINICAL DATA:  Shortness of breath. EXAM: PORTABLE CHEST 1 VIEW COMPARISON:  08/26/2019 FINDINGS: The cardiac silhouette, mediastinal and hilar contours are stable. Persistent diffuse interstitial and airspace process in the lungs. No pleural effusions. No pneumothorax. IMPRESSION: Persistent diffuse interstitial and airspace process. Electronically Signed   By: Marijo Sanes M.D.   On: 08/24/2019 05:36   DG Chest Port 1 View  Result Date: 08/13/2019 CLINICAL DATA:  Respiratory distress/shortness of breath EXAM: PORTABLE CHEST 1 VIEW COMPARISON:  March 21, 2017 FINDINGS: There is cardiomegaly with pulmonary venous hypertension. There is interstitial edema. There is no airspace opacity. No adenopathy appreciable. No bone lesions. IMPRESSION: Cardiomegaly with pulmonary vascular congestion. Interstitial edema. Appearance is felt to be indicative of a degree of congestive heart failure. No airspace consolidation. Electronically Signed   By: Lowella Grip III M.D.   On: 09/02/2019 12:07   DG Abd Portable 1V  Result Date:  09/03/2019 CLINICAL DATA:  Generalized abdominal EXAM: PORTABLE ABDOMEN - 1 VIEW COMPARISON:  None. FINDINGS: No dilated small bowel loops. Mild colonic stool. No evidence of pneumatosis or pneumoperitoneum. No radiopaque nephrolithiasis. Patchy opacities at both lung bases. IMPRESSION: Nonobstructive bowel gas pattern. Mild colonic stool. Patchy opacities at both lung bases, correlate with concurrent chest radiograph report. Electronically Signed   By: Ilona Sorrel M.D.   On: 09/03/2019 10:58   ECHOCARDIOGRAM COMPLETE  Result Date: 08/23/2019    ECHOCARDIOGRAM REPORT   Patient Name:   FRONNIE URTON Date of Exam: 08/23/2019 Medical Rec #:  [id number]          Height:       64.0 in Accession #:    [id number]         Weight:       238.3 lb Date of Birth:  [birth year]           BSA:          2.108 m Patient Age:    74 years           BP:           138/78 mmHg Patient Gender: F                  HR:           53 bpm. Exam Location:  [facility] Procedure: 2D Echo, Cardiac Doppler and Color Doppler Indications:     CHF- acute diastolic 932.67  History:         Patient has prior history of Echocardiogram examinations, most                  recent 05/16/2015. COPD and Stroke; Risk Factors:Diabetes.  Sonographer:     Sherrie Sport  RDCS (AE) Referring Phys:  [id number] Royce Macadamia AGBATA Diagnosing Phys: Kate Sable MD  Sonographer Comments: No apical window, no subcostal window and Technically challenging study due to limited acoustic windows. IMPRESSIONS  1. Left ventricular ejection fraction, by estimation, is 60 to 65%. The left ventricle has normal function. The left ventricle has no regional wall motion abnormalities. There is mild left ventricular hypertrophy. Left ventricular diastolic function could not be evaluated.  2. Right ventricular systolic function is normal. The right ventricular size is normal. There is normal pulmonary artery systolic pressure.  3. The mitral valve is normal in structure. No evidence of mitral  valve regurgitation. No evidence of mitral stenosis.  4. The aortic valve is normal in structure. Aortic valve regurgitation is not visualized. No aortic stenosis is present.  5. The inferior vena cava is normal in size with greater than 50% respiratory variability, suggesting right atrial pressure of 3 mmHg. FINDINGS  Left Ventricle: Left ventricular ejection fraction, by estimation, is 60 to 65%. The left ventricle has normal function. The left ventricle has no regional wall motion abnormalities. The left ventricular internal cavity size was normal in size. There is  mild left ventricular hypertrophy. Left ventricular diastolic function could not be evaluated. Right Ventricle: The right ventricular size is normal. No increase in right ventricular wall thickness. Right ventricular systolic function is normal. There is normal pulmonary artery systolic pressure. The tricuspid regurgitant velocity is 1.57 m/s, and  with an assumed right atrial pressure of 10 mmHg, the estimated right ventricular systolic pressure is 66.2 mmHg. Left Atrium: Left atrial size was normal in size. Right Atrium: Right atrial size was normal in size. Pericardium: There is no evidence of pericardial effusion. Mitral Valve: The mitral valve is normal in structure. Normal mobility of the mitral valve leaflets. No evidence of mitral valve regurgitation. No evidence of mitral valve stenosis. Tricuspid Valve: The tricuspid valve is normal in structure. Tricuspid valve regurgitation is not demonstrated. No evidence of tricuspid stenosis. Aortic Valve: The aortic valve is normal in structure. Aortic valve regurgitation is not visualized. No aortic stenosis is present. Pulmonic Valve: The pulmonic valve was not well visualized. Pulmonic valve regurgitation is not visualized. No evidence of pulmonic stenosis. Aorta: The aortic root is normal in size and structure. Venous: The inferior vena cava is normal in size with greater than 50% respiratory  variability, suggesting right atrial pressure of 3 mmHg. IAS/Shunts: No atrial level shunt detected by color flow Doppler.  LEFT VENTRICLE PLAX 2D LVIDd:         3.94 cm LVIDs:         2.78 cm LV PW:         1.13 cm LV IVS:        1.25 cm LVOT diam:     2.00 cm LVOT Area:     3.14 cm  LEFT ATRIUM         Index LA diam:    3.70 cm 1.76 cm/m                        PULMONIC VALVE AORTA                 PV Vmax:        0.80 m/s Ao Root diam: 2.50 cm PV Peak grad:   2.6 mmHg                       RVOT Peak  grad: 4 mmHg  TRICUSPID VALVE TR Peak grad:   9.9 mmHg TR Vmax:        157.00 cm/s  SHUNTS Systemic Diam: 2.00 cm Kate Sable MD Electronically signed by Kate Sable MD Signature Date/Time: 08/23/2019/3:03:17 PM    Final    US Abdomen Limited RUQ  Result Date: 08/26/2019 CLINICAL DATA:  Abnormal LFTs. EXAM: ULTRASOUND ABDOMEN LIMITED RIGHT UPPER QUADRANT COMPARISON:  05/03/2019 FINDINGS: Gallbladder: No gallstones or wall thickening visualized. Suggestion of mild gallbladder sludge. No sonographic Murphy sign noted by sonographer. Common bile duct: Diameter: 5.4 mm. Liver: Increased hazy parenchymal echogenicity with nodular contour suggesting a degree of cirrhosis and unchanged. No focal mass. Portal vein is patent on color Doppler imaging with normal direction of blood flow towards the liver. Other: No free fluid. IMPRESSION: 1. No acute findings. 2. Evidence of cirrhosis and unchanged. 3. Possible mild gallbladder sludge. Electronically Signed   By: Marin Olp M.D.   On: 08/26/2019 11:47    (Echo, Carotid, EGD, Colonoscopy, ERCP)    Subjective: Seen and examined the day prior to in hospital death.  Patient was encephalopathic.  Unable to follow commands.  Dependent on high rate oxygen.  Unfortunately patient passed away early morning on 16-Sep-2019 prior to my evaluation  Discharge Exam: Vitals:   2019/09/16 0454 09-16-19 0536  BP:    Pulse:    Resp: (!) 23 (!) 0  Temp:    SpO2:      Vitals:   09/16/2019 0449 09/16/19 0454 09/16/19 0536 09/16/2019 0600  BP:      Pulse:      Resp: (!) 24 (!) 23 (!) 0   Temp:      TempSrc:      SpO2:      Weight:    98.5 kg  Height:    5\' 4"  (1.626 m)    No discharge exam performed this patient passed away prior to my evaluation on 2019/09/16    The results of significant diagnostics from this hospitalization (including imaging, microbiology, ancillary and laboratory) are listed below for reference.     Microbiology: No results found for this or any previous visit (from the past 240 hour(s)).   Labs: BNP (last 3 results) Recent Labs    08/13/2019 1140  BNP 774.1*   Basic Metabolic Panel: No results for input(s): NA, K, CL, CO2, GLUCOSE, BUN, CREATININE, CALCIUM, MG, PHOS in the last 168 hours. Liver Function Tests: No results for input(s): AST, ALT, ALKPHOS, BILITOT, PROT, ALBUMIN in the last 168 hours. No results for input(s): LIPASE, AMYLASE in the last 168 hours. No results for input(s): AMMONIA in the last 168 hours. CBC: No results for input(s): WBC, NEUTROABS, HGB, HCT, MCV, PLT in the last 168 hours. Cardiac Enzymes: No results for input(s): CKTOTAL, CKMB, CKMBINDEX, TROPONINI in the last 168 hours. BNP: Invalid input(s): POCBNP CBG: No results for input(s): GLUCAP in the last 168 hours. D-Dimer No results for input(s): DDIMER in the last 72 hours. Hgb A1c No results for input(s): HGBA1C in the last 72 hours. Lipid Profile No results for input(s): CHOL, HDL, LDLCALC, TRIG, CHOLHDL, LDLDIRECT in the last 72 hours. Thyroid function studies No results for input(s): TSH, T4TOTAL, T3FREE, THYROIDAB in the last 72 hours.  Invalid input(s): FREET3 Anemia work up No results for input(s): VITAMINB12, FOLATE, FERRITIN, TIBC, IRON, RETICCTPCT in the last 72 hours. Urinalysis    Component Value Date/Time   COLORURINE AMBER (A) 09/02/2019 2057   APPEARANCEUR CLOUDY (A)  09/02/2019 2057   LABSPEC 1.029 09/02/2019  2057   PHURINE 6.0 09/02/2019 2057   GLUCOSEU >=500 (A) 09/02/2019 2057   HGBUR LARGE (A) 09/02/2019 2057   BILIRUBINUR NEGATIVE 09/02/2019 2057   KETONESUR NEGATIVE 09/02/2019 2057   PROTEINUR 100 (A) 09/02/2019 2057   NITRITE NEGATIVE 09/02/2019 2057   LEUKOCYTESUR NEGATIVE 09/02/2019 2057   Sepsis Labs Invalid input(s): PROCALCITONIN,  WBC,  LACTICIDVEN Microbiology No results found for this or any previous visit (from the past 240 hour(s)).   Time coordinating discharge: Over 30 minutes  SIGNED:   Sidney Ace, MD  [facility] 09/20/2019, 2:14 PM Pager   If 7PM-7AM, please contact night-coverage

## 2019-09-13 DEATH — deceased

## 2019-09-15 ENCOUNTER — Other Ambulatory Visit: Payer: Self-pay | Admitting: Internal Medicine

## 2019-09-15 DIAGNOSIS — R079 Chest pain, unspecified: Secondary | ICD-10-CM

## 2019-10-11 ENCOUNTER — Ambulatory Visit: Payer: Medicare Other | Admitting: Internal Medicine
